# Patient Record
Sex: Female | Born: 1935 | Race: White | Hispanic: No | Smoking: Never smoker
Health system: Southern US, Community
[De-identification: ages and names within clinical notes are randomized; demographics above are authoritative.]

## PROBLEM LIST (undated history)

## (undated) DIAGNOSIS — Z8673 Personal history of transient ischemic attack (TIA), and cerebral infarction without residual deficits: Secondary | ICD-10-CM

## (undated) DIAGNOSIS — N393 Stress incontinence (female) (male): Secondary | ICD-10-CM

## (undated) DIAGNOSIS — S2341XA Sprain of ribs, initial encounter: Secondary | ICD-10-CM

## (undated) DIAGNOSIS — I951 Orthostatic hypotension: Secondary | ICD-10-CM

## (undated) DIAGNOSIS — E785 Hyperlipidemia, unspecified: Secondary | ICD-10-CM

## (undated) DIAGNOSIS — F0390 Unspecified dementia without behavioral disturbance: Secondary | ICD-10-CM

## (undated) DIAGNOSIS — F0280 Dementia in other diseases classified elsewhere without behavioral disturbance: Secondary | ICD-10-CM

## (undated) DIAGNOSIS — M25519 Pain in unspecified shoulder: Secondary | ICD-10-CM

## (undated) DIAGNOSIS — G3183 Dementia with Lewy bodies: Secondary | ICD-10-CM

## (undated) DIAGNOSIS — R5381 Other malaise: Secondary | ICD-10-CM

## (undated) DIAGNOSIS — F05 Delirium due to known physiological condition: Secondary | ICD-10-CM

## (undated) DIAGNOSIS — M1990 Unspecified osteoarthritis, unspecified site: Secondary | ICD-10-CM

## (undated) DIAGNOSIS — Z9181 History of falling: Secondary | ICD-10-CM

## (undated) DIAGNOSIS — G309 Alzheimer's disease, unspecified: Secondary | ICD-10-CM

## (undated) DIAGNOSIS — R609 Edema, unspecified: Secondary | ICD-10-CM

## (undated) DIAGNOSIS — E669 Obesity, unspecified: Secondary | ICD-10-CM

## (undated) DIAGNOSIS — R350 Frequency of micturition: Secondary | ICD-10-CM

## (undated) DIAGNOSIS — M810 Age-related osteoporosis without current pathological fracture: Secondary | ICD-10-CM

## (undated) DIAGNOSIS — E7800 Pure hypercholesterolemia, unspecified: Secondary | ICD-10-CM

## (undated) DIAGNOSIS — R269 Unspecified abnormalities of gait and mobility: Secondary | ICD-10-CM

## (undated) DIAGNOSIS — M25559 Pain in unspecified hip: Secondary | ICD-10-CM

## (undated) DIAGNOSIS — I341 Nonrheumatic mitral (valve) prolapse: Secondary | ICD-10-CM

## (undated) DIAGNOSIS — L03039 Cellulitis of unspecified toe: Secondary | ICD-10-CM

## (undated) DIAGNOSIS — M62838 Other muscle spasm: Secondary | ICD-10-CM

## (undated) DIAGNOSIS — R197 Diarrhea, unspecified: Secondary | ICD-10-CM

## (undated) DIAGNOSIS — K5900 Constipation, unspecified: Secondary | ICD-10-CM

## (undated) DIAGNOSIS — I10 Essential (primary) hypertension: Secondary | ICD-10-CM

## (undated) DIAGNOSIS — F329 Major depressive disorder, single episode, unspecified: Secondary | ICD-10-CM

## (undated) DIAGNOSIS — R5383 Other fatigue: Secondary | ICD-10-CM

## (undated) DIAGNOSIS — K449 Diaphragmatic hernia without obstruction or gangrene: Secondary | ICD-10-CM

## (undated) HISTORY — DX: Edema, unspecified: R60.9

## (undated) HISTORY — DX: Major depressive disorder, single episode, unspecified: F32.9

## (undated) HISTORY — DX: Constipation, unspecified: K59.00

## (undated) HISTORY — DX: Personal history of transient ischemic attack (TIA), and cerebral infarction without residual deficits: Z86.73

## (undated) HISTORY — DX: Obesity, unspecified: E66.9

## (undated) HISTORY — DX: Diaphragmatic hernia without obstruction or gangrene: K44.9

## (undated) HISTORY — DX: Age-related osteoporosis without current pathological fracture: M81.0

## (undated) HISTORY — DX: Other fatigue: R53.83

## (undated) HISTORY — DX: Pain in unspecified hip: M25.559

## (undated) HISTORY — PX: TONSILLECTOMY: SUR1361

## (undated) HISTORY — DX: Unspecified osteoarthritis, unspecified site: M19.90

## (undated) HISTORY — DX: History of falling: Z91.81

## (undated) HISTORY — PX: JOINT REPLACEMENT: SHX530

## (undated) HISTORY — DX: Unspecified abnormalities of gait and mobility: R26.9

## (undated) HISTORY — DX: Alzheimer's disease, unspecified: G30.9

## (undated) HISTORY — DX: Cellulitis of unspecified toe: L03.039

## (undated) HISTORY — DX: Dementia in other diseases classified elsewhere, unspecified severity, without behavioral disturbance, psychotic disturbance, mood disturbance, and anxiety: F02.80

## (undated) HISTORY — DX: Hyperlipidemia, unspecified: E78.5

## (undated) HISTORY — DX: Other malaise: R53.81

## (undated) HISTORY — DX: Stress incontinence (female) (male): N39.3

## (undated) HISTORY — DX: Diarrhea, unspecified: R19.7

## (undated) HISTORY — DX: Delirium due to known physiological condition: F05

## (undated) HISTORY — DX: Other muscle spasm: M62.838

## (undated) HISTORY — DX: Pain in unspecified shoulder: M25.519

## (undated) HISTORY — DX: Frequency of micturition: R35.0

## (undated) HISTORY — DX: Orthostatic hypotension: I95.1

## (undated) HISTORY — DX: Sprain of ribs, initial encounter: S23.41XA

---

## 1996-10-06 HISTORY — PX: ANKLE SURGERY: SHX546

## 2000-10-06 HISTORY — PX: TOTAL KNEE ARTHROPLASTY: SHX125

## 2003-10-07 HISTORY — PX: TOTAL KNEE ARTHROPLASTY: SHX125

## 2011-06-12 ENCOUNTER — Other Ambulatory Visit: Payer: Self-pay | Admitting: Internal Medicine

## 2011-06-12 ENCOUNTER — Ambulatory Visit (HOSPITAL_COMMUNITY)
Admission: RE | Admit: 2011-06-12 | Discharge: 2011-06-12 | Disposition: A | Discharge status: Home / Self Care | Payer: Medicare Other | Source: Ambulatory Visit | Attending: Internal Medicine | Admitting: Internal Medicine

## 2011-06-12 DIAGNOSIS — M25519 Pain in unspecified shoulder: Secondary | ICD-10-CM

## 2011-06-12 DIAGNOSIS — M25559 Pain in unspecified hip: Secondary | ICD-10-CM

## 2011-06-16 ENCOUNTER — Emergency Department (HOSPITAL_COMMUNITY)
Admission: EM | Admit: 2011-06-16 | Discharge: 2011-06-17 | Disposition: A | Discharge status: Home / Self Care | Payer: Medicare Other | Attending: Emergency Medicine | Admitting: Emergency Medicine

## 2011-06-16 ENCOUNTER — Emergency Department (HOSPITAL_COMMUNITY): Payer: Medicare Other

## 2011-06-16 DIAGNOSIS — F0390 Unspecified dementia without behavioral disturbance: Secondary | ICD-10-CM | POA: Insufficient documentation

## 2011-06-16 DIAGNOSIS — M79609 Pain in unspecified limb: Secondary | ICD-10-CM | POA: Insufficient documentation

## 2011-06-16 DIAGNOSIS — Z8673 Personal history of transient ischemic attack (TIA), and cerebral infarction without residual deficits: Secondary | ICD-10-CM | POA: Insufficient documentation

## 2011-06-16 DIAGNOSIS — I4949 Other premature depolarization: Secondary | ICD-10-CM | POA: Insufficient documentation

## 2011-06-16 DIAGNOSIS — K219 Gastro-esophageal reflux disease without esophagitis: Secondary | ICD-10-CM | POA: Insufficient documentation

## 2011-06-16 DIAGNOSIS — R209 Unspecified disturbances of skin sensation: Secondary | ICD-10-CM | POA: Insufficient documentation

## 2011-06-16 DIAGNOSIS — E7800 Pure hypercholesterolemia, unspecified: Secondary | ICD-10-CM | POA: Insufficient documentation

## 2011-06-16 DIAGNOSIS — R29898 Other symptoms and signs involving the musculoskeletal system: Secondary | ICD-10-CM | POA: Insufficient documentation

## 2011-06-17 LAB — COMPREHENSIVE METABOLIC PANEL
Albumin: 3.4 g/dL — ABNORMAL LOW (ref 3.5–5.2)
BUN: 15 mg/dL (ref 6–23)
Calcium: 9.5 mg/dL (ref 8.4–10.5)
Chloride: 99 mEq/L (ref 96–112)
Creatinine, Ser: 0.97 mg/dL (ref 0.50–1.10)
GFR calc non Af Amer: 55 mL/min — ABNORMAL LOW (ref >60)
Total Bilirubin: 0.4 mg/dL (ref 0.3–1.2)

## 2011-06-17 LAB — CBC
MCHC: 34.7 g/dL (ref 30.0–36.0)
RDW: 12 % (ref 11.5–15.5)
WBC: 6.9 10*3/uL (ref 4.0–10.5)

## 2011-06-17 LAB — DIFFERENTIAL
Basophils Absolute: 0 10*3/uL (ref 0.0–0.1)
Basophils Relative: 0 % (ref 0–1)
Eosinophils Relative: 3 % (ref 0–5)
Monocytes Absolute: 0.7 10*3/uL (ref 0.1–1.0)
Neutro Abs: 3.6 10*3/uL (ref 1.7–7.7)

## 2011-06-17 LAB — PROTIME-INR
INR: 0.95 (ref 0.00–1.49)
Prothrombin Time: 12.9 seconds (ref 11.6–15.2)

## 2011-06-17 LAB — APTT: aPTT: 28 seconds (ref 24–37)

## 2011-06-19 ENCOUNTER — Other Ambulatory Visit: Payer: Self-pay | Admitting: Internal Medicine

## 2011-06-19 DIAGNOSIS — R42 Dizziness and giddiness: Secondary | ICD-10-CM

## 2011-06-19 DIAGNOSIS — R2681 Unsteadiness on feet: Secondary | ICD-10-CM

## 2011-06-20 ENCOUNTER — Ambulatory Visit
Admission: RE | Admit: 2011-06-20 | Discharge: 2011-06-20 | Disposition: A | Discharge status: Home / Self Care | Payer: Medicare Other | Source: Ambulatory Visit | Attending: Internal Medicine | Admitting: Internal Medicine

## 2011-06-20 DIAGNOSIS — R42 Dizziness and giddiness: Secondary | ICD-10-CM

## 2011-06-20 DIAGNOSIS — R2681 Unsteadiness on feet: Secondary | ICD-10-CM

## 2011-08-22 ENCOUNTER — Encounter: Payer: Self-pay | Admitting: Emergency Medicine

## 2011-08-22 ENCOUNTER — Emergency Department (HOSPITAL_COMMUNITY): Payer: Medicare Other

## 2011-08-22 ENCOUNTER — Emergency Department (HOSPITAL_COMMUNITY)
Admission: EM | Admit: 2011-08-22 | Discharge: 2011-08-22 | Disposition: A | Discharge status: Home / Self Care | Payer: Medicare Other | Attending: Emergency Medicine | Admitting: Emergency Medicine

## 2011-08-22 DIAGNOSIS — R0602 Shortness of breath: Secondary | ICD-10-CM | POA: Insufficient documentation

## 2011-08-22 DIAGNOSIS — T1490XA Injury, unspecified, initial encounter: Secondary | ICD-10-CM | POA: Insufficient documentation

## 2011-08-22 DIAGNOSIS — R5381 Other malaise: Secondary | ICD-10-CM | POA: Insufficient documentation

## 2011-08-22 DIAGNOSIS — W19XXXA Unspecified fall, initial encounter: Secondary | ICD-10-CM

## 2011-08-22 DIAGNOSIS — R059 Cough, unspecified: Secondary | ICD-10-CM | POA: Insufficient documentation

## 2011-08-22 DIAGNOSIS — R05 Cough: Secondary | ICD-10-CM | POA: Insufficient documentation

## 2011-08-22 DIAGNOSIS — M7989 Other specified soft tissue disorders: Secondary | ICD-10-CM | POA: Insufficient documentation

## 2011-08-22 DIAGNOSIS — I10 Essential (primary) hypertension: Secondary | ICD-10-CM | POA: Insufficient documentation

## 2011-08-22 DIAGNOSIS — F29 Unspecified psychosis not due to a substance or known physiological condition: Secondary | ICD-10-CM | POA: Insufficient documentation

## 2011-08-22 DIAGNOSIS — M25569 Pain in unspecified knee: Secondary | ICD-10-CM | POA: Insufficient documentation

## 2011-08-22 DIAGNOSIS — M25559 Pain in unspecified hip: Secondary | ICD-10-CM | POA: Insufficient documentation

## 2011-08-22 DIAGNOSIS — M899 Disorder of bone, unspecified: Secondary | ICD-10-CM | POA: Insufficient documentation

## 2011-08-22 DIAGNOSIS — M949 Disorder of cartilage, unspecified: Secondary | ICD-10-CM | POA: Insufficient documentation

## 2011-08-22 DIAGNOSIS — R011 Cardiac murmur, unspecified: Secondary | ICD-10-CM | POA: Insufficient documentation

## 2011-08-22 DIAGNOSIS — E860 Dehydration: Secondary | ICD-10-CM

## 2011-08-22 DIAGNOSIS — F0390 Unspecified dementia without behavioral disturbance: Secondary | ICD-10-CM | POA: Insufficient documentation

## 2011-08-22 DIAGNOSIS — R296 Repeated falls: Secondary | ICD-10-CM | POA: Insufficient documentation

## 2011-08-22 HISTORY — DX: Unspecified dementia, unspecified severity, without behavioral disturbance, psychotic disturbance, mood disturbance, and anxiety: F03.90

## 2011-08-22 HISTORY — DX: Nonrheumatic mitral (valve) prolapse: I34.1

## 2011-08-22 HISTORY — DX: Pure hypercholesterolemia, unspecified: E78.00

## 2011-08-22 HISTORY — DX: Essential (primary) hypertension: I10

## 2011-08-22 LAB — DIFFERENTIAL
Lymphocytes Relative: 16 % (ref 12–46)
Lymphs Abs: 1.1 10*3/uL (ref 0.7–4.0)
Neutro Abs: 5.1 10*3/uL (ref 1.7–7.7)
Neutrophils Relative %: 72 % (ref 43–77)

## 2011-08-22 LAB — COMPREHENSIVE METABOLIC PANEL
ALT: 15 U/L (ref 0–35)
Alkaline Phosphatase: 51 U/L (ref 39–117)
CO2: 25 mEq/L (ref 19–32)
Chloride: 102 mEq/L (ref 96–112)
GFR calc Af Amer: 66 mL/min — ABNORMAL LOW (ref >90)
Glucose, Bld: 85 mg/dL (ref 70–99)
Potassium: 3.9 mEq/L (ref 3.5–5.1)
Sodium: 138 mEq/L (ref 135–145)
Total Protein: 7 g/dL (ref 6.0–8.3)

## 2011-08-22 LAB — CBC
Platelets: 167 10*3/uL (ref 150–400)
RBC: 4.18 MIL/uL (ref 3.87–5.11)
WBC: 7.1 10*3/uL (ref 4.0–10.5)

## 2011-08-22 LAB — URINALYSIS, ROUTINE W REFLEX MICROSCOPIC
Bilirubin Urine: NEGATIVE
Ketones, ur: NEGATIVE mg/dL
Nitrite: NEGATIVE
pH: 7.5 (ref 5.0–8.0)

## 2011-08-22 MED ORDER — SODIUM CHLORIDE 0.9 % IV BOLUS (SEPSIS)
1000.0000 mL | Freq: Once | INTRAVENOUS | Status: AC
  Administered 2011-08-22: 1000 mL via INTRAVENOUS

## 2011-08-22 NOTE — ED Notes (Signed)
Talked with family reguarding home safety, additional tips and things that they can consider doing to increase their ease of caring for her mother

## 2011-08-22 NOTE — ED Notes (Signed)
NUU:[id number]<GU> Expected date:08/22/11<BR> Expected time: 2:55 PM<BR> Means of arrival:Ambulance<BR> Comments:<BR> fall

## 2011-08-22 NOTE — Discharge Planning (Signed)
Also with bcbs coverage

## 2011-08-22 NOTE — Discharge Planning (Signed)
[facility] ED CM contacted by EDP, Plunkett to see daughter who is interested in home services.  Spoke with pt and son in Social worker.  Daughter stepped out of room.  Husband described wife needing respite services in [facility].  CM offered a list of private duty nursing service list. Left for daughter in room.  States has not had home health services previously referred to pcp to assist with home health if needed.  Left list of [place] [facility] agencies. Dr Anitra Lauth updated Pt with medicare coverage No medicaid coverage.

## 2011-08-22 NOTE — ED Notes (Signed)
1st attempt to obtain labs pt unavailable

## 2011-08-22 NOTE — ED Notes (Signed)
ivf fluids placed on pump and rate increased.  Case manager in to see family and give list for agencies that could help

## 2011-08-22 NOTE — Discharge Planning (Signed)
Daughter, [name], confirms pt has someone to stay with her once a week but need an alternative person when this sitter is working.  Referred her to private duty nursing list.  Also provided [facility] health list to have her to discuss with pcp if PT/OT needed. Explained to daughter differences in Home health and private duty especially that home health providers do not stay in home for long periods of time Other questions answered about insurance coverage.  Voiced understanding and appreciation. ED CM provided CM business card for further questions.

## 2011-08-22 NOTE — ED Provider Notes (Signed)
History     CSN: [id number] Arrival date & time: 08/22/2011  3:03 PM   First MD Initiated Contact with Patient 08/22/11 1521      Chief Complaint  Patient presents with  . Fall    fall last night, without injury. 2 additional "almost falls" today    (Consider location/radiation/quality/duration/timing/severity/associated sxs/prior treatment) HPI Comments: Daughter states three falls since 3am and her legs just give out.  Patient is a 77 y.o. female presenting with fall. The history is provided by a relative.  Fall The accident occurred 6 to 12 hours ago. The fall occurred while walking and while standing. She fell from a height of 1 to 2 ft. She landed on carpet. There was no blood loss. The point of impact was the right hip and left knee. The pain is present in the right hip and left knee. The pain is at a severity of 1/10. The pain is mild. She was ambulatory at the scene (but limping). Pertinent negatives include no fever, no numbness, no bowel incontinence, no nausea, no vomiting, no headaches and no loss of consciousness. The symptoms are aggravated by activity. She has tried nothing for the symptoms.    Past Medical History  Diagnosis Date  . Hypertension   . High cholesterol   . Mitral valve prolapse   . Dementia     Past Surgical History  Procedure Date  . Joint replacement   . Tonsillectomy     No family history on file.  History  Substance Use Topics  . Smoking status: Never Smoker   . Smokeless tobacco: Not on file  . Alcohol Use: No    OB History    Grav Para Term Preterm Abortions TAB SAB Ect Mult Living                  Review of Systems  Constitutional: Negative for fever.  Respiratory: Negative for cough and shortness of breath.   Gastrointestinal: Negative for nausea, vomiting and bowel incontinence.  Neurological: Positive for weakness. Negative for dizziness, loss of consciousness, numbness and headaches.  Psychiatric/Behavioral: Positive  for confusion.    Allergies  Sulfa antibiotics  Home Medications  No current outpatient prescriptions on file.  BP 157/77  Pulse 63  Temp(Src) 98.7 F (37.1 C) (Oral)  Resp 18  Ht 5\' 3"  (1.6 m)  Wt 190 lb (86.183 kg)  BMI 33.66 kg/m2  SpO2 96%  LMP 08/22/2011  Physical Exam  Nursing note and vitals reviewed. Constitutional: She is oriented to person, place, and time. She appears well-developed and well-nourished. No distress.  HENT:  Head: Normocephalic and atraumatic.  Eyes: EOM are normal. Pupils are equal, round, and reactive to light.  Cardiovascular: Normal rate, regular rhythm and intact distal pulses.  Exam reveals no friction rub.   Murmur heard.  Crescendo systolic murmur is present with a grade of 3/6  Pulmonary/Chest: Effort normal and breath sounds normal. She has no wheezes. She has no rales.  Abdominal: Soft. Bowel sounds are normal. She exhibits no distension. There is no tenderness. There is no rebound and no guarding.  Musculoskeletal: She exhibits tenderness.       Right hip: She exhibits decreased range of motion and tenderness. She exhibits normal strength.       Left knee: She exhibits swelling and ecchymosis. She exhibits normal range of motion.       No edema  Neurological: She is alert and oriented to person, place, and time. No cranial nerve  deficit.  Skin: Skin is warm and dry. No rash noted.  Psychiatric: She has a normal mood and affect. Her behavior is normal.       Dementia at baseline per daughter    ED Course  Procedures (including critical care time)   Labs Reviewed  CBC  DIFFERENTIAL  COMPREHENSIVE METABOLIC PANEL  URINALYSIS, ROUTINE W REFLEX MICROSCOPIC   No results found.   No diagnosis found.    MDM   Pt with hx of lewy body dementia and recently had 3 falls today where it sounds like she gets weak and her legs give out.  Daughter states thinks she may be dehydrated due to not drinking well the last few days and  possibly a UTI which she states she has been dehydrated in the past with episodes like these.  Deny any new medication and no fever but states maybe slightly more confusion. Will check CBC, CMP, UA, plain films of the hip and knee due to pain after the fall.  8:02 PM All labs wnl.  On re-evaluation pt getting fluids and feeling better.  Family at bedside and are comfortable taking her home and will have case manager speak with her about more help at home.  Pt is awake and alert and in no distress.      Gwyneth Sprout, MD 08/22/11 2224

## 2011-08-23 ENCOUNTER — Emergency Department (HOSPITAL_COMMUNITY)
Admission: EM | Admit: 2011-08-23 | Discharge: 2011-08-23 | Disposition: A | Discharge status: Home / Self Care | Payer: Medicare Other | Attending: Emergency Medicine | Admitting: Emergency Medicine

## 2011-08-23 ENCOUNTER — Encounter (HOSPITAL_COMMUNITY): Payer: Self-pay | Admitting: *Deleted

## 2011-08-23 DIAGNOSIS — R609 Edema, unspecified: Secondary | ICD-10-CM | POA: Insufficient documentation

## 2011-08-23 DIAGNOSIS — IMO0002 Reserved for concepts with insufficient information to code with codable children: Secondary | ICD-10-CM | POA: Insufficient documentation

## 2011-08-23 DIAGNOSIS — Z79899 Other long term (current) drug therapy: Secondary | ICD-10-CM | POA: Insufficient documentation

## 2011-08-23 DIAGNOSIS — E7800 Pure hypercholesterolemia, unspecified: Secondary | ICD-10-CM | POA: Insufficient documentation

## 2011-08-23 DIAGNOSIS — F02818 Dementia in other diseases classified elsewhere, unspecified severity, with other behavioral disturbance: Secondary | ICD-10-CM | POA: Insufficient documentation

## 2011-08-23 DIAGNOSIS — F0280 Dementia in other diseases classified elsewhere without behavioral disturbance: Secondary | ICD-10-CM | POA: Insufficient documentation

## 2011-08-23 DIAGNOSIS — I10 Essential (primary) hypertension: Secondary | ICD-10-CM | POA: Insufficient documentation

## 2011-08-23 DIAGNOSIS — F0281 Dementia in other diseases classified elsewhere with behavioral disturbance: Secondary | ICD-10-CM | POA: Insufficient documentation

## 2011-08-23 DIAGNOSIS — M7989 Other specified soft tissue disorders: Secondary | ICD-10-CM | POA: Insufficient documentation

## 2011-08-23 DIAGNOSIS — W19XXXA Unspecified fall, initial encounter: Secondary | ICD-10-CM | POA: Insufficient documentation

## 2011-08-23 HISTORY — DX: Dementia in other diseases classified elsewhere, unspecified severity, without behavioral disturbance, psychotic disturbance, mood disturbance, and anxiety: G31.83

## 2011-08-23 NOTE — ED Notes (Signed)
Pt returns today due to redness on left arm from IV/Turniquet site, received fluids yesterday, requesting check due to redness

## 2011-08-24 NOTE — ED Provider Notes (Signed)
History     CSN: [id number] Arrival date & time: 08/23/2011  5:41 PM   First MD Initiated Contact with Patient 08/23/11 1850      Chief Complaint  Patient presents with  . Arm Problem    (Consider location/radiation/quality/duration/timing/severity/associated sxs/prior treatment) Patient is a 77 y.o. female presenting with wound check. The history is provided by a relative. The history is limited by the condition of the patient. No language interpreter was used.  Wound Check  She was treated in the ED yesterday. There has been no drainage from the wound. There is new redness present. There is new swelling present. The pain has no pain. She has no difficulty moving the affected extremity or digit.  Patient was here yesterday and received IV fluids for dehydration after a fall.  Labs were unremarkable.  Here today with concern of the area of her arm that the turniquet had been applied and hand swelling from the IV.   Past Medical History  Diagnosis Date  . Hypertension   . High cholesterol   . Mitral valve prolapse   . Dementia   . Dementia   . Parkinson's disease, Lewy body     Past Surgical History  Procedure Date  . Joint replacement   . Tonsillectomy     No family history on file.  History  Substance Use Topics  . Smoking status: Never Smoker   . Smokeless tobacco: Not on file  . Alcohol Use: No    OB History    Grav Para Term Preterm Abortions TAB SAB Ect Mult Living                  Review of Systems  All other systems reviewed and are negative.    Allergies  Sulfa antibiotics  Home Medications   Current Outpatient Rx  Name Route Sig Dispense Refill  . ACETAMINOPHEN ER 650 MG PO TBCR Oral Take 650 mg by mouth 2 (two) times daily.      Marland Kitchen VITAMIN C-ROSE HIPS 1000 MG PO TABS Oral Take 1,000 mg by mouth daily.      . SUPER B-COMPLEX PO Oral Take 1 tablet by mouth every morning.      Marland Kitchen CITRACAL CALCIUM+D PO Oral Take 1-2 tablets by mouth 2 (two) times  daily. Take 1 tab in the morning and 2 tabs in the evening. (Citracal with 500 IU Vitamin D, 400 mg Calcium, and 5 mg Sodium)     . CITALOPRAM HYDROBROMIDE 20 MG PO TABS Oral Take 20 mg by mouth every evening.      Marland Kitchen CLOPIDOGREL BISULFATE 75 MG PO TABS Oral Take 75 mg by mouth every morning.      Marland Kitchen DARIFENACIN HYDROBROMIDE 7.5 MG PO TB24 Oral Take 7.5 mg by mouth every evening.      Marland Kitchen LISINOPRIL 20 MG PO TABS Oral Take 20 mg by mouth every morning.      Marland Kitchen MEMANTINE HCL 10 MG PO TABS Oral Take 10 mg by mouth 2 (two) times daily.      . CENTRUM SILVER ULTRA WOMENS PO Oral Take 1 tablet by mouth every morning.      Marland Kitchen OMEGA-3-ACID ETHYL ESTERS 1 G PO CAPS Oral Take 2 g by mouth 2 (two) times daily.      Marland Kitchen OMEPRAZOLE 20 MG PO CPDR Oral Take 20 mg by mouth daily.      Marland Kitchen POTASSIUM GLUCONATE 595 MG PO TABS Oral Take 595 mg by mouth 2 (two)  times daily.      Marland Kitchen RIVASTIGMINE 4.6 MG/24HR TD PT24 Transdermal Place 1 patch onto the skin daily.      Marland Kitchen ROSUVASTATIN CALCIUM 10 MG PO TABS Oral Take 10 mg by mouth at bedtime.        BP 140/70  Pulse 61  Temp(Src) 98.6 F (37 C) (Oral)  Resp 18  Wt 190 lb (86.183 kg)  SpO2 97%  LMP 08/22/2011  Physical Exam  Nursing note and vitals reviewed. Constitutional: She appears well-developed and well-nourished.  Eyes: Pupils are equal, round, and reactive to light.  Cardiovascular: Normal rate.   Pulmonary/Chest: Effort normal and breath sounds normal.  Abdominal: Soft.  Musculoskeletal: Normal range of motion. She exhibits edema. She exhibits no tenderness.  Neurological: She is alert.  Skin: Skin is warm and dry. No rash noted. No erythema.  Psychiatric: She has a normal mood and affect.       Pt with dementia daughter at bedside    ED Course  Procedures (including critical care time)  Labs Reviewed - No data to display Dg Chest 2 View  08/22/2011  *RADIOLOGY REPORT*  Clinical Data: Shortness of breath, cough  CHEST - 2 VIEW  Comparison: None.   Findings: Aorta is ectatic and unfolded.  Lung fields are clear. No pleural effusion. Bones are osteopenic, which may mask subtle fracture.  The thoracic spine is not well evaluated in its mid and inferior aspects to the osteopenia and overlying soft tissue.  No visualized compression deformity otherwise.  Left shoulder degenerative change.  Heart size is upper limits of normal.  IMPRESSION: No acute cardiopulmonary process.  Original Report Authenticated By: Harrel Lemon, M.D.   Dg Hip Complete Right  08/22/2011  *RADIOLOGY REPORT*  Clinical Data: Recurrent falls.  Right hip injury and pain.  RIGHT HIP - COMPLETE 2+ VIEW  Comparison: None.  Findings: No evidence of fracture or dislocation. Mild degenerative spurring of the acetabulum noted. Degenerative sclerosis of the pubic symphysis is demonstrated, as well as advanced lower lumbar spine degenerative changes.  IMPRESSION:  1.  No acute findings. 2.  Degenerative changes, as described above.  Original Report Authenticated By: Danae Orleans, M.D.   Dg Knee Complete 4 Views Left  08/22/2011  *RADIOLOGY REPORT*  Clinical Data: Recurrent falls.  Knee injury and pain.  LEFT KNEE - COMPLETE 4+ VIEW  Comparison: None.  Findings: Previous total knee arthroplasty is seen, with all three components in expected position.  No evidence of fracture or dislocation.  No evidence of knee joint effusion.  Generalized osteopenia is noted.  No other bone abnormality identified.  IMPRESSION:  1.  No acute findings. 2.  Osteopenia.  Original Report Authenticated By: Danae Orleans, M.D.     1. Abrasion and/or friction burn   2. Hand swelling       MDM  Here to have her arm and hand checked from where the IV turniquet was applied yesterday in the ER after being treated with IV fluids for dehydration. Patient is demented but appears in no pain. LUE with obvious inflamation from the tourniquet application yesterday and L hand swelling from the IV fluids.  No  apartent pain with a good radial pulse.  Daughter reassured and is comfortable with taking her home.  Will follow up Monday with PCP.        Jethro Bastos, NP 08/24/11 1041

## 2011-08-25 NOTE — ED Provider Notes (Signed)
Medical screening examination/treatment/procedure(s) were conducted as a shared visit with non-physician practitioner(s) and myself.  I personally evaluated the patient during the encounter Elderly F presenting one day after recent ED treatment w concerns of UE pain.  PE notable for edema, no distress, no e/o extremity compromise.  D/C home.  Gerhard Munch, MD 08/25/11 [phone]

## 2011-08-26 ENCOUNTER — Ambulatory Visit
Admission: RE | Admit: 2011-08-26 | Discharge: 2011-08-26 | Disposition: A | Discharge status: Home / Self Care | Payer: Medicare Other | Source: Ambulatory Visit | Attending: Internal Medicine | Admitting: Internal Medicine

## 2011-08-26 ENCOUNTER — Other Ambulatory Visit: Payer: Self-pay | Admitting: Internal Medicine

## 2011-08-26 DIAGNOSIS — R52 Pain, unspecified: Secondary | ICD-10-CM

## 2011-08-26 DIAGNOSIS — W19XXXA Unspecified fall, initial encounter: Secondary | ICD-10-CM

## 2011-08-29 DIAGNOSIS — F0280 Dementia in other diseases classified elsewhere without behavioral disturbance: Secondary | ICD-10-CM | POA: Diagnosis not present

## 2011-08-29 DIAGNOSIS — R262 Difficulty in walking, not elsewhere classified: Secondary | ICD-10-CM | POA: Diagnosis not present

## 2011-08-29 DIAGNOSIS — Z9181 History of falling: Secondary | ICD-10-CM | POA: Diagnosis not present

## 2011-08-29 DIAGNOSIS — I10 Essential (primary) hypertension: Secondary | ICD-10-CM | POA: Diagnosis not present

## 2011-08-29 DIAGNOSIS — M6281 Muscle weakness (generalized): Secondary | ICD-10-CM | POA: Diagnosis not present

## 2011-09-02 DIAGNOSIS — Z9181 History of falling: Secondary | ICD-10-CM | POA: Diagnosis not present

## 2011-09-02 DIAGNOSIS — F0280 Dementia in other diseases classified elsewhere without behavioral disturbance: Secondary | ICD-10-CM | POA: Diagnosis not present

## 2011-09-02 DIAGNOSIS — R262 Difficulty in walking, not elsewhere classified: Secondary | ICD-10-CM | POA: Diagnosis not present

## 2011-09-02 DIAGNOSIS — M6281 Muscle weakness (generalized): Secondary | ICD-10-CM | POA: Diagnosis not present

## 2011-09-02 DIAGNOSIS — I10 Essential (primary) hypertension: Secondary | ICD-10-CM | POA: Diagnosis not present

## 2011-09-03 DIAGNOSIS — M6281 Muscle weakness (generalized): Secondary | ICD-10-CM | POA: Diagnosis not present

## 2011-09-03 DIAGNOSIS — G309 Alzheimer's disease, unspecified: Secondary | ICD-10-CM | POA: Diagnosis not present

## 2011-09-03 DIAGNOSIS — R262 Difficulty in walking, not elsewhere classified: Secondary | ICD-10-CM | POA: Diagnosis not present

## 2011-09-03 DIAGNOSIS — Z9181 History of falling: Secondary | ICD-10-CM | POA: Diagnosis not present

## 2011-09-03 DIAGNOSIS — I10 Essential (primary) hypertension: Secondary | ICD-10-CM | POA: Diagnosis not present

## 2011-09-04 ENCOUNTER — Ambulatory Visit
Admission: RE | Admit: 2011-09-04 | Discharge: 2011-09-04 | Disposition: A | Discharge status: Home / Self Care | Payer: Medicare Other | Source: Ambulatory Visit | Attending: Internal Medicine | Admitting: Internal Medicine

## 2011-09-04 ENCOUNTER — Other Ambulatory Visit: Payer: Self-pay | Admitting: Internal Medicine

## 2011-09-04 DIAGNOSIS — R52 Pain, unspecified: Secondary | ICD-10-CM

## 2011-09-05 DIAGNOSIS — I10 Essential (primary) hypertension: Secondary | ICD-10-CM | POA: Diagnosis not present

## 2011-09-05 DIAGNOSIS — M6281 Muscle weakness (generalized): Secondary | ICD-10-CM | POA: Diagnosis not present

## 2011-09-05 DIAGNOSIS — G309 Alzheimer's disease, unspecified: Secondary | ICD-10-CM | POA: Diagnosis not present

## 2011-09-05 DIAGNOSIS — R262 Difficulty in walking, not elsewhere classified: Secondary | ICD-10-CM | POA: Diagnosis not present

## 2011-09-05 DIAGNOSIS — Z9181 History of falling: Secondary | ICD-10-CM | POA: Diagnosis not present

## 2011-09-09 DIAGNOSIS — M6281 Muscle weakness (generalized): Secondary | ICD-10-CM | POA: Diagnosis not present

## 2011-09-09 DIAGNOSIS — R262 Difficulty in walking, not elsewhere classified: Secondary | ICD-10-CM | POA: Diagnosis not present

## 2011-09-09 DIAGNOSIS — G309 Alzheimer's disease, unspecified: Secondary | ICD-10-CM | POA: Diagnosis not present

## 2011-09-09 DIAGNOSIS — I10 Essential (primary) hypertension: Secondary | ICD-10-CM | POA: Diagnosis not present

## 2011-09-09 DIAGNOSIS — Z9181 History of falling: Secondary | ICD-10-CM | POA: Diagnosis not present

## 2011-09-11 DIAGNOSIS — M6281 Muscle weakness (generalized): Secondary | ICD-10-CM | POA: Diagnosis not present

## 2011-09-11 DIAGNOSIS — Z9181 History of falling: Secondary | ICD-10-CM | POA: Diagnosis not present

## 2011-09-11 DIAGNOSIS — I10 Essential (primary) hypertension: Secondary | ICD-10-CM | POA: Diagnosis not present

## 2011-09-11 DIAGNOSIS — R262 Difficulty in walking, not elsewhere classified: Secondary | ICD-10-CM | POA: Diagnosis not present

## 2011-09-11 DIAGNOSIS — G309 Alzheimer's disease, unspecified: Secondary | ICD-10-CM | POA: Diagnosis not present

## 2011-09-13 DIAGNOSIS — F0280 Dementia in other diseases classified elsewhere without behavioral disturbance: Secondary | ICD-10-CM | POA: Diagnosis not present

## 2011-09-13 DIAGNOSIS — R262 Difficulty in walking, not elsewhere classified: Secondary | ICD-10-CM | POA: Diagnosis not present

## 2011-09-13 DIAGNOSIS — Z9181 History of falling: Secondary | ICD-10-CM | POA: Diagnosis not present

## 2011-09-13 DIAGNOSIS — M6281 Muscle weakness (generalized): Secondary | ICD-10-CM | POA: Diagnosis not present

## 2011-09-13 DIAGNOSIS — I10 Essential (primary) hypertension: Secondary | ICD-10-CM | POA: Diagnosis not present

## 2011-09-15 DIAGNOSIS — I10 Essential (primary) hypertension: Secondary | ICD-10-CM | POA: Diagnosis not present

## 2011-09-15 DIAGNOSIS — Z9181 History of falling: Secondary | ICD-10-CM | POA: Diagnosis not present

## 2011-09-15 DIAGNOSIS — M6281 Muscle weakness (generalized): Secondary | ICD-10-CM | POA: Diagnosis not present

## 2011-09-15 DIAGNOSIS — G309 Alzheimer's disease, unspecified: Secondary | ICD-10-CM | POA: Diagnosis not present

## 2011-09-15 DIAGNOSIS — R262 Difficulty in walking, not elsewhere classified: Secondary | ICD-10-CM | POA: Diagnosis not present

## 2011-09-16 DIAGNOSIS — M6281 Muscle weakness (generalized): Secondary | ICD-10-CM | POA: Diagnosis not present

## 2011-09-16 DIAGNOSIS — R262 Difficulty in walking, not elsewhere classified: Secondary | ICD-10-CM | POA: Diagnosis not present

## 2011-09-16 DIAGNOSIS — G309 Alzheimer's disease, unspecified: Secondary | ICD-10-CM | POA: Diagnosis not present

## 2011-09-16 DIAGNOSIS — Z9181 History of falling: Secondary | ICD-10-CM | POA: Diagnosis not present

## 2011-09-16 DIAGNOSIS — I10 Essential (primary) hypertension: Secondary | ICD-10-CM | POA: Diagnosis not present

## 2011-09-17 DIAGNOSIS — R262 Difficulty in walking, not elsewhere classified: Secondary | ICD-10-CM | POA: Diagnosis not present

## 2011-09-17 DIAGNOSIS — Z9181 History of falling: Secondary | ICD-10-CM | POA: Diagnosis not present

## 2011-09-17 DIAGNOSIS — I10 Essential (primary) hypertension: Secondary | ICD-10-CM | POA: Diagnosis not present

## 2011-09-17 DIAGNOSIS — F0280 Dementia in other diseases classified elsewhere without behavioral disturbance: Secondary | ICD-10-CM | POA: Diagnosis not present

## 2011-09-17 DIAGNOSIS — M6281 Muscle weakness (generalized): Secondary | ICD-10-CM | POA: Diagnosis not present

## 2011-09-18 DIAGNOSIS — M6281 Muscle weakness (generalized): Secondary | ICD-10-CM | POA: Diagnosis not present

## 2011-09-18 DIAGNOSIS — F0280 Dementia in other diseases classified elsewhere without behavioral disturbance: Secondary | ICD-10-CM | POA: Diagnosis not present

## 2011-09-18 DIAGNOSIS — I10 Essential (primary) hypertension: Secondary | ICD-10-CM | POA: Diagnosis not present

## 2011-09-18 DIAGNOSIS — Z9181 History of falling: Secondary | ICD-10-CM | POA: Diagnosis not present

## 2011-09-18 DIAGNOSIS — R262 Difficulty in walking, not elsewhere classified: Secondary | ICD-10-CM | POA: Diagnosis not present

## 2011-09-22 DIAGNOSIS — I10 Essential (primary) hypertension: Secondary | ICD-10-CM | POA: Diagnosis not present

## 2011-09-22 DIAGNOSIS — M6281 Muscle weakness (generalized): Secondary | ICD-10-CM | POA: Diagnosis not present

## 2011-09-22 DIAGNOSIS — Z9181 History of falling: Secondary | ICD-10-CM | POA: Diagnosis not present

## 2011-09-22 DIAGNOSIS — R262 Difficulty in walking, not elsewhere classified: Secondary | ICD-10-CM | POA: Diagnosis not present

## 2011-09-22 DIAGNOSIS — F0280 Dementia in other diseases classified elsewhere without behavioral disturbance: Secondary | ICD-10-CM | POA: Diagnosis not present

## 2011-09-25 DIAGNOSIS — Z9181 History of falling: Secondary | ICD-10-CM | POA: Diagnosis not present

## 2011-09-25 DIAGNOSIS — G309 Alzheimer's disease, unspecified: Secondary | ICD-10-CM | POA: Diagnosis not present

## 2011-09-25 DIAGNOSIS — I10 Essential (primary) hypertension: Secondary | ICD-10-CM | POA: Diagnosis not present

## 2011-09-25 DIAGNOSIS — R262 Difficulty in walking, not elsewhere classified: Secondary | ICD-10-CM | POA: Diagnosis not present

## 2011-09-25 DIAGNOSIS — M6281 Muscle weakness (generalized): Secondary | ICD-10-CM | POA: Diagnosis not present

## 2011-09-26 DIAGNOSIS — R262 Difficulty in walking, not elsewhere classified: Secondary | ICD-10-CM | POA: Diagnosis not present

## 2011-09-26 DIAGNOSIS — I10 Essential (primary) hypertension: Secondary | ICD-10-CM | POA: Diagnosis not present

## 2011-09-26 DIAGNOSIS — F0280 Dementia in other diseases classified elsewhere without behavioral disturbance: Secondary | ICD-10-CM | POA: Diagnosis not present

## 2011-09-26 DIAGNOSIS — Z9181 History of falling: Secondary | ICD-10-CM | POA: Diagnosis not present

## 2011-09-26 DIAGNOSIS — M6281 Muscle weakness (generalized): Secondary | ICD-10-CM | POA: Diagnosis not present

## 2011-10-01 DIAGNOSIS — I10 Essential (primary) hypertension: Secondary | ICD-10-CM | POA: Diagnosis not present

## 2011-10-01 DIAGNOSIS — R262 Difficulty in walking, not elsewhere classified: Secondary | ICD-10-CM | POA: Diagnosis not present

## 2011-10-01 DIAGNOSIS — F0280 Dementia in other diseases classified elsewhere without behavioral disturbance: Secondary | ICD-10-CM | POA: Diagnosis not present

## 2011-10-01 DIAGNOSIS — Z9181 History of falling: Secondary | ICD-10-CM | POA: Diagnosis not present

## 2011-10-01 DIAGNOSIS — M6281 Muscle weakness (generalized): Secondary | ICD-10-CM | POA: Diagnosis not present

## 2011-10-03 DIAGNOSIS — F0280 Dementia in other diseases classified elsewhere without behavioral disturbance: Secondary | ICD-10-CM | POA: Diagnosis not present

## 2011-10-03 DIAGNOSIS — M6281 Muscle weakness (generalized): Secondary | ICD-10-CM | POA: Diagnosis not present

## 2011-10-03 DIAGNOSIS — I10 Essential (primary) hypertension: Secondary | ICD-10-CM | POA: Diagnosis not present

## 2011-10-03 DIAGNOSIS — Z9181 History of falling: Secondary | ICD-10-CM | POA: Diagnosis not present

## 2011-10-03 DIAGNOSIS — R262 Difficulty in walking, not elsewhere classified: Secondary | ICD-10-CM | POA: Diagnosis not present

## 2011-10-04 DIAGNOSIS — M6281 Muscle weakness (generalized): Secondary | ICD-10-CM | POA: Diagnosis not present

## 2011-10-04 DIAGNOSIS — R262 Difficulty in walking, not elsewhere classified: Secondary | ICD-10-CM | POA: Diagnosis not present

## 2011-10-04 DIAGNOSIS — Z9181 History of falling: Secondary | ICD-10-CM | POA: Diagnosis not present

## 2011-10-04 DIAGNOSIS — F0280 Dementia in other diseases classified elsewhere without behavioral disturbance: Secondary | ICD-10-CM | POA: Diagnosis not present

## 2011-10-04 DIAGNOSIS — I10 Essential (primary) hypertension: Secondary | ICD-10-CM | POA: Diagnosis not present

## 2011-10-08 DIAGNOSIS — I10 Essential (primary) hypertension: Secondary | ICD-10-CM | POA: Diagnosis not present

## 2011-10-08 DIAGNOSIS — F0280 Dementia in other diseases classified elsewhere without behavioral disturbance: Secondary | ICD-10-CM | POA: Diagnosis not present

## 2011-10-08 DIAGNOSIS — R262 Difficulty in walking, not elsewhere classified: Secondary | ICD-10-CM | POA: Diagnosis not present

## 2011-10-08 DIAGNOSIS — Z9181 History of falling: Secondary | ICD-10-CM | POA: Diagnosis not present

## 2011-10-08 DIAGNOSIS — M6281 Muscle weakness (generalized): Secondary | ICD-10-CM | POA: Diagnosis not present

## 2011-10-11 DIAGNOSIS — Z9181 History of falling: Secondary | ICD-10-CM | POA: Diagnosis not present

## 2011-10-11 DIAGNOSIS — G309 Alzheimer's disease, unspecified: Secondary | ICD-10-CM | POA: Diagnosis not present

## 2011-10-11 DIAGNOSIS — R262 Difficulty in walking, not elsewhere classified: Secondary | ICD-10-CM | POA: Diagnosis not present

## 2011-10-11 DIAGNOSIS — M6281 Muscle weakness (generalized): Secondary | ICD-10-CM | POA: Diagnosis not present

## 2011-10-11 DIAGNOSIS — I10 Essential (primary) hypertension: Secondary | ICD-10-CM | POA: Diagnosis not present

## 2011-10-14 DIAGNOSIS — F4489 Other dissociative and conversion disorders: Secondary | ICD-10-CM | POA: Diagnosis not present

## 2011-10-14 DIAGNOSIS — R6889 Other general symptoms and signs: Secondary | ICD-10-CM | POA: Diagnosis not present

## 2011-10-15 DIAGNOSIS — R262 Difficulty in walking, not elsewhere classified: Secondary | ICD-10-CM | POA: Diagnosis not present

## 2011-10-15 DIAGNOSIS — F0280 Dementia in other diseases classified elsewhere without behavioral disturbance: Secondary | ICD-10-CM | POA: Diagnosis not present

## 2011-10-15 DIAGNOSIS — I10 Essential (primary) hypertension: Secondary | ICD-10-CM | POA: Diagnosis not present

## 2011-10-15 DIAGNOSIS — M6281 Muscle weakness (generalized): Secondary | ICD-10-CM | POA: Diagnosis not present

## 2011-10-15 DIAGNOSIS — Z9181 History of falling: Secondary | ICD-10-CM | POA: Diagnosis not present

## 2011-10-15 DIAGNOSIS — G309 Alzheimer's disease, unspecified: Secondary | ICD-10-CM | POA: Diagnosis not present

## 2011-10-18 ENCOUNTER — Emergency Department (HOSPITAL_COMMUNITY): Payer: Medicare Other

## 2011-10-18 ENCOUNTER — Encounter (HOSPITAL_COMMUNITY): Payer: Self-pay | Admitting: *Deleted

## 2011-10-18 ENCOUNTER — Emergency Department (HOSPITAL_COMMUNITY)
Admission: EM | Admit: 2011-10-18 | Discharge: 2011-10-18 | Disposition: A | Discharge status: Home / Self Care | Payer: Medicare Other | Attending: Emergency Medicine | Admitting: Emergency Medicine

## 2011-10-18 DIAGNOSIS — R5381 Other malaise: Secondary | ICD-10-CM | POA: Diagnosis not present

## 2011-10-18 DIAGNOSIS — F0390 Unspecified dementia without behavioral disturbance: Secondary | ICD-10-CM | POA: Diagnosis not present

## 2011-10-18 DIAGNOSIS — F0280 Dementia in other diseases classified elsewhere without behavioral disturbance: Secondary | ICD-10-CM | POA: Insufficient documentation

## 2011-10-18 DIAGNOSIS — I10 Essential (primary) hypertension: Secondary | ICD-10-CM | POA: Diagnosis not present

## 2011-10-18 DIAGNOSIS — I69998 Other sequelae following unspecified cerebrovascular disease: Secondary | ICD-10-CM | POA: Diagnosis not present

## 2011-10-18 DIAGNOSIS — Z8673 Personal history of transient ischemic attack (TIA), and cerebral infarction without residual deficits: Secondary | ICD-10-CM | POA: Diagnosis not present

## 2011-10-18 DIAGNOSIS — G20 Parkinson's disease: Secondary | ICD-10-CM | POA: Insufficient documentation

## 2011-10-18 DIAGNOSIS — E7800 Pure hypercholesterolemia, unspecified: Secondary | ICD-10-CM | POA: Diagnosis not present

## 2011-10-18 DIAGNOSIS — R4182 Altered mental status, unspecified: Secondary | ICD-10-CM | POA: Diagnosis not present

## 2011-10-18 DIAGNOSIS — F29 Unspecified psychosis not due to a substance or known physiological condition: Secondary | ICD-10-CM | POA: Insufficient documentation

## 2011-10-18 DIAGNOSIS — N390 Urinary tract infection, site not specified: Secondary | ICD-10-CM | POA: Insufficient documentation

## 2011-10-18 DIAGNOSIS — Z9660 Presence of unspecified orthopedic joint implant: Secondary | ICD-10-CM | POA: Diagnosis not present

## 2011-10-18 DIAGNOSIS — K449 Diaphragmatic hernia without obstruction or gangrene: Secondary | ICD-10-CM | POA: Diagnosis not present

## 2011-10-18 DIAGNOSIS — I517 Cardiomegaly: Secondary | ICD-10-CM | POA: Diagnosis not present

## 2011-10-18 DIAGNOSIS — I059 Rheumatic mitral valve disease, unspecified: Secondary | ICD-10-CM | POA: Diagnosis not present

## 2011-10-18 DIAGNOSIS — I6789 Other cerebrovascular disease: Secondary | ICD-10-CM | POA: Diagnosis not present

## 2011-10-18 DIAGNOSIS — G459 Transient cerebral ischemic attack, unspecified: Secondary | ICD-10-CM | POA: Diagnosis not present

## 2011-10-18 DIAGNOSIS — G20A1 Parkinson's disease without dyskinesia, without mention of fluctuations: Secondary | ICD-10-CM | POA: Insufficient documentation

## 2011-10-18 LAB — URINE CULTURE
Colony Count: 2000
Culture  Setup Time: 201301132118

## 2011-10-18 LAB — DIFFERENTIAL
Basophils Relative: 0 % (ref 0–1)
Lymphs Abs: 1.1 10*3/uL (ref 0.7–4.0)
Monocytes Absolute: 0.7 10*3/uL (ref 0.1–1.0)
Monocytes Relative: 9 % (ref 3–12)
Neutro Abs: 5.9 10*3/uL (ref 1.7–7.7)

## 2011-10-18 LAB — URINALYSIS, ROUTINE W REFLEX MICROSCOPIC
Bilirubin Urine: NEGATIVE
Glucose, UA: NEGATIVE mg/dL
Hgb urine dipstick: NEGATIVE
Ketones, ur: NEGATIVE mg/dL
Nitrite: NEGATIVE
Specific Gravity, Urine: <1.005 — ABNORMAL LOW (ref 1.005–1.030)
pH: 6 (ref 5.0–8.0)

## 2011-10-18 LAB — BASIC METABOLIC PANEL
BUN: 13 mg/dL (ref 6–23)
Chloride: 100 mEq/L (ref 96–112)
Creatinine, Ser: 0.95 mg/dL (ref 0.50–1.10)
GFR calc Af Amer: 63 mL/min — ABNORMAL LOW (ref >90)
Glucose, Bld: 97 mg/dL (ref 70–99)

## 2011-10-18 LAB — CBC
HCT: 39.5 % (ref 36.0–46.0)
Hemoglobin: 13.1 g/dL (ref 12.0–15.0)
MCH: 32.3 pg (ref 26.0–34.0)
MCHC: 33.2 g/dL (ref 30.0–36.0)
RBC: 4.05 MIL/uL (ref 3.87–5.11)

## 2011-10-18 LAB — URINE MICROSCOPIC-ADD ON

## 2011-10-18 MED ORDER — SODIUM CHLORIDE 0.9 % IV SOLN
INTRAVENOUS | Status: DC
  Administered 2011-10-18: 19:00:00 via INTRAVENOUS

## 2011-10-18 MED ORDER — CEPHALEXIN 500 MG PO CAPS
500.0000 mg | ORAL_CAPSULE | Freq: Once | ORAL | Status: AC
  Administered 2011-10-18: 500 mg via ORAL
  Filled 2011-10-18: qty 1

## 2011-10-18 MED ORDER — CEPHALEXIN 250 MG PO CAPS: 250.0000 mg | ORAL_CAPSULE | Freq: Four times a day (QID) | ORAL | Status: AC

## 2011-10-18 NOTE — ED Notes (Signed)
Pt arrived from home today via ems called d/t hypotension.

## 2011-10-18 NOTE — ED Provider Notes (Signed)
History    Scribed for Flint Melter, MD, the patient was seen in room APA02/APA02. This chart was scribed by Katha Cabal.   CSN: [id number]  Arrival date & time 10/18/11  1647   First MD Initiated Contact with Patient 10/18/11 1749      Chief Complaint  Patient presents with  . Hypotension    (Consider location/radiation/quality/duration/timing/severity/associated sxs/prior treatment) HPI Level 5 caveat applies for Dementia.  Holly Grimes is a 78 y.o. female brought in by ambulance, who presents to the Emergency Department complaining of intermittent moderate hypotension.  Patient accompanied to ED by daughter.  Patient's blood pressure log with  BP of 59/46 at 3:22 PM and at 3:41 PM  BP was 71/54 in the left arm taken after patient was sitting for a shower. Daughter reports patient got lethargic.  There was no fall today.  Patient has had 4 falls since the 10/10/2011.  Patient with left side bruising to left flank and left arm due to fall on the 10/12/11. Family reports patient with detailed complex delusions.  Patient is followed by Dr. Bufford Spikes at [facility].  Patient stopped Enablex on 10/02/11 and stopped linsopril on 09/30/11.  Patient eats after encouragement.  Patient is suspected to  Lewy Body.    Past Medical History  Diagnosis Date  . Hypertension   . High cholesterol   . Mitral valve prolapse   . Dementia   . Dementia   . Parkinson's disease, Lewy body     Past Surgical History  Procedure Date  . Joint replacement   . Tonsillectomy     History reviewed. No pertinent family history.  History  Substance Use Topics  . Smoking status: Never Smoker   . Smokeless tobacco: Not on file  . Alcohol Use: No    OB History    Grav Para Term Preterm Abortions TAB SAB Ect Mult Living                  Review of Systems  Unable to perform ROS: Dementia    Allergies  Sulfa antibiotics  Home Medications   Current Outpatient Rx  Name Route Sig Dispense  Refill  . ACETAMINOPHEN ER 650 MG PO TBCR Oral Take 650 mg by mouth 2 (two) times daily.      Marland Kitchen VITAMIN C-ROSE HIPS 1000 MG PO TABS Oral Take 1,000 mg by mouth daily.      . SUPER B-COMPLEX PO Oral Take 1 tablet by mouth every morning.      Marland Kitchen CITRACAL CALCIUM+D PO Oral Take 1-2 tablets by mouth 2 (two) times daily. Take 1 tab in the morning and 2 tabs in the evening. (Citracal with 500 IU Vitamin D, 400 mg Calcium, and 5 mg Sodium)     . CITALOPRAM HYDROBROMIDE 20 MG PO TABS Oral Take 20 mg by mouth at bedtime.     . CLOPIDOGREL BISULFATE 75 MG PO TABS Oral Take 75 mg by mouth every morning.      Marland Kitchen DOCUSATE SODIUM 100 MG PO CAPS Oral Take 200 mg by mouth daily.    Marland Kitchen FLUTICASONE PROPIONATE 0.05 % EX CREA Topical Apply 1 application topically 2 (two) times daily. To left ear    . MEMANTINE HCL 10 MG PO TABS Oral Take 10 mg by mouth 2 (two) times daily.      . CENTRUM SILVER ULTRA WOMENS PO Oral Take 1 tablet by mouth every morning.      Marland Kitchen OMEGA-3-ACID ETHYL  ESTERS 1 G PO CAPS Oral Take 2 g by mouth 2 (two) times daily.      Marland Kitchen OMEPRAZOLE 20 MG PO CPDR Oral Take 20 mg by mouth daily.      Marland Kitchen POTASSIUM GLUCONATE 595 MG PO TABS Oral Take 595 mg by mouth 2 (two) times daily.      Marland Kitchen RIVASTIGMINE 4.6 MG/24HR TD PT24 Transdermal Place 1 patch onto the skin daily.      Marland Kitchen ROSUVASTATIN CALCIUM 10 MG PO TABS Oral Take 10 mg by mouth at bedtime.      . CEPHALEXIN 250 MG PO CAPS Oral Take 1 capsule (250 mg total) by mouth 4 (four) times daily. 28 capsule 0    BP 172/88  Pulse 69  Temp(Src) 98.5 F (36.9 C) (Oral)  Resp 18  SpO2 99%  LMP 08/22/2011  Physical Exam  Nursing note and vitals reviewed. Constitutional: She appears well-developed and well-nourished.  HENT:  Head: Normocephalic and atraumatic.  Eyes: Conjunctivae and EOM are normal. Pupils are equal, round, and reactive to light.  Neck: Normal range of motion and phonation normal. Neck supple.  Cardiovascular: Normal rate, regular rhythm,  normal heart sounds and intact distal pulses.   Pulmonary/Chest: Effort normal and breath sounds normal. No respiratory distress. She exhibits no tenderness.  Abdominal: Soft. She exhibits no distension and no mass. There is no hepatosplenomegaly. There is no tenderness. There is no guarding.  Musculoskeletal: Normal range of motion.       See skin exam  Neurological: She is alert. She has normal strength and normal reflexes. She exhibits normal muscle tone.       No pronator drift   Skin: Skin is warm and dry.       bruising to left flank and left elbow   Psychiatric: Her behavior is normal.    ED Course  Procedures (including critical care time)   DIAGNOSTIC STUDIES: Oxygen Saturation is 95% on nasal cannula, adequate by my interpretation.     COORDINATION OF CARE: 6:05 PM  Physical exam complete.  Will review patient medications.   8:44 PM  Reviewed vital signs.   8:50 PM  Recheck.   Patient ready to go home.  Plan to discharge patient. Will give one dose of Keflex.  Discussed ED blood pressures with family.  Patient is not hypotensive.  Plan to discharge patient.  Patient and family agree with plan.     LABS / RADIOLOGY:   Labs Reviewed  BASIC METABOLIC PANEL - Abnormal; Notable for the following:    GFR calc non Af Amer 54 (*)    GFR calc Af Amer 63 (*)    All other components within normal limits  URINALYSIS, ROUTINE W REFLEX MICROSCOPIC - Abnormal; Notable for the following:    Specific Gravity, Urine <1.005 (*)    Leukocytes, UA SMALL (*)    All other components within normal limits  URINE MICROSCOPIC-ADD ON - Abnormal; Notable for the following:    Squamous Epithelial / LPF FEW (*)    Bacteria, UA FEW (*)    All other components within normal limits  CBC  DIFFERENTIAL  URINE CULTURE   Results for orders placed during the hospital encounter of 10/18/11  CBC      Component Value Range   WBC 7.8  4.0 - 10.5 (K/uL)   RBC 4.05  3.87 - 5.11 (MIL/uL)   Hemoglobin  13.1  12.0 - 15.0 (g/dL)   HCT 16.1  09.6 - 04.5 (%)  MCV 97.5  78.0 - 100.0 (fL)   MCH 32.3  26.0 - 34.0 (pg)   MCHC 33.2  30.0 - 36.0 (g/dL)   RDW 16.1  09.6 - 04.5 (%)   Platelets 205  150 - 400 (K/uL)  DIFFERENTIAL      Component Value Range   Neutrophils Relative 75  43 - 77 (%)   Neutro Abs 5.9  1.7 - 7.7 (K/uL)   Lymphocytes Relative 15  12 - 46 (%)   Lymphs Abs 1.1  0.7 - 4.0 (K/uL)   Monocytes Relative 9  3 - 12 (%)   Monocytes Absolute 0.7  0.1 - 1.0 (K/uL)   Eosinophils Relative 1  0 - 5 (%)   Eosinophils Absolute 0.1  0.0 - 0.7 (K/uL)   Basophils Relative 0  0 - 1 (%)   Basophils Absolute 0.0  0.0 - 0.1 (K/uL)  BASIC METABOLIC PANEL      Component Value Range   Sodium 135  135 - 145 (mEq/L)   Potassium 3.9  3.5 - 5.1 (mEq/L)   Chloride 100  96 - 112 (mEq/L)   CO2 28  19 - 32 (mEq/L)   Glucose, Bld 97  70 - 99 (mg/dL)   BUN 13  6 - 23 (mg/dL)   Creatinine, Ser 4.09  0.50 - 1.10 (mg/dL)   Calcium 81.1  8.4 - 10.5 (mg/dL)   GFR calc non Af Amer 54 (*) >90 (mL/min)   GFR calc Af Amer 63 (*) >90 (mL/min)  URINALYSIS, ROUTINE W REFLEX MICROSCOPIC      Component Value Range   Color, Urine YELLOW  YELLOW    APPearance CLEAR  CLEAR    Specific Gravity, Urine <1.005 (*) 1.005 - 1.030    pH 6.0  5.0 - 8.0    Glucose, UA NEGATIVE  NEGATIVE (mg/dL)   Hgb urine dipstick NEGATIVE  NEGATIVE    Bilirubin Urine NEGATIVE  NEGATIVE    Ketones, ur NEGATIVE  NEGATIVE (mg/dL)   Protein, ur NEGATIVE  NEGATIVE (mg/dL)   Urobilinogen, UA 0.2  0.0 - 1.0 (mg/dL)   Nitrite NEGATIVE  NEGATIVE    Leukocytes, UA SMALL (*) NEGATIVE   URINE MICROSCOPIC-ADD ON      Component Value Range   Squamous Epithelial / LPF FEW (*) RARE    WBC, UA 7-10  <3 (WBC/hpf)   RBC / HPF 0-2  <3 (RBC/hpf)   Bacteria, UA FEW (*) RARE     Dg Chest 2 View  10/18/2011  *RADIOLOGY REPORT*  Clinical Data: Confusion.  Hypertension.  CHEST - 2 VIEW  Comparison: 09/04/2011  Findings: Midline trachea.  Mild  cardiomegaly. No pleural effusion or pneumothorax.  Small hiatal hernia. No congestive failure.  Clear lungs.  IMPRESSION: Mild cardiomegaly. No acute findings.  Original Report Authenticated By: Consuello Bossier, M.D.   Ct Head Wo Contrast  10/18/2011  *RADIOLOGY REPORT*  Clinical Data: 78 year old female with confusion.  History of TIA.  CT HEAD WITHOUT CONTRAST  Technique:  Contiguous axial images were obtained from the base of the skull through the vertex without contrast.  Comparison: 06/17/2011.  Findings: Visualized paranasal sinuses and mastoids are clear.  No acute scalp or orbit soft tissue findings. No acute osseous abnormality identified.  Calcified atherosclerosis at the skull base.  Stable encephalomalacia adjacent to the right frontal horn and affecting the anterior insula and external capsule.  Patchy confluent cerebral white matter hypodensity elsewhere is not significantly changed.  No ventriculomegaly. No midline  shift, mass effect, or evidence of mass lesion.  Chronic small thalamic and lentiform nuclei infarcts.  Small chronic lacunar infarct in the left cerebellar hemisphere. No acute intracranial hemorrhage identified. No evidence of cortically based acute infarction identified.  No suspicious intracranial vascular hyperdensity.  IMPRESSION: Stable chronic ischemic changes. No acute intracranial abnormality.  Original Report Authenticated By: Harley Hallmark, M.D.         MDM  No recurrence of hypotension emergency department. Incidental urinary tract infection discovered. Doubt systemic infection, metabolic instability or sepsis     MEDICATIONS GIVEN IN THE E.D. Scheduled Meds:    . cephALEXin  500 mg Oral Once   Continuous Infusions:    . sodium chloride 125 mL/hr at 10/18/11 1917       IMPRESSION: 1. UTI (lower urinary tract infection)        I personally performed the services described in this documentation, which was scribed in my presence. The  recorded information has been reviewed and considered.  Scribe           Flint Melter, MD 10/19/11 [phone]

## 2011-10-21 DIAGNOSIS — R197 Diarrhea, unspecified: Secondary | ICD-10-CM | POA: Diagnosis not present

## 2011-10-21 DIAGNOSIS — Z9181 History of falling: Secondary | ICD-10-CM | POA: Diagnosis not present

## 2011-10-21 DIAGNOSIS — G319 Degenerative disease of nervous system, unspecified: Secondary | ICD-10-CM | POA: Diagnosis not present

## 2011-10-23 DIAGNOSIS — Z9181 History of falling: Secondary | ICD-10-CM | POA: Diagnosis not present

## 2011-10-23 DIAGNOSIS — I10 Essential (primary) hypertension: Secondary | ICD-10-CM | POA: Diagnosis not present

## 2011-10-23 DIAGNOSIS — G309 Alzheimer's disease, unspecified: Secondary | ICD-10-CM | POA: Diagnosis not present

## 2011-10-23 DIAGNOSIS — R262 Difficulty in walking, not elsewhere classified: Secondary | ICD-10-CM | POA: Diagnosis not present

## 2011-10-23 DIAGNOSIS — M6281 Muscle weakness (generalized): Secondary | ICD-10-CM | POA: Diagnosis not present

## 2011-10-27 DIAGNOSIS — I10 Essential (primary) hypertension: Secondary | ICD-10-CM | POA: Diagnosis not present

## 2011-10-27 DIAGNOSIS — G309 Alzheimer's disease, unspecified: Secondary | ICD-10-CM | POA: Diagnosis not present

## 2011-10-27 DIAGNOSIS — R262 Difficulty in walking, not elsewhere classified: Secondary | ICD-10-CM | POA: Diagnosis not present

## 2011-10-27 DIAGNOSIS — Z9181 History of falling: Secondary | ICD-10-CM | POA: Diagnosis not present

## 2011-10-27 DIAGNOSIS — M6281 Muscle weakness (generalized): Secondary | ICD-10-CM | POA: Diagnosis not present

## 2011-10-28 DIAGNOSIS — R5381 Other malaise: Secondary | ICD-10-CM | POA: Diagnosis not present

## 2011-10-28 DIAGNOSIS — R262 Difficulty in walking, not elsewhere classified: Secondary | ICD-10-CM | POA: Diagnosis not present

## 2011-10-28 DIAGNOSIS — G319 Degenerative disease of nervous system, unspecified: Secondary | ICD-10-CM | POA: Diagnosis not present

## 2011-10-28 DIAGNOSIS — M6281 Muscle weakness (generalized): Secondary | ICD-10-CM | POA: Diagnosis not present

## 2011-10-28 DIAGNOSIS — R21 Rash and other nonspecific skin eruption: Secondary | ICD-10-CM | POA: Diagnosis not present

## 2011-10-28 DIAGNOSIS — R413 Other amnesia: Secondary | ICD-10-CM | POA: Diagnosis not present

## 2011-10-28 DIAGNOSIS — Z23 Encounter for immunization: Secondary | ICD-10-CM | POA: Diagnosis not present

## 2011-10-28 DIAGNOSIS — R269 Unspecified abnormalities of gait and mobility: Secondary | ICD-10-CM | POA: Diagnosis not present

## 2011-10-28 DIAGNOSIS — I10 Essential (primary) hypertension: Secondary | ICD-10-CM | POA: Diagnosis not present

## 2011-10-28 DIAGNOSIS — Z9181 History of falling: Secondary | ICD-10-CM | POA: Diagnosis not present

## 2011-10-28 DIAGNOSIS — G309 Alzheimer's disease, unspecified: Secondary | ICD-10-CM | POA: Diagnosis not present

## 2011-11-04 DIAGNOSIS — R413 Other amnesia: Secondary | ICD-10-CM | POA: Diagnosis not present

## 2011-11-04 DIAGNOSIS — F0280 Dementia in other diseases classified elsewhere without behavioral disturbance: Secondary | ICD-10-CM | POA: Diagnosis not present

## 2011-11-04 DIAGNOSIS — M6281 Muscle weakness (generalized): Secondary | ICD-10-CM | POA: Diagnosis not present

## 2011-11-04 DIAGNOSIS — R262 Difficulty in walking, not elsewhere classified: Secondary | ICD-10-CM | POA: Diagnosis not present

## 2011-11-04 DIAGNOSIS — I10 Essential (primary) hypertension: Secondary | ICD-10-CM | POA: Diagnosis not present

## 2011-11-04 DIAGNOSIS — Z9181 History of falling: Secondary | ICD-10-CM | POA: Diagnosis not present

## 2011-11-08 DIAGNOSIS — R6889 Other general symptoms and signs: Secondary | ICD-10-CM | POA: Diagnosis not present

## 2011-11-10 DIAGNOSIS — R269 Unspecified abnormalities of gait and mobility: Secondary | ICD-10-CM | POA: Diagnosis not present

## 2011-11-10 DIAGNOSIS — G319 Degenerative disease of nervous system, unspecified: Secondary | ICD-10-CM | POA: Diagnosis not present

## 2011-11-10 DIAGNOSIS — N393 Stress incontinence (female) (male): Secondary | ICD-10-CM | POA: Diagnosis not present

## 2011-11-10 DIAGNOSIS — I951 Orthostatic hypotension: Secondary | ICD-10-CM | POA: Diagnosis not present

## 2011-11-12 DIAGNOSIS — G309 Alzheimer's disease, unspecified: Secondary | ICD-10-CM | POA: Diagnosis not present

## 2011-11-12 DIAGNOSIS — Z9181 History of falling: Secondary | ICD-10-CM | POA: Diagnosis not present

## 2011-11-12 DIAGNOSIS — R262 Difficulty in walking, not elsewhere classified: Secondary | ICD-10-CM | POA: Diagnosis not present

## 2011-11-12 DIAGNOSIS — R413 Other amnesia: Secondary | ICD-10-CM | POA: Diagnosis not present

## 2011-11-12 DIAGNOSIS — I10 Essential (primary) hypertension: Secondary | ICD-10-CM | POA: Diagnosis not present

## 2011-11-12 DIAGNOSIS — M6281 Muscle weakness (generalized): Secondary | ICD-10-CM | POA: Diagnosis not present

## 2011-11-19 DIAGNOSIS — I10 Essential (primary) hypertension: Secondary | ICD-10-CM | POA: Diagnosis not present

## 2011-11-19 DIAGNOSIS — M6281 Muscle weakness (generalized): Secondary | ICD-10-CM | POA: Diagnosis not present

## 2011-11-19 DIAGNOSIS — R262 Difficulty in walking, not elsewhere classified: Secondary | ICD-10-CM | POA: Diagnosis not present

## 2011-11-19 DIAGNOSIS — F0280 Dementia in other diseases classified elsewhere without behavioral disturbance: Secondary | ICD-10-CM | POA: Diagnosis not present

## 2011-11-19 DIAGNOSIS — Z9181 History of falling: Secondary | ICD-10-CM | POA: Diagnosis not present

## 2011-11-19 DIAGNOSIS — R413 Other amnesia: Secondary | ICD-10-CM | POA: Diagnosis not present

## 2011-11-27 DIAGNOSIS — F0280 Dementia in other diseases classified elsewhere without behavioral disturbance: Secondary | ICD-10-CM | POA: Diagnosis not present

## 2011-11-27 DIAGNOSIS — R413 Other amnesia: Secondary | ICD-10-CM | POA: Diagnosis not present

## 2011-11-27 DIAGNOSIS — Z9181 History of falling: Secondary | ICD-10-CM | POA: Diagnosis not present

## 2011-11-27 DIAGNOSIS — M6281 Muscle weakness (generalized): Secondary | ICD-10-CM | POA: Diagnosis not present

## 2011-11-27 DIAGNOSIS — R262 Difficulty in walking, not elsewhere classified: Secondary | ICD-10-CM | POA: Diagnosis not present

## 2011-11-27 DIAGNOSIS — I10 Essential (primary) hypertension: Secondary | ICD-10-CM | POA: Diagnosis not present

## 2011-12-04 DIAGNOSIS — M6281 Muscle weakness (generalized): Secondary | ICD-10-CM | POA: Diagnosis not present

## 2011-12-04 DIAGNOSIS — Z9181 History of falling: Secondary | ICD-10-CM | POA: Diagnosis not present

## 2011-12-04 DIAGNOSIS — F0280 Dementia in other diseases classified elsewhere without behavioral disturbance: Secondary | ICD-10-CM | POA: Diagnosis not present

## 2011-12-04 DIAGNOSIS — I10 Essential (primary) hypertension: Secondary | ICD-10-CM | POA: Diagnosis not present

## 2011-12-04 DIAGNOSIS — R262 Difficulty in walking, not elsewhere classified: Secondary | ICD-10-CM | POA: Diagnosis not present

## 2011-12-04 DIAGNOSIS — R413 Other amnesia: Secondary | ICD-10-CM | POA: Diagnosis not present

## 2011-12-11 DIAGNOSIS — R262 Difficulty in walking, not elsewhere classified: Secondary | ICD-10-CM | POA: Diagnosis not present

## 2011-12-11 DIAGNOSIS — R413 Other amnesia: Secondary | ICD-10-CM | POA: Diagnosis not present

## 2011-12-11 DIAGNOSIS — M6281 Muscle weakness (generalized): Secondary | ICD-10-CM | POA: Diagnosis not present

## 2011-12-11 DIAGNOSIS — I10 Essential (primary) hypertension: Secondary | ICD-10-CM | POA: Diagnosis not present

## 2011-12-11 DIAGNOSIS — G309 Alzheimer's disease, unspecified: Secondary | ICD-10-CM | POA: Diagnosis not present

## 2011-12-11 DIAGNOSIS — Z9181 History of falling: Secondary | ICD-10-CM | POA: Diagnosis not present

## 2011-12-17 DIAGNOSIS — R413 Other amnesia: Secondary | ICD-10-CM | POA: Diagnosis not present

## 2011-12-17 DIAGNOSIS — M6281 Muscle weakness (generalized): Secondary | ICD-10-CM | POA: Diagnosis not present

## 2011-12-17 DIAGNOSIS — I10 Essential (primary) hypertension: Secondary | ICD-10-CM | POA: Diagnosis not present

## 2011-12-17 DIAGNOSIS — Z9181 History of falling: Secondary | ICD-10-CM | POA: Diagnosis not present

## 2011-12-17 DIAGNOSIS — R262 Difficulty in walking, not elsewhere classified: Secondary | ICD-10-CM | POA: Diagnosis not present

## 2011-12-17 DIAGNOSIS — F0280 Dementia in other diseases classified elsewhere without behavioral disturbance: Secondary | ICD-10-CM | POA: Diagnosis not present

## 2011-12-24 DIAGNOSIS — I10 Essential (primary) hypertension: Secondary | ICD-10-CM | POA: Diagnosis not present

## 2011-12-24 DIAGNOSIS — Z9181 History of falling: Secondary | ICD-10-CM | POA: Diagnosis not present

## 2011-12-24 DIAGNOSIS — M6281 Muscle weakness (generalized): Secondary | ICD-10-CM | POA: Diagnosis not present

## 2011-12-24 DIAGNOSIS — R262 Difficulty in walking, not elsewhere classified: Secondary | ICD-10-CM | POA: Diagnosis not present

## 2011-12-24 DIAGNOSIS — F0280 Dementia in other diseases classified elsewhere without behavioral disturbance: Secondary | ICD-10-CM | POA: Diagnosis not present

## 2011-12-24 DIAGNOSIS — R413 Other amnesia: Secondary | ICD-10-CM | POA: Diagnosis not present

## 2011-12-27 DIAGNOSIS — R413 Other amnesia: Secondary | ICD-10-CM | POA: Diagnosis not present

## 2011-12-27 DIAGNOSIS — G309 Alzheimer's disease, unspecified: Secondary | ICD-10-CM | POA: Diagnosis not present

## 2011-12-27 DIAGNOSIS — I10 Essential (primary) hypertension: Secondary | ICD-10-CM | POA: Diagnosis not present

## 2011-12-27 DIAGNOSIS — R262 Difficulty in walking, not elsewhere classified: Secondary | ICD-10-CM | POA: Diagnosis not present

## 2011-12-27 DIAGNOSIS — Z9181 History of falling: Secondary | ICD-10-CM | POA: Diagnosis not present

## 2011-12-27 DIAGNOSIS — M6281 Muscle weakness (generalized): Secondary | ICD-10-CM | POA: Diagnosis not present

## 2011-12-27 DIAGNOSIS — G319 Degenerative disease of nervous system, unspecified: Secondary | ICD-10-CM | POA: Diagnosis not present

## 2012-01-01 DIAGNOSIS — M6281 Muscle weakness (generalized): Secondary | ICD-10-CM | POA: Diagnosis not present

## 2012-01-01 DIAGNOSIS — F0280 Dementia in other diseases classified elsewhere without behavioral disturbance: Secondary | ICD-10-CM | POA: Diagnosis not present

## 2012-01-01 DIAGNOSIS — R413 Other amnesia: Secondary | ICD-10-CM | POA: Diagnosis not present

## 2012-01-01 DIAGNOSIS — R262 Difficulty in walking, not elsewhere classified: Secondary | ICD-10-CM | POA: Diagnosis not present

## 2012-01-01 DIAGNOSIS — Z9181 History of falling: Secondary | ICD-10-CM | POA: Diagnosis not present

## 2012-01-01 DIAGNOSIS — I10 Essential (primary) hypertension: Secondary | ICD-10-CM | POA: Diagnosis not present

## 2012-01-01 DIAGNOSIS — G309 Alzheimer's disease, unspecified: Secondary | ICD-10-CM | POA: Diagnosis not present

## 2012-01-08 DIAGNOSIS — I10 Essential (primary) hypertension: Secondary | ICD-10-CM | POA: Diagnosis not present

## 2012-01-08 DIAGNOSIS — R262 Difficulty in walking, not elsewhere classified: Secondary | ICD-10-CM | POA: Diagnosis not present

## 2012-01-08 DIAGNOSIS — F0280 Dementia in other diseases classified elsewhere without behavioral disturbance: Secondary | ICD-10-CM | POA: Diagnosis not present

## 2012-01-08 DIAGNOSIS — Z9181 History of falling: Secondary | ICD-10-CM | POA: Diagnosis not present

## 2012-01-08 DIAGNOSIS — F068 Other specified mental disorders due to known physiological condition: Secondary | ICD-10-CM | POA: Diagnosis not present

## 2012-01-08 DIAGNOSIS — R413 Other amnesia: Secondary | ICD-10-CM | POA: Diagnosis not present

## 2012-01-08 DIAGNOSIS — M6281 Muscle weakness (generalized): Secondary | ICD-10-CM | POA: Diagnosis not present

## 2012-01-15 DIAGNOSIS — R413 Other amnesia: Secondary | ICD-10-CM | POA: Diagnosis not present

## 2012-01-15 DIAGNOSIS — Z9181 History of falling: Secondary | ICD-10-CM | POA: Diagnosis not present

## 2012-01-15 DIAGNOSIS — G309 Alzheimer's disease, unspecified: Secondary | ICD-10-CM | POA: Diagnosis not present

## 2012-01-15 DIAGNOSIS — I10 Essential (primary) hypertension: Secondary | ICD-10-CM | POA: Diagnosis not present

## 2012-01-15 DIAGNOSIS — R262 Difficulty in walking, not elsewhere classified: Secondary | ICD-10-CM | POA: Diagnosis not present

## 2012-01-15 DIAGNOSIS — M6281 Muscle weakness (generalized): Secondary | ICD-10-CM | POA: Diagnosis not present

## 2012-01-22 DIAGNOSIS — G309 Alzheimer's disease, unspecified: Secondary | ICD-10-CM | POA: Diagnosis not present

## 2012-01-22 DIAGNOSIS — Z9181 History of falling: Secondary | ICD-10-CM | POA: Diagnosis not present

## 2012-01-22 DIAGNOSIS — R262 Difficulty in walking, not elsewhere classified: Secondary | ICD-10-CM | POA: Diagnosis not present

## 2012-01-22 DIAGNOSIS — M6281 Muscle weakness (generalized): Secondary | ICD-10-CM | POA: Diagnosis not present

## 2012-01-22 DIAGNOSIS — R413 Other amnesia: Secondary | ICD-10-CM | POA: Diagnosis not present

## 2012-01-22 DIAGNOSIS — I10 Essential (primary) hypertension: Secondary | ICD-10-CM | POA: Diagnosis not present

## 2012-01-29 DIAGNOSIS — R262 Difficulty in walking, not elsewhere classified: Secondary | ICD-10-CM | POA: Diagnosis not present

## 2012-01-29 DIAGNOSIS — R413 Other amnesia: Secondary | ICD-10-CM | POA: Diagnosis not present

## 2012-01-29 DIAGNOSIS — Z9181 History of falling: Secondary | ICD-10-CM | POA: Diagnosis not present

## 2012-01-29 DIAGNOSIS — I10 Essential (primary) hypertension: Secondary | ICD-10-CM | POA: Diagnosis not present

## 2012-01-29 DIAGNOSIS — F0280 Dementia in other diseases classified elsewhere without behavioral disturbance: Secondary | ICD-10-CM | POA: Diagnosis not present

## 2012-01-29 DIAGNOSIS — M6281 Muscle weakness (generalized): Secondary | ICD-10-CM | POA: Diagnosis not present

## 2012-02-05 DIAGNOSIS — R413 Other amnesia: Secondary | ICD-10-CM | POA: Diagnosis not present

## 2012-02-05 DIAGNOSIS — Z9181 History of falling: Secondary | ICD-10-CM | POA: Diagnosis not present

## 2012-02-05 DIAGNOSIS — I10 Essential (primary) hypertension: Secondary | ICD-10-CM | POA: Diagnosis not present

## 2012-02-05 DIAGNOSIS — M6281 Muscle weakness (generalized): Secondary | ICD-10-CM | POA: Diagnosis not present

## 2012-02-05 DIAGNOSIS — R262 Difficulty in walking, not elsewhere classified: Secondary | ICD-10-CM | POA: Diagnosis not present

## 2012-02-05 DIAGNOSIS — F0280 Dementia in other diseases classified elsewhere without behavioral disturbance: Secondary | ICD-10-CM | POA: Diagnosis not present

## 2012-02-12 DIAGNOSIS — I10 Essential (primary) hypertension: Secondary | ICD-10-CM | POA: Diagnosis not present

## 2012-02-12 DIAGNOSIS — R413 Other amnesia: Secondary | ICD-10-CM | POA: Diagnosis not present

## 2012-02-12 DIAGNOSIS — F0280 Dementia in other diseases classified elsewhere without behavioral disturbance: Secondary | ICD-10-CM | POA: Diagnosis not present

## 2012-02-12 DIAGNOSIS — M6281 Muscle weakness (generalized): Secondary | ICD-10-CM | POA: Diagnosis not present

## 2012-02-12 DIAGNOSIS — R262 Difficulty in walking, not elsewhere classified: Secondary | ICD-10-CM | POA: Diagnosis not present

## 2012-02-12 DIAGNOSIS — Z9181 History of falling: Secondary | ICD-10-CM | POA: Diagnosis not present

## 2012-02-19 DIAGNOSIS — Z9181 History of falling: Secondary | ICD-10-CM | POA: Diagnosis not present

## 2012-02-19 DIAGNOSIS — R262 Difficulty in walking, not elsewhere classified: Secondary | ICD-10-CM | POA: Diagnosis not present

## 2012-02-19 DIAGNOSIS — F0280 Dementia in other diseases classified elsewhere without behavioral disturbance: Secondary | ICD-10-CM | POA: Diagnosis not present

## 2012-02-19 DIAGNOSIS — R413 Other amnesia: Secondary | ICD-10-CM | POA: Diagnosis not present

## 2012-02-19 DIAGNOSIS — M6281 Muscle weakness (generalized): Secondary | ICD-10-CM | POA: Diagnosis not present

## 2012-02-19 DIAGNOSIS — I10 Essential (primary) hypertension: Secondary | ICD-10-CM | POA: Diagnosis not present

## 2012-02-23 DIAGNOSIS — F0280 Dementia in other diseases classified elsewhere without behavioral disturbance: Secondary | ICD-10-CM | POA: Diagnosis not present

## 2012-02-23 DIAGNOSIS — I10 Essential (primary) hypertension: Secondary | ICD-10-CM | POA: Diagnosis not present

## 2012-02-23 DIAGNOSIS — Z9181 History of falling: Secondary | ICD-10-CM | POA: Diagnosis not present

## 2012-02-23 DIAGNOSIS — R413 Other amnesia: Secondary | ICD-10-CM | POA: Diagnosis not present

## 2012-02-23 DIAGNOSIS — M6281 Muscle weakness (generalized): Secondary | ICD-10-CM | POA: Diagnosis not present

## 2012-02-23 DIAGNOSIS — R262 Difficulty in walking, not elsewhere classified: Secondary | ICD-10-CM | POA: Diagnosis not present

## 2012-02-25 DIAGNOSIS — I10 Essential (primary) hypertension: Secondary | ICD-10-CM | POA: Diagnosis not present

## 2012-02-25 DIAGNOSIS — G319 Degenerative disease of nervous system, unspecified: Secondary | ICD-10-CM | POA: Diagnosis not present

## 2012-02-25 DIAGNOSIS — G309 Alzheimer's disease, unspecified: Secondary | ICD-10-CM | POA: Diagnosis not present

## 2012-02-25 DIAGNOSIS — F0280 Dementia in other diseases classified elsewhere without behavioral disturbance: Secondary | ICD-10-CM | POA: Diagnosis not present

## 2012-02-25 DIAGNOSIS — Z9181 History of falling: Secondary | ICD-10-CM | POA: Diagnosis not present

## 2012-02-25 DIAGNOSIS — R262 Difficulty in walking, not elsewhere classified: Secondary | ICD-10-CM | POA: Diagnosis not present

## 2012-02-25 DIAGNOSIS — M6281 Muscle weakness (generalized): Secondary | ICD-10-CM | POA: Diagnosis not present

## 2012-02-26 DIAGNOSIS — F0280 Dementia in other diseases classified elsewhere without behavioral disturbance: Secondary | ICD-10-CM | POA: Diagnosis not present

## 2012-02-26 DIAGNOSIS — M6281 Muscle weakness (generalized): Secondary | ICD-10-CM | POA: Diagnosis not present

## 2012-02-26 DIAGNOSIS — R262 Difficulty in walking, not elsewhere classified: Secondary | ICD-10-CM | POA: Diagnosis not present

## 2012-02-26 DIAGNOSIS — Z9181 History of falling: Secondary | ICD-10-CM | POA: Diagnosis not present

## 2012-02-26 DIAGNOSIS — I10 Essential (primary) hypertension: Secondary | ICD-10-CM | POA: Diagnosis not present

## 2012-03-02 DIAGNOSIS — L608 Other nail disorders: Secondary | ICD-10-CM | POA: Diagnosis not present

## 2012-03-02 DIAGNOSIS — M2040 Other hammer toe(s) (acquired), unspecified foot: Secondary | ICD-10-CM | POA: Diagnosis not present

## 2012-03-02 DIAGNOSIS — L03039 Cellulitis of unspecified toe: Secondary | ICD-10-CM | POA: Diagnosis not present

## 2012-03-11 DIAGNOSIS — I10 Essential (primary) hypertension: Secondary | ICD-10-CM | POA: Diagnosis not present

## 2012-03-11 DIAGNOSIS — R262 Difficulty in walking, not elsewhere classified: Secondary | ICD-10-CM | POA: Diagnosis not present

## 2012-03-11 DIAGNOSIS — F0280 Dementia in other diseases classified elsewhere without behavioral disturbance: Secondary | ICD-10-CM | POA: Diagnosis not present

## 2012-03-11 DIAGNOSIS — Z9181 History of falling: Secondary | ICD-10-CM | POA: Diagnosis not present

## 2012-03-11 DIAGNOSIS — G309 Alzheimer's disease, unspecified: Secondary | ICD-10-CM | POA: Diagnosis not present

## 2012-03-11 DIAGNOSIS — M6281 Muscle weakness (generalized): Secondary | ICD-10-CM | POA: Diagnosis not present

## 2012-03-24 DIAGNOSIS — F0280 Dementia in other diseases classified elsewhere without behavioral disturbance: Secondary | ICD-10-CM | POA: Diagnosis not present

## 2012-03-24 DIAGNOSIS — R262 Difficulty in walking, not elsewhere classified: Secondary | ICD-10-CM | POA: Diagnosis not present

## 2012-03-24 DIAGNOSIS — M6281 Muscle weakness (generalized): Secondary | ICD-10-CM | POA: Diagnosis not present

## 2012-03-24 DIAGNOSIS — Z9181 History of falling: Secondary | ICD-10-CM | POA: Diagnosis not present

## 2012-03-24 DIAGNOSIS — I10 Essential (primary) hypertension: Secondary | ICD-10-CM | POA: Diagnosis not present

## 2012-03-24 DIAGNOSIS — G309 Alzheimer's disease, unspecified: Secondary | ICD-10-CM | POA: Diagnosis not present

## 2012-04-23 ENCOUNTER — Other Ambulatory Visit: Payer: Self-pay | Admitting: Nurse Practitioner

## 2012-04-23 ENCOUNTER — Ambulatory Visit
Admission: RE | Admit: 2012-04-23 | Discharge: 2012-04-23 | Disposition: A | Discharge status: Home / Self Care | Payer: Medicare Other | Source: Ambulatory Visit | Attending: Nurse Practitioner | Admitting: Nurse Practitioner

## 2012-04-23 DIAGNOSIS — F05 Delirium due to known physiological condition: Secondary | ICD-10-CM | POA: Diagnosis not present

## 2012-04-23 DIAGNOSIS — R0602 Shortness of breath: Secondary | ICD-10-CM

## 2012-04-23 DIAGNOSIS — R4182 Altered mental status, unspecified: Secondary | ICD-10-CM | POA: Diagnosis not present

## 2012-04-23 DIAGNOSIS — I10 Essential (primary) hypertension: Secondary | ICD-10-CM | POA: Diagnosis not present

## 2012-04-23 DIAGNOSIS — R05 Cough: Secondary | ICD-10-CM

## 2012-04-23 DIAGNOSIS — E785 Hyperlipidemia, unspecified: Secondary | ICD-10-CM | POA: Diagnosis not present

## 2012-04-23 DIAGNOSIS — J8410 Pulmonary fibrosis, unspecified: Secondary | ICD-10-CM | POA: Diagnosis not present

## 2012-04-23 DIAGNOSIS — R5381 Other malaise: Secondary | ICD-10-CM | POA: Diagnosis not present

## 2012-04-23 DIAGNOSIS — N390 Urinary tract infection, site not specified: Secondary | ICD-10-CM | POA: Diagnosis not present

## 2012-05-28 DIAGNOSIS — L03039 Cellulitis of unspecified toe: Secondary | ICD-10-CM | POA: Diagnosis not present

## 2012-05-28 DIAGNOSIS — R269 Unspecified abnormalities of gait and mobility: Secondary | ICD-10-CM | POA: Diagnosis not present

## 2012-05-28 DIAGNOSIS — S2341XA Sprain of ribs, initial encounter: Secondary | ICD-10-CM | POA: Diagnosis not present

## 2012-05-28 DIAGNOSIS — I951 Orthostatic hypotension: Secondary | ICD-10-CM | POA: Diagnosis not present

## 2012-05-28 DIAGNOSIS — G319 Degenerative disease of nervous system, unspecified: Secondary | ICD-10-CM | POA: Diagnosis not present

## 2012-08-26 DIAGNOSIS — E785 Hyperlipidemia, unspecified: Secondary | ICD-10-CM | POA: Diagnosis not present

## 2012-08-26 DIAGNOSIS — I10 Essential (primary) hypertension: Secondary | ICD-10-CM | POA: Diagnosis not present

## 2012-08-26 DIAGNOSIS — F05 Delirium due to known physiological condition: Secondary | ICD-10-CM | POA: Diagnosis not present

## 2012-08-26 DIAGNOSIS — Z23 Encounter for immunization: Secondary | ICD-10-CM | POA: Diagnosis not present

## 2012-08-26 DIAGNOSIS — G319 Degenerative disease of nervous system, unspecified: Secondary | ICD-10-CM | POA: Diagnosis not present

## 2012-08-26 DIAGNOSIS — K5900 Constipation, unspecified: Secondary | ICD-10-CM | POA: Diagnosis not present

## 2012-11-25 DIAGNOSIS — I10 Essential (primary) hypertension: Secondary | ICD-10-CM | POA: Diagnosis not present

## 2012-11-25 DIAGNOSIS — G319 Degenerative disease of nervous system, unspecified: Secondary | ICD-10-CM | POA: Diagnosis not present

## 2012-11-25 DIAGNOSIS — Z23 Encounter for immunization: Secondary | ICD-10-CM | POA: Diagnosis not present

## 2012-11-25 DIAGNOSIS — R269 Unspecified abnormalities of gait and mobility: Secondary | ICD-10-CM | POA: Diagnosis not present

## 2013-02-22 ENCOUNTER — Encounter: Payer: Self-pay | Admitting: *Deleted

## 2013-02-24 ENCOUNTER — Ambulatory Visit: Payer: Self-pay | Admitting: Internal Medicine

## 2013-03-10 ENCOUNTER — Ambulatory Visit: Payer: Self-pay | Admitting: Internal Medicine

## 2013-03-11 ENCOUNTER — Encounter: Payer: Self-pay | Admitting: *Deleted

## 2013-03-14 ENCOUNTER — Ambulatory Visit (INDEPENDENT_AMBULATORY_CARE_PROVIDER_SITE_OTHER): Payer: Medicare Other | Admitting: Internal Medicine

## 2013-03-14 ENCOUNTER — Encounter: Payer: Self-pay | Admitting: Internal Medicine

## 2013-03-14 VITALS — BP 160/92 | HR 60 | Temp 98.6°F | Resp 18 | Ht 63.0 in | Wt 178.0 lb

## 2013-03-14 DIAGNOSIS — G20A1 Parkinson's disease without dyskinesia, without mention of fluctuations: Secondary | ICD-10-CM

## 2013-03-14 DIAGNOSIS — G20 Parkinson's disease: Secondary | ICD-10-CM | POA: Diagnosis not present

## 2013-03-14 DIAGNOSIS — I951 Orthostatic hypotension: Secondary | ICD-10-CM

## 2013-03-14 DIAGNOSIS — R350 Frequency of micturition: Secondary | ICD-10-CM | POA: Diagnosis not present

## 2013-03-14 DIAGNOSIS — Z9181 History of falling: Secondary | ICD-10-CM | POA: Insufficient documentation

## 2013-03-14 DIAGNOSIS — F0280 Dementia in other diseases classified elsewhere without behavioral disturbance: Secondary | ICD-10-CM

## 2013-03-14 DIAGNOSIS — I10 Essential (primary) hypertension: Secondary | ICD-10-CM

## 2013-03-14 DIAGNOSIS — G3183 Dementia with Lewy bodies: Secondary | ICD-10-CM | POA: Insufficient documentation

## 2013-03-14 DIAGNOSIS — M1990 Unspecified osteoarthritis, unspecified site: Secondary | ICD-10-CM | POA: Insufficient documentation

## 2013-03-14 NOTE — Progress Notes (Signed)
Patient ID: Holly Grimes, female   DOB: 08/20/1927, 79 y.o.   MRN: [id number] Code Status: DNR, living will and hcpoa  Allergies  Allergen Reactions  . Cephalexin   . Sulfa Antibiotics Other (See Comments)    Daughter unsure of reaction, but believes it is severe  . Sulfites     Chief Complaint  Patient presents with  . Medical Managment of Chronic Issues    confusion, urinary problems, and sleeping a lot    HPI: Patient is a 79 y.o. white female seen in the office today for mgt of her chronic conditions including vascular dementia, falls, urinary incontinence and daytime sleepiness.  Her psychiatrist added haldol prn.  Had to be used a few times for psychosis.   Daughter notes gait worse and tremor unchanged.  Is more confused at times.  Pt says she is trying not to worry.  No falls lately.  Sleeps in days and at night, but has occasional trouble initiating sleep.  Thinks it is morning when she gets up to urinate.  Has days and nights reversed.  Trying to get up at 10am daily.  Up to drink 3 glasses of water, then naps.  Also has periods where can stay up for 2 days around the clock.  Try to adjust lighting appropriately.  She says she has no idea all of this is happening.  Denies pain except rarely if in bed too much hip hurts.  Review of Systems:  Review of Systems  Constitutional: Positive for malaise/fatigue.  HENT: Positive for hearing loss. Negative for congestion.   Eyes: Negative for blurred vision.  Respiratory: Negative for shortness of breath.   Cardiovascular: Positive for leg swelling. Negative for chest pain.  Gastrointestinal: Negative for constipation, blood in stool and melena.  Genitourinary: Positive for urgency. Negative for dysuria, frequency and hematuria.  Musculoskeletal: Positive for falls, joint pain and myalgias.  Skin: Negative for rash.  Neurological: Positive for weakness. Negative for loss of consciousness.  Endo/Heme/Allergies: Bruises/bleeds easily.   Psychiatric/Behavioral: Positive for depression and memory loss.     Past Medical History  Diagnosis Date  . Hypertension   . High cholesterol   . Mitral valve prolapse   . Dementia   . Parkinson's disease, Lewy body   . Unspecified constipation   . Onychia and paronychia of toe   . Spasm of muscle   . Abnormality of gait   . Orthostatic hypotension   . Major depressive disorder, single episode, unspecified   . Diaphragmatic hernia without mention of obstruction or gangrene   . Alzheimer's disease   . Osteoarthrosis, unspecified whether generalized or localized, unspecified site   . Osteoporosis, unspecified   . Unspecified constipation   . Sprain of ribs   . Delirium due to conditions classified elsewhere   . Diarrhea   . Edema   . Pain in joint, pelvic region and thigh   . Other malaise and fatigue   . Urinary frequency [id number]  . Personal history of fall   . Female stress incontinence   . Other and unspecified hyperlipidemia   . Obesity, unspecified   . Osteoarthrosis, unspecified whether generalized or localized, unspecified site   . Pain in joint, shoulder region   . Transient ischemic attack (TIA), and cerebral infarction without residual deficits(V12.54)    Past Surgical History  Procedure Laterality Date  . Joint replacement    . Tonsillectomy    . Ankle surgery Left 1998  . Total knee arthroplasty Right 2002  .  Total knee arthroplasty Left 2005   Social History:   reports that she has never smoked. She does not have any smokeless tobacco history on file. She reports that she does not drink alcohol or use illicit drugs.  Family History  Problem Relation Age of Onset  . Adopted: Yes    Medications: Patient's Medications  New Prescriptions   No medications on file  Previous Medications   ACETAMINOPHEN (TYLENOL) 650 MG CR TABLET    Take 650 mg by mouth 2 (two) times daily.     ASCORBIC ACID (VITAMIN C WITH ROSE HIPS) 1000 MG TABLET    Take 1,000 mg  by mouth daily.     CHOLECALCIFEROL (VITAMIN D3) 2000 UNITS TABS    Take by mouth daily.   CITALOPRAM (CELEXA) 20 MG TABLET    Take 20 mg by mouth at bedtime.    CLOPIDOGREL (PLAVIX) 75 MG TABLET    Take 75 mg by mouth every morning.     DOCUSATE SODIUM (COLACE) 100 MG CAPSULE    Take 200 mg by mouth daily.   FLUTICASONE (CUTIVATE) 0.05 % CREAM    Apply 1 application topically 2 (two) times daily. To left ear   HALOPERIDOL (HALDOL) 0.5 MG TABLET       MEMANTINE HCL ER (NAMENDA XR) 28 MG CP24    Take by mouth. Take one tablet once a day to preserve memory   MULTIPLE VITAMINS-MINERALS (CENTRUM SILVER ULTRA WOMENS PO)    Take 1 tablet by mouth every morning.     OMEGA-3 ACID ETHYL ESTERS (LOVAZA) 1 G CAPSULE    Take 2 g by mouth 2 (two) times daily.     OMEPRAZOLE (PRILOSEC) 20 MG CAPSULE    Take 20 mg by mouth daily.     RIVASTIGMINE (EXELON) 4.6 MG/24HR    Place 1 patch onto the skin daily.     ROSUVASTATIN (CRESTOR) 10 MG TABLET    Take 10 mg by mouth at bedtime.    Modified Medications   No medications on file  Discontinued Medications   B COMPLEX-BIOTIN-FA (SUPER B-COMPLEX PO)    Take 1 tablet by mouth every morning.     CALCIUM-MAGNESIUM-VITAMIN D (CITRACAL CALCIUM+D PO)    Take 1-2 tablets by mouth 2 (two) times daily. Take 1 tab in the morning and 2 tabs in the evening. (Citracal with 500 IU Vitamin D, 400 mg Calcium, and 5 mg Sodium)    POTASSIUM GLUCONATE 595 MG TABS    Take 595 mg by mouth 2 (two) times daily.       Physical Exam: Filed Vitals:   03/14/13 1545  BP: 160/92  Pulse: 60  Temp: 98.6 F (37 C)  TempSrc: Oral  Resp: 18  Height: 5\' 3"  (1.6 m)  Weight: 178 lb (80.74 kg)  SpO2: 99%  Physical Exam  Constitutional: She appears well-developed and well-nourished. No distress.  HENT:  Head: Normocephalic and atraumatic.  Cardiovascular: Normal rate, regular rhythm, normal heart sounds and intact distal pulses.   Pulmonary/Chest: Effort normal and breath sounds normal.  No respiratory distress.  Abdominal: Soft. Bowel sounds are normal. She exhibits no distension. There is no tenderness.  Musculoskeletal: Normal range of motion. She exhibits edema. She exhibits no tenderness.  Unchanged, ambulates with rollator walker  Neurological: She is alert. She has normal reflexes.  Oriented to person only  Skin: Skin is warm and dry.   Assessment/Plan 1. Urinary frequency -worsened lately and more sleep cycle difficulties suggesting an overlapping  delirium on dementia - Urine culture - Basic metabolic panel - Urinalysis with Reflex Microscopic  2. Parkinson's disease, Lewy body -unchanged, has periods of psychosis needing haldol  3. Hypertension -at goal with current therapy -has difficulty with bp dropping on standing and difficulty remaining hydrated so bp goal <150/90  4. Orthostatic hypotension -causes falls, has difficulty remembering to get up slowly--fortunately, her falls have primarily been sitting down abruptly at this point, does use rollator, but sometimes forgets how to use it properly due to her dementia  5. Personal history of fall -multifactorial, her daughter works very hard with her to prevent this and they have had a home safety assessment in the past -encouraged proper rollator use, standing slowly, adequate hydration  6. Osteoarthrosis, unspecified whether generalized or localized, unspecified site -worst in her hip if she stays in bed too long--activity encouraged especially more walking with her daughter for exercise  Labs/tests ordered:  Bmp, ua c+s

## 2013-03-15 LAB — URINALYSIS, ROUTINE W REFLEX MICROSCOPIC
Bilirubin, UA: NEGATIVE
Glucose, UA: NEGATIVE
Ketones, UA: NEGATIVE
Nitrite, UA: NEGATIVE
Protein, UA: NEGATIVE
RBC, UA: NEGATIVE
Specific Gravity, UA: 1.016 (ref 1.005–1.030)
Urobilinogen, Ur: 0.2 mg/dL (ref 0.0–1.9)
pH, UA: 7 (ref 5.0–7.5)

## 2013-03-15 LAB — MICROSCOPIC EXAMINATION: Bacteria, UA: NONE SEEN

## 2013-03-15 LAB — BASIC METABOLIC PANEL
BUN/Creatinine Ratio: 17 (ref 11–26)
BUN: 14 mg/dL (ref 8–27)
CO2: 27 mmol/L (ref 19–28)
Calcium: 9.7 mg/dL (ref 8.6–10.2)
Chloride: 100 mmol/L (ref 97–108)
Creatinine, Ser: 0.81 mg/dL (ref 0.57–1.00)
GFR calc Af Amer: 78 mL/min/{1.73_m2} (ref >59)
GFR calc non Af Amer: 67 mL/min/{1.73_m2} (ref >59)
Glucose: 89 mg/dL (ref 65–99)
Potassium: 4.2 mmol/L (ref 3.5–5.2)
Sodium: 139 mmol/L (ref 134–144)

## 2013-03-16 ENCOUNTER — Telehealth: Payer: Self-pay | Admitting: *Deleted

## 2013-03-16 LAB — URINE CULTURE

## 2013-03-16 NOTE — Telephone Encounter (Signed)
 Batten daughter would like to know if her mother should try a probiotic or drink some wine occasionally

## 2013-03-18 NOTE — Telephone Encounter (Signed)
A probiotic is fine.  Would not recommend the wine considering her balance and fall history.

## 2013-03-20 DIAGNOSIS — M25549 Pain in joints of unspecified hand: Secondary | ICD-10-CM | POA: Diagnosis not present

## 2013-03-20 DIAGNOSIS — M79609 Pain in unspecified limb: Secondary | ICD-10-CM | POA: Diagnosis not present

## 2013-03-23 NOTE — Telephone Encounter (Signed)
Correction Per Aryani Daffern Batten she was not asking for wine, she was asking for [employer]. I Phil Dopp) mis understood what she was asking for Align not wine

## 2013-07-21 ENCOUNTER — Ambulatory Visit: Payer: Self-pay | Admitting: Internal Medicine

## 2013-08-01 ENCOUNTER — Ambulatory Visit: Payer: Self-pay | Admitting: Internal Medicine

## 2013-09-15 ENCOUNTER — Encounter: Payer: Self-pay | Admitting: Internal Medicine

## 2013-09-15 ENCOUNTER — Ambulatory Visit (INDEPENDENT_AMBULATORY_CARE_PROVIDER_SITE_OTHER): Payer: Medicare Other | Admitting: Internal Medicine

## 2013-09-15 VITALS — BP 120/82 | HR 72 | Temp 97.0°F | Wt 178.0 lb

## 2013-09-15 DIAGNOSIS — N3941 Urge incontinence: Secondary | ICD-10-CM | POA: Diagnosis not present

## 2013-09-15 DIAGNOSIS — I951 Orthostatic hypotension: Secondary | ICD-10-CM | POA: Diagnosis not present

## 2013-09-15 DIAGNOSIS — G20 Parkinson's disease: Secondary | ICD-10-CM

## 2013-09-15 DIAGNOSIS — G20A1 Parkinson's disease without dyskinesia, without mention of fluctuations: Secondary | ICD-10-CM | POA: Diagnosis not present

## 2013-09-15 DIAGNOSIS — I10 Essential (primary) hypertension: Secondary | ICD-10-CM

## 2013-09-15 DIAGNOSIS — F0280 Dementia in other diseases classified elsewhere without behavioral disturbance: Secondary | ICD-10-CM

## 2013-09-15 DIAGNOSIS — Z23 Encounter for immunization: Secondary | ICD-10-CM | POA: Diagnosis not present

## 2013-09-15 DIAGNOSIS — Z9181 History of falling: Secondary | ICD-10-CM

## 2013-09-15 DIAGNOSIS — M1990 Unspecified osteoarthritis, unspecified site: Secondary | ICD-10-CM

## 2013-09-15 DIAGNOSIS — G3183 Dementia with Lewy bodies: Secondary | ICD-10-CM

## 2013-09-15 MED ORDER — MIRABEGRON ER 50 MG PO TB24: 50.0000 mg | ORAL_TABLET | Freq: Every day | ORAL | Status: DC

## 2013-09-15 NOTE — Progress Notes (Signed)
Patient ID: Holly Grimes, female   DOB: 10/29/1926, 79 y.o.   MRN: [id number]   Location:  [facility]  Code Status: DNR   Allergies  Allergen Reactions  . Cephalexin   . Sulfa Antibiotics Other (See Comments)    Daughter unsure of reaction, but believes it is severe  . Sulfites     Chief Complaint  Patient presents with  . Medical Managment of Chronic Issues    4 month f/u  . other    having some episodes of Phychosis & Mania, Constipation, Incontiinence    HPI: Patient is a 79 y.o. white female seen in the office today for medical management of chronic diseases.   Pt says she is fine if she could use her walker, but she does not use it.    Has some difficulty moving her bowels--hard stools.  After manic episode last time, had huge bm.  Diet hasn't been as good lately so her daughter notes they need some more fiber.  Discussed need to drink enough fluids with the fiber supplement if used.  Does not move enough.  Pt is always tired.  Play balloon volleyball and moving legs.  Pt says she is too lazy.    Dizziness on standing has not been happening much.  Has had 2 falls out of bed.  Had goose egg on head after hit head on nightstand.    Generally not sleeping well.  Try very hard to stick to schedule.  Takes nap in afternoon, but seems tired enough for that.  Still up going to the bathroom at night.  Drink only earlier in the day, but urinating what seems like more than she takes in.  Up q 5 mins to 3-4 per night.    Review of Systems:  Review of Systems  Constitutional: Positive for malaise/fatigue.  HENT: Negative for congestion.   Eyes: Negative for blurred vision.  Respiratory: Negative for cough.   Cardiovascular: Negative for chest pain and leg swelling.  Gastrointestinal: Positive for constipation. Negative for abdominal pain, blood in stool and melena.  Genitourinary: Positive for urgency and frequency. Negative for dysuria,  hematuria and flank pain.       Nocturia  Musculoskeletal: Positive for falls. Negative for myalgias.  Skin: Negative for rash.  Neurological: Positive for weakness. Negative for dizziness and loss of consciousness.  Endo/Heme/Allergies: Bruises/bleeds easily.  Psychiatric/Behavioral: Positive for depression and memory loss.     Past Medical History  Diagnosis Date  . Hypertension   . High cholesterol   . Mitral valve prolapse   . Dementia   . Parkinson's disease, Lewy body   . Unspecified constipation   . Onychia and paronychia of toe   . Spasm of muscle   . Abnormality of gait   . Orthostatic hypotension   . Major depressive disorder, single episode, unspecified   . Diaphragmatic hernia without mention of obstruction or gangrene   . Alzheimer's disease   . Osteoarthrosis, unspecified whether generalized or localized, unspecified site   . Osteoporosis, unspecified   . Unspecified constipation   . Sprain of ribs   . Delirium due to conditions classified elsewhere   . Diarrhea   . Edema   . Pain in joint, pelvic region and thigh   . Other malaise and fatigue   . Urinary frequency [id number]  . Personal history of fall   . Female stress incontinence   . Other and unspecified hyperlipidemia   .  Obesity, unspecified   . Osteoarthrosis, unspecified whether generalized or localized, unspecified site   . Pain in joint, shoulder region   . Transient ischemic attack (TIA), and cerebral infarction without residual deficits(V12.54)     Past Surgical History  Procedure Laterality Date  . Joint replacement    . Tonsillectomy    . Ankle surgery Left 1998  . Total knee arthroplasty Right 2002  . Total knee arthroplasty Left 2005    Social History:   reports that she has never smoked. She does not have any smokeless tobacco history on file. She reports that she does not drink alcohol or use illicit drugs.  Family History  Problem Relation Age of Onset  . Adopted: Yes     Medications: Patient's Medications  New Prescriptions   No medications on file  Previous Medications   ACETAMINOPHEN (TYLENOL) 650 MG CR TABLET    Take 650 mg by mouth 2 (two) times daily.     ASCORBIC ACID (VITAMIN C WITH ROSE HIPS) 1000 MG TABLET    Take 1,000 mg by mouth daily.     CHOLECALCIFEROL (VITAMIN D3) 2000 UNITS TABS    Take by mouth daily.   CITALOPRAM (CELEXA) 20 MG TABLET    Take 20 mg by mouth at bedtime.    CLOPIDOGREL (PLAVIX) 75 MG TABLET    Take 75 mg by mouth every morning.     DOCUSATE SODIUM (COLACE) 100 MG CAPSULE    Take 100 mg by mouth daily.    FLUTICASONE (CUTIVATE) 0.05 % CREAM    Apply 1 application topically 2 (two) times daily. To left ear   HALOPERIDOL (HALDOL) 0.5 MG TABLET       MEMANTINE HCL ER (NAMENDA XR) 28 MG CP24    Take by mouth. Take one tablet once a day to preserve memory   MULTIPLE VITAMINS-MINERALS (CENTRUM SILVER ULTRA WOMENS PO)    Take 1 tablet by mouth every morning.     OMEGA-3 ACID ETHYL ESTERS (LOVAZA) 1 G CAPSULE    Take 2 g by mouth 2 (two) times daily.     OMEPRAZOLE (PRILOSEC) 20 MG CAPSULE    Take 20 mg by mouth daily.     RIVASTIGMINE (EXELON) 4.6 MG/24HR    Place 1 patch onto the skin daily.     ROSUVASTATIN (CRESTOR) 10 MG TABLET    Take 10 mg by mouth at bedtime.    Modified Medications   No medications on file  Discontinued Medications   No medications on file     Physical Exam: Filed Vitals:   09/15/13 1559  BP: 120/82  Pulse: 72  Temp: 97 F (36.1 C)  TempSrc: Oral  Weight: 178 lb (80.74 kg)  SpO2: 97%  Physical Exam  Constitutional: She appears well-developed and well-nourished. No distress.  Cardiovascular: Normal rate, regular rhythm, normal heart sounds and intact distal pulses.   Pulmonary/Chest: Effort normal and breath sounds normal. No respiratory distress.  Abdominal: Soft. Bowel sounds are normal. She exhibits no distension and no mass. There is no tenderness.  Musculoskeletal: Normal range of  motion.  Ambulates with rollator walker when she remembers to use it  Neurological: She is alert. No cranial nerve deficit.  Oriented to person and knows she is at Dr's office, but doesn't remember my name  Skin: Skin is warm and dry.  Psychiatric: She has a normal mood and affect.    Labs reviewed: Basic Metabolic Panel:  Recent Labs  14/78/29 1656  NA 139  K 4.2  CL 100  CO2 27  GLUCOSE 89  BUN 14  CREATININE 0.81  CALCIUM 9.7   Assessment/Plan 1. Urge incontinence -big problem for today--will try adding myrbetriq to see if this will decrease her urinary frequency  2. Parkinson's disease, Lewy body -gradually progressing, cont exelon, namenda  3. Orthostatic hypotension -has had in past, avoid getting up too quickly, use walker  4. Hypertension -well controlled without meds  5. Osteoarthrosis, unspecified whether generalized or localized, unspecified site -of right hip and bl knees--cont tylenol use  6. Personal history of fall -her daughter does well encouraging her to use her walker; remains great fall risk  7. Need for prophylactic vaccination and inoculation against influenza -given flu shot  Next appt:  3 mos

## 2013-09-23 ENCOUNTER — Emergency Department (HOSPITAL_COMMUNITY): Payer: Medicare Other

## 2013-09-23 ENCOUNTER — Encounter (HOSPITAL_COMMUNITY): Payer: Self-pay | Admitting: Emergency Medicine

## 2013-09-23 ENCOUNTER — Inpatient Hospital Stay (HOSPITAL_COMMUNITY)
Admission: EM | Admit: 2013-09-23 | Discharge: 2013-09-27 | DRG: 470 | Disposition: A | Discharge status: Skilled Nursing Facility | Payer: Medicare Other | Attending: Internal Medicine | Admitting: Internal Medicine

## 2013-09-23 DIAGNOSIS — E669 Obesity, unspecified: Secondary | ICD-10-CM | POA: Diagnosis not present

## 2013-09-23 DIAGNOSIS — Z8673 Personal history of transient ischemic attack (TIA), and cerebral infarction without residual deficits: Secondary | ICD-10-CM | POA: Diagnosis not present

## 2013-09-23 DIAGNOSIS — G20A1 Parkinson's disease without dyskinesia, without mention of fluctuations: Secondary | ICD-10-CM | POA: Diagnosis present

## 2013-09-23 DIAGNOSIS — M1990 Unspecified osteoarthritis, unspecified site: Secondary | ICD-10-CM | POA: Diagnosis not present

## 2013-09-23 DIAGNOSIS — I059 Rheumatic mitral valve disease, unspecified: Secondary | ICD-10-CM | POA: Diagnosis present

## 2013-09-23 DIAGNOSIS — Z9181 History of falling: Secondary | ICD-10-CM

## 2013-09-23 DIAGNOSIS — Z4889 Encounter for other specified surgical aftercare: Secondary | ICD-10-CM | POA: Diagnosis not present

## 2013-09-23 DIAGNOSIS — S72001S Fracture of unspecified part of neck of right femur, sequela: Secondary | ICD-10-CM

## 2013-09-23 DIAGNOSIS — M25559 Pain in unspecified hip: Secondary | ICD-10-CM | POA: Diagnosis not present

## 2013-09-23 DIAGNOSIS — R269 Unspecified abnormalities of gait and mobility: Secondary | ICD-10-CM | POA: Diagnosis not present

## 2013-09-23 DIAGNOSIS — E785 Hyperlipidemia, unspecified: Secondary | ICD-10-CM | POA: Diagnosis present

## 2013-09-23 DIAGNOSIS — R6889 Other general symptoms and signs: Secondary | ICD-10-CM | POA: Diagnosis not present

## 2013-09-23 DIAGNOSIS — I10 Essential (primary) hypertension: Secondary | ICD-10-CM | POA: Diagnosis present

## 2013-09-23 DIAGNOSIS — G20 Parkinson's disease: Secondary | ICD-10-CM | POA: Diagnosis not present

## 2013-09-23 DIAGNOSIS — M6281 Muscle weakness (generalized): Secondary | ICD-10-CM | POA: Diagnosis not present

## 2013-09-23 DIAGNOSIS — S72001A Fracture of unspecified part of neck of right femur, initial encounter for closed fracture: Secondary | ICD-10-CM

## 2013-09-23 DIAGNOSIS — G309 Alzheimer's disease, unspecified: Secondary | ICD-10-CM | POA: Diagnosis present

## 2013-09-23 DIAGNOSIS — W19XXXA Unspecified fall, initial encounter: Secondary | ICD-10-CM | POA: Diagnosis not present

## 2013-09-23 DIAGNOSIS — M810 Age-related osteoporosis without current pathological fracture: Secondary | ICD-10-CM | POA: Diagnosis present

## 2013-09-23 DIAGNOSIS — S72033A Displaced midcervical fracture of unspecified femur, initial encounter for closed fracture: Secondary | ICD-10-CM | POA: Diagnosis not present

## 2013-09-23 DIAGNOSIS — F0390 Unspecified dementia without behavioral disturbance: Secondary | ICD-10-CM

## 2013-09-23 DIAGNOSIS — Z471 Aftercare following joint replacement surgery: Secondary | ICD-10-CM | POA: Diagnosis not present

## 2013-09-23 DIAGNOSIS — R32 Unspecified urinary incontinence: Secondary | ICD-10-CM | POA: Diagnosis not present

## 2013-09-23 DIAGNOSIS — R350 Frequency of micturition: Secondary | ICD-10-CM | POA: Diagnosis not present

## 2013-09-23 DIAGNOSIS — Z96659 Presence of unspecified artificial knee joint: Secondary | ICD-10-CM

## 2013-09-23 DIAGNOSIS — I951 Orthostatic hypotension: Secondary | ICD-10-CM | POA: Diagnosis not present

## 2013-09-23 DIAGNOSIS — F329 Major depressive disorder, single episode, unspecified: Secondary | ICD-10-CM | POA: Diagnosis not present

## 2013-09-23 DIAGNOSIS — F0280 Dementia in other diseases classified elsewhere without behavioral disturbance: Secondary | ICD-10-CM | POA: Diagnosis present

## 2013-09-23 DIAGNOSIS — Z01818 Encounter for other preprocedural examination: Secondary | ICD-10-CM | POA: Diagnosis not present

## 2013-09-23 DIAGNOSIS — S72009D Fracture of unspecified part of neck of unspecified femur, subsequent encounter for closed fracture with routine healing: Secondary | ICD-10-CM | POA: Diagnosis not present

## 2013-09-23 DIAGNOSIS — S72023B Displaced fracture of epiphysis (separation) (upper) of unspecified femur, initial encounter for open fracture type I or II: Secondary | ICD-10-CM | POA: Diagnosis not present

## 2013-09-23 DIAGNOSIS — S72009A Fracture of unspecified part of neck of unspecified femur, initial encounter for closed fracture: Secondary | ICD-10-CM | POA: Diagnosis not present

## 2013-09-23 DIAGNOSIS — K219 Gastro-esophageal reflux disease without esophagitis: Secondary | ICD-10-CM | POA: Diagnosis not present

## 2013-09-23 DIAGNOSIS — M259 Joint disorder, unspecified: Secondary | ICD-10-CM | POA: Diagnosis not present

## 2013-09-23 DIAGNOSIS — G3183 Dementia with Lewy bodies: Secondary | ICD-10-CM

## 2013-09-23 LAB — CBC WITH DIFFERENTIAL/PLATELET
Eosinophils Relative: 1 % (ref 0–5)
HCT: 40.3 % (ref 36.0–46.0)
Hemoglobin: 13.8 g/dL (ref 12.0–15.0)
Lymphocytes Relative: 18 % (ref 12–46)
MCV: 97.6 fL (ref 78.0–100.0)
Monocytes Absolute: 0.5 10*3/uL (ref 0.1–1.0)
Monocytes Relative: 7 % (ref 3–12)
Neutro Abs: 5.3 10*3/uL (ref 1.7–7.7)
RDW: 12 % (ref 11.5–15.5)
WBC: 7.1 10*3/uL (ref 4.0–10.5)

## 2013-09-23 LAB — URINALYSIS, ROUTINE W REFLEX MICROSCOPIC
Bilirubin Urine: NEGATIVE
Glucose, UA: NEGATIVE mg/dL
Hgb urine dipstick: NEGATIVE
Ketones, ur: NEGATIVE mg/dL
Leukocytes, UA: NEGATIVE
Protein, ur: NEGATIVE mg/dL
Urobilinogen, UA: 0.2 mg/dL (ref 0.0–1.0)

## 2013-09-23 LAB — CREATININE, SERUM
Creatinine, Ser: 0.77 mg/dL (ref 0.50–1.10)
GFR calc Af Amer: 87 mL/min — ABNORMAL LOW (ref >90)
GFR calc non Af Amer: 75 mL/min — ABNORMAL LOW (ref >90)

## 2013-09-23 LAB — POCT I-STAT, CHEM 8
HCT: 43 % (ref 36.0–46.0)
Hemoglobin: 14.6 g/dL (ref 12.0–15.0)
Potassium: 3.7 mEq/L (ref 3.5–5.1)
Sodium: 144 mEq/L (ref 135–145)
TCO2: 28 mmol/L (ref 0–100)

## 2013-09-23 LAB — PROTIME-INR
INR: 1.01 (ref 0.00–1.49)
Prothrombin Time: 13.1 seconds (ref 11.6–15.2)

## 2013-09-23 LAB — ABO/RH: ABO/RH(D): O POS

## 2013-09-23 LAB — APTT: aPTT: 28 seconds (ref 24–37)

## 2013-09-23 MED ORDER — MORPHINE SULFATE 2 MG/ML IJ SOLN: 1.0000 mg | INTRAMUSCULAR | Status: DC | PRN

## 2013-09-23 MED ORDER — HYDROCODONE-ACETAMINOPHEN 5-325 MG PO TABS: 1.0000 | ORAL_TABLET | ORAL | Status: DC | PRN

## 2013-09-23 MED ORDER — HEPARIN SODIUM (PORCINE) 5000 UNIT/ML IJ SOLN
5000.0000 [IU] | Freq: Three times a day (TID) | INTRAMUSCULAR | Status: DC
  Filled 2013-09-23 (×4): qty 1

## 2013-09-23 MED ORDER — MIRABEGRON ER 25 MG PO TB24
25.0000 mg | ORAL_TABLET | Freq: Every day | ORAL | Status: DC
  Administered 2013-09-23 – 2013-09-27 (×4): 25 mg via ORAL
  Filled 2013-09-23 (×5): qty 1

## 2013-09-23 MED ORDER — MORPHINE SULFATE 2 MG/ML IJ SOLN: 2.0000 mg | INTRAMUSCULAR | Status: DC | PRN

## 2013-09-23 MED ORDER — ONDANSETRON HCL 4 MG/2ML IJ SOLN: 4.0000 mg | Freq: Three times a day (TID) | INTRAMUSCULAR | Status: DC | PRN

## 2013-09-23 MED ORDER — ATORVASTATIN CALCIUM 10 MG PO TABS
10.0000 mg | ORAL_TABLET | Freq: Every day | ORAL | Status: DC
  Administered 2013-09-23 – 2013-09-26 (×4): 10 mg via ORAL
  Filled 2013-09-23 (×5): qty 1

## 2013-09-23 MED ORDER — RIVASTIGMINE 4.6 MG/24HR TD PT24
4.6000 mg | MEDICATED_PATCH | Freq: Every day | TRANSDERMAL | Status: DC
  Administered 2013-09-23 – 2013-09-27 (×3): 4.6 mg via TRANSDERMAL
  Filled 2013-09-23 (×5): qty 1

## 2013-09-23 MED ORDER — ACETAMINOPHEN 325 MG PO TABS
650.0000 mg | ORAL_TABLET | Freq: Four times a day (QID) | ORAL | Status: DC | PRN
  Administered 2013-09-23 (×3): 650 mg via ORAL
  Filled 2013-09-23 (×9): qty 2

## 2013-09-23 MED ORDER — ACETAMINOPHEN 325 MG PO TABS
650.0000 mg | ORAL_TABLET | Freq: Four times a day (QID) | ORAL | Status: DC | PRN
  Filled 2013-09-23: qty 2

## 2013-09-23 MED ORDER — DOCUSATE SODIUM 100 MG PO CAPS: 100.0000 mg | ORAL_CAPSULE | Freq: Every day | ORAL | Status: DC | PRN

## 2013-09-23 MED ORDER — OMEGA-3-ACID ETHYL ESTERS 1 G PO CAPS
2.0000 g | ORAL_CAPSULE | Freq: Two times a day (BID) | ORAL | Status: DC
  Administered 2013-09-23 – 2013-09-27 (×8): 2 g via ORAL
  Filled 2013-09-23 (×10): qty 2

## 2013-09-23 MED ORDER — ENSURE COMPLETE PO LIQD
237.0000 mL | ORAL | Status: DC
  Administered 2013-09-23 – 2013-09-26 (×3): 237 mL via ORAL

## 2013-09-23 MED ORDER — ADULT MULTIVITAMIN W/MINERALS CH
1.0000 | ORAL_TABLET | Freq: Every day | ORAL | Status: DC
  Administered 2013-09-23 – 2013-09-27 (×4): 1 via ORAL
  Filled 2013-09-23 (×5): qty 1

## 2013-09-23 MED ORDER — VANCOMYCIN HCL IN DEXTROSE 1-5 GM/200ML-% IV SOLN: 1000.0000 mg | INTRAVENOUS | Status: DC

## 2013-09-23 MED ORDER — PANTOPRAZOLE SODIUM 40 MG PO TBEC
40.0000 mg | DELAYED_RELEASE_TABLET | Freq: Every day | ORAL | Status: DC
  Administered 2013-09-23 – 2013-09-27 (×4): 40 mg via ORAL
  Filled 2013-09-23 (×5): qty 1

## 2013-09-23 MED ORDER — HEPARIN SODIUM (PORCINE) 5000 UNIT/ML IJ SOLN
5000.0000 [IU] | Freq: Three times a day (TID) | INTRAMUSCULAR | Status: DC
  Administered 2013-09-23: 13:00:00 5000 [IU] via SUBCUTANEOUS
  Filled 2013-09-23 (×2): qty 1

## 2013-09-23 MED ORDER — CENTRUM SILVER ULTRA WOMENS PO TABS: 1.0000 | ORAL_TABLET | Freq: Every day | ORAL | Status: DC

## 2013-09-23 MED ORDER — MEMANTINE HCL ER 28 MG PO CP24
28.0000 mg | ORAL_CAPSULE | Freq: Every day | ORAL | Status: DC
  Administered 2013-09-23 – 2013-09-26 (×4): 28 mg via ORAL
  Filled 2013-09-23 (×5): qty 28

## 2013-09-23 MED ORDER — ENSURE PUDDING PO PUDG
1.0000 | ORAL | Status: DC
  Administered 2013-09-24 – 2013-09-27 (×3): 1 via ORAL
  Filled 2013-09-23 (×4): qty 1

## 2013-09-23 MED ORDER — CITALOPRAM HYDROBROMIDE 20 MG PO TABS
20.0000 mg | ORAL_TABLET | Freq: Every day | ORAL | Status: DC
  Administered 2013-09-23 – 2013-09-26 (×4): 20 mg via ORAL
  Filled 2013-09-23 (×5): qty 1

## 2013-09-23 NOTE — Progress Notes (Signed)
PT Cancellation Note  Patient Details Name: Holly Grimes MRN: [id number] DOB: Jan 04, 1927   Cancelled Treatment:     Pt admitted s/p hip fx.  PT deferred pending surgery by Dr Despina Hick.  Please reorder following surgery as appropriate.     [name] 09/23/2013, 12:13 PM

## 2013-09-23 NOTE — ED Provider Notes (Signed)
Medical screening examination/treatment/procedure(s) were conducted as a shared visit with non-physician practitioner(s) and myself.  I personally evaluated the patient during the encounter  Please see my separate respective documentation pertaining to this patient encounter   Vida Roller, MD 09/23/13 [phone]

## 2013-09-23 NOTE — Consult Note (Signed)
Reason for Consult: Right femoral neck fracture Referring Physician: [facility]  Holly Grimes is an 79 y.o. female.  HPI: Holly Grimes is an 79 yo female with Alzheimers dementia who had a fall at home at approximately 330 AM today. She lives with her daughter and son-in -law and got out of bed and was wandering the house when she fell and landed on her right side. Taken to ED with complaint of right hip pain. No other pain complaints. Unable to ambulate after the fall. Was ambulatory with walker prior to the fall. Does not complain of any other systemic symptoms which may have led to the fall. Evaluation in ED shows femoral neck fracture.  Past Medical History  Diagnosis Date  . Hypertension   . High cholesterol   . Mitral valve prolapse   . Dementia   . Parkinson's disease, Lewy body   . Unspecified constipation   . Onychia and paronychia of toe   . Spasm of muscle   . Abnormality of gait   . Orthostatic hypotension   . Major depressive disorder, single episode, unspecified   . Diaphragmatic hernia without mention of obstruction or gangrene   . Alzheimer's disease   . Osteoarthrosis, unspecified whether generalized or localized, unspecified site   . Osteoporosis, unspecified   . Unspecified constipation   . Sprain of ribs   . Delirium due to conditions classified elsewhere   . Diarrhea   . Edema   . Pain in joint, pelvic region and thigh   . Other malaise and fatigue   . Urinary frequency [id number]  . Personal history of fall   . Female stress incontinence   . Other and unspecified hyperlipidemia   . Obesity, unspecified   . Osteoarthrosis, unspecified whether generalized or localized, unspecified site   . Pain in joint, shoulder region   . Transient ischemic attack (TIA), and cerebral infarction without residual deficits(V12.54)     Past Surgical History  Procedure Laterality Date  . Joint replacement    . Tonsillectomy    . Ankle surgery Left 1998  . Total knee  arthroplasty Right 2002  . Total knee arthroplasty Left 2005    Family History  Problem Relation Age of Onset  . Adopted: Yes    Social History:  reports that she has never smoked. She does not have any smokeless tobacco history on file. She reports that she does not drink alcohol or use illicit drugs.  Allergies:  Allergies  Allergen Reactions  . Cephalexin Nausea And Vomiting  . Sulfa Antibiotics Other (See Comments)    Daughter unsure of reaction, but believes it is severe  . Sulfites     Medications: I have reviewed the patient's current medications.  Results for orders placed during the hospital encounter of 09/23/13 (from the past 48 hour(s))  CBC WITH DIFFERENTIAL     Status: None   Collection Time    09/23/13  6:05 AM      Result Value Range   WBC 7.1  4.0 - 10.5 K/uL   RBC 4.13  3.87 - 5.11 MIL/uL   Hemoglobin 13.8  12.0 - 15.0 g/dL   HCT 54.0  98.1 - 19.1 %   MCV 97.6  78.0 - 100.0 fL   MCH 33.4  26.0 - 34.0 pg   MCHC 34.2  30.0 - 36.0 g/dL   RDW 47.8  29.5 - 62.1 %   Platelets 179  150 - 400 K/uL   Neutrophils Relative % 74  43 - 77 %   Neutro Abs 5.3  1.7 - 7.7 K/uL   Lymphocytes Relative 18  12 - 46 %   Lymphs Abs 1.3  0.7 - 4.0 K/uL   Monocytes Relative 7  3 - 12 %   Monocytes Absolute 0.5  0.1 - 1.0 K/uL   Eosinophils Relative 1  0 - 5 %   Eosinophils Absolute 0.1  0.0 - 0.7 K/uL   Basophils Relative 0  0 - 1 %   Basophils Absolute 0.0  0.0 - 0.1 K/uL  URINALYSIS, ROUTINE W REFLEX MICROSCOPIC     Status: None   Collection Time    09/23/13  6:11 AM      Result Value Range   Color, Urine YELLOW  YELLOW   APPearance CLEAR  CLEAR   Specific Gravity, Urine 1.012  1.005 - 1.030   pH 7.0  5.0 - 8.0   Glucose, UA NEGATIVE  NEGATIVE mg/dL   Hgb urine dipstick NEGATIVE  NEGATIVE   Bilirubin Urine NEGATIVE  NEGATIVE   Ketones, ur NEGATIVE  NEGATIVE mg/dL   Protein, ur NEGATIVE  NEGATIVE mg/dL   Urobilinogen, UA 0.2  0.0 - 1.0 mg/dL   Nitrite NEGATIVE   NEGATIVE   Leukocytes, UA NEGATIVE  NEGATIVE   Comment: MICROSCOPIC NOT DONE ON URINES WITH NEGATIVE PROTEIN, BLOOD, LEUKOCYTES, NITRITE, OR GLUCOSE <1000 mg/dL.  POCT I-STAT, CHEM 8     Status: None   Collection Time    09/23/13  6:19 AM      Result Value Range   Sodium 144  135 - 145 mEq/L   Potassium 3.7  3.5 - 5.1 mEq/L   Chloride 104  96 - 112 mEq/L   BUN 16  6 - 23 mg/dL   Creatinine, Ser 1.61  0.50 - 1.10 mg/dL   Glucose, Bld 97  70 - 99 mg/dL   Calcium, Ion 0.96  0.45 - 1.30 mmol/L   TCO2 28  0 - 100 mmol/L   Hemoglobin 14.6  12.0 - 15.0 g/dL   HCT 40.9  81.1 - 91.4 %  TYPE AND SCREEN     Status: None   Collection Time    09/23/13  6:30 AM      Result Value Range   ABO/RH(D) O POS     Antibody Screen NEG     Sample Expiration 09/26/2013    APTT     Status: None   Collection Time    09/23/13  6:30 AM      Result Value Range   aPTT 28  24 - 37 seconds  PROTIME-INR     Status: None   Collection Time    09/23/13  6:30 AM      Result Value Range   Prothrombin Time 13.1  11.6 - 15.2 seconds   INR 1.01  0.00 - 1.49    Dg Chest 1 View  09/23/2013   CLINICAL DATA:  Preop for hip fracture  EXAM: CHEST - 1 VIEW  COMPARISON:  04/23/2012  FINDINGS: Mild cardiomegaly. Previously seen hiatal hernia not seen currently. No acute infiltrate or edema. No effusion or pneumothorax. There are granulomatous changes to the lower lungs. Glenohumeral osteoarthritis.  IMPRESSION: No evidence of acute cardiopulmonary disease.   Electronically Signed   By: Tiburcio Pea M.D.   On: 09/23/2013 06:03   Dg Hip Complete Right  09/23/2013   CLINICAL DATA:  Fall with right hip pain  EXAM: RIGHT HIP - COMPLETE 2+ VIEW  COMPARISON:  08/22/2011  FINDINGS: Subcapital right femoral neck fracture with impaction of the upper margin. The femoral head remains located. No evidence of pelvic ring fracture or diastasis. No significant hip joint narrowing. Lower lumbar degenerative disc and facet disease.   IMPRESSION: Acute, subcapital right femoral neck fracture.   Electronically Signed   By: Tiburcio Pea M.D.   On: 09/23/2013 06:01    ROS Blood pressure 153/80, pulse 79, temperature 98.6 F (37 C), temperature source Oral, resp. rate 16, last menstrual period 08/22/2011, SpO2 96.00%. Physical Exam Physical Examination: General appearance - alert, well appearing, and in no distress Mental status - alert, cooperative, oriented to person Chest - clear to auscultation, no wheezes, rales or rhonchi, symmetric air entry Heart - normal rate, regular rhythm, normal S1, S2, no murmurs, rubs, clicks or gallops Abdomen - soft, nontender, nondistended, no masses or organomegaly Right hip tender to palpation; pain on any attempted range of motion to hip; non-tender at knee; motor intact distally, toes warm and pink with trace palpable pulses  Radiograph- angulated impacted femoral neck fracture with shortening   Assessment/Plan: Right femoral neck fracture- Patient with significant osteopenia and dementia. Discussed treatment options with family and we discussed pinning vs. Hemiarthroplasty. I am concerned that pinning will fail due to angulation of fracture and poor bone quality. Discussed procedure, risks, potential complications and rehab course associated with hemiarthroplasty and family elects for her to proceed. Plan on surgery tomorrow morning.   Loanne Drilling 09/23/2013, 7:48 AM

## 2013-09-23 NOTE — ED Notes (Signed)
Bed: WA23 Expected date:  Expected time:  Means of arrival:  Comments: EMS-fall 

## 2013-09-23 NOTE — ED Provider Notes (Signed)
Pt with fall and R hip pain - on exam has pain with rotation of the R hip - normal pulses and normal sensation and motor to the RLE.  No other injuries.  VS WNL at this time.  Imaging shows sub capital hip fracxture - d/w Dr. Rennis Chris of Orthopedics as well as Dr. Toniann Fail who will admit - temp orders written.  Medical screening examination/treatment/procedure(s) were conducted as a shared visit with non-physician practitioner(s) and myself.  I personally evaluated the patient during the encounter.   Vida Roller, MD 09/23/13 [phone]

## 2013-09-23 NOTE — ED Notes (Signed)
Pt arrived with a complaint of a fall with right hip pain.  Pt has a hx of dementia and history was given by EMS.  EMS states family is in route.  Per EMS Pt had a witnessed fall from a standing position.  Pt is is said to have stated that her right hip hurt.  Pt has a hx of bilateral knee replacements.  Pt is unable to give history or verbalize pain.

## 2013-09-23 NOTE — Progress Notes (Signed)
Clinical Social Work Department CLINICAL SOCIAL WORK PLACEMENT NOTE 09/23/2013  Patient:  ANALISSA, BAYLESS  Account Number:  [id number] Admit date:  09/23/2013  Clinical Social Worker:  Cori Razor, LCSW  Date/time:  09/23/2013 12:47 PM  Clinical Social Work is seeking post-discharge placement for this patient at the following level of care:   SKILLED NURSING   (*CSW will update this form in Epic as items are completed)     Patient/family provided with [facility] Department of Clinical Social Work's list of facilities offering this level of care within the geographic area requested by the patient (or if unable, by the patient's family).  09/23/2013  Patient/family informed of their freedom to choose among providers that offer the needed level of care, that participate in Medicare, Medicaid or managed care program needed by the patient, have an available bed and are willing to accept the patient.    Patient/family informed of [facility]' ownership interest in [facility], as well as of the fact that they are under no obligation to receive care at this facility.  PASARR submitted to EDS on 09/23/2013 PASARR number received from EDS on 09/23/2013  FL2 transmitted to all facilities in geographic area requested by pt/family on  09/23/2013 FL2 transmitted to all facilities within larger geographic area on   Patient informed that his/her managed care company has contracts with or will negotiate with  certain facilities, including the following:     Patient/family informed of bed offers received:   Patient chooses bed at  Physician recommends and patient chooses bed at    Patient to be transferred to  on   Patient to be transferred to facility by   The following physician request were entered in Epic:   Additional Comments:  Cori Razor LCSW [phone]

## 2013-09-23 NOTE — H&P (Signed)
[facility]          History and Physical    PCP:   Bufford Spikes, DO   Chief Complaint:  Fall with right hip pain  HPI: 79 y/o woman with h/o dementia. Lives at home with daughter [name]. Daughter states she woke up at 3:30 am to her mother asking for help. She had gotten out of bed and managed to get to the front door where she fell. No LOC reported. She was unable to stand and daughter called EMS. In the ED, Xrays confirm a right femoral neck fracture. We have been asked to admit her for further evaluation and management. Has already been seen by ortho, with plans for surgery in the am.  Allergies:   Allergies  Allergen Reactions  . Cephalexin Nausea And Vomiting  . Sulfa Antibiotics Other (See Comments)    Daughter unsure of reaction, but believes it is severe  . Sulfites       Past Medical History  Diagnosis Date  . Hypertension   . High cholesterol   . Mitral valve prolapse   . Dementia   . Parkinson's disease, Lewy body   . Unspecified constipation   . Onychia and paronychia of toe   . Spasm of muscle   . Abnormality of gait   . Orthostatic hypotension   . Major depressive disorder, single episode, unspecified   . Diaphragmatic hernia without mention of obstruction or gangrene   . Alzheimer's disease   . Osteoarthrosis, unspecified whether generalized or localized, unspecified site   . Osteoporosis, unspecified   . Unspecified constipation   . Sprain of ribs   . Delirium due to conditions classified elsewhere   . Diarrhea   . Edema   . Pain in joint, pelvic region and thigh   . Other malaise and fatigue   . Urinary frequency [id number]  . Personal history of fall   . Female stress incontinence   . Other and unspecified hyperlipidemia   . Obesity, unspecified   . Osteoarthrosis, unspecified whether generalized or localized, unspecified site   . Pain in joint, shoulder region   . Transient ischemic attack (TIA), and cerebral infarction without  residual deficits(V12.54)     Past Surgical History  Procedure Laterality Date  . Joint replacement    . Tonsillectomy    . Ankle surgery Left 1998  . Total knee arthroplasty Right 2002  . Total knee arthroplasty Left 2005    Prior to Admission medications   Medication Sig Start Date End Date Taking? Authorizing Provider  acetaminophen (TYLENOL) 650 MG CR tablet Take 650 mg by mouth 2 (two) times daily.     Yes Historical Provider, MD  Ascorbic Acid (VITAMIN C WITH ROSE HIPS) 1000 MG tablet Take 1,000 mg by mouth daily.     Yes Historical Provider, MD  Cholecalciferol (VITAMIN D3) 2000 UNITS TABS Take 2,000 Units by mouth daily.    Yes Historical Provider, MD  citalopram (CELEXA) 20 MG tablet Take 20 mg by mouth at bedtime.    Yes Historical Provider, MD  clopidogrel (PLAVIX) 75 MG tablet Take 75 mg by mouth every morning.     Yes Historical Provider, MD  docusate sodium (COLACE) 100 MG capsule Take 100 mg by mouth daily as needed.    Yes Historical Provider, MD  fluticasone (CUTIVATE) 0.05 % cream Apply 1 application topically 2 (two) times daily. To affected areas   Yes Historical Provider, MD  haloperidol (HALDOL) 0.5 MG tablet Take 0.5  mg by mouth every 6 (six) hours as needed for agitation.    Yes Historical Provider, MD  Memantine HCl ER (NAMENDA XR) 28 MG CP24 Take 28 mg by mouth at bedtime. Take one tablet once a day to preserve memory   Yes Historical Provider, MD  mirabegron ER (MYRBETRIQ) 25 MG TB24 tablet Take 25 mg by mouth daily. 09/16/13  Yes Historical Provider, MD  Multiple Vitamins-Minerals (CENTRUM SILVER ULTRA WOMENS PO) Take 1 tablet by mouth every morning.     Yes Historical Provider, MD  omega-3 acid ethyl esters (LOVAZA) 1 G capsule Take 2 g by mouth 2 (two) times daily.     Yes Historical Provider, MD  omeprazole (PRILOSEC) 20 MG capsule Take 20 mg by mouth daily.     Yes Historical Provider, MD  rivastigmine (EXELON) 4.6 mg/24hr Place 1 patch onto the skin daily.     Yes Historical Provider, MD  rosuvastatin (CRESTOR) 10 MG tablet Take 10 mg by mouth at bedtime.     Yes Historical Provider, MD    Social History:  reports that she has never smoked. She does not have any smokeless tobacco history on file. She reports that she does not drink alcohol or use illicit drugs.  Family History  Problem Relation Age of Onset  . Adopted: Yes    Review of Systems:  Unable to obtain given dementia.  Physical Exam: Blood pressure 153/80, pulse 79, temperature 98.6 F (37 C), temperature source Oral, resp. rate 16, last menstrual period 08/22/2011, SpO2 96.00%. Gen: awake, alert, oriented to person only. HEENT: /AT/PERRL/EOMI Neck: supple, no JVD, no LAD, no bruits, no goiter. CV: RRR, +SEM Lungs: CTA B Abd: S/NT/ND/+BS Ext: no C/C/E, pain to palpation of right hip.  Labs on Admission:  Results for orders placed during the hospital encounter of 09/23/13 (from the past 48 hour(s))  CBC WITH DIFFERENTIAL     Status: None   Collection Time    09/23/13  6:05 AM      Result Value Range   WBC 7.1  4.0 - 10.5 K/uL   RBC 4.13  3.87 - 5.11 MIL/uL   Hemoglobin 13.8  12.0 - 15.0 g/dL   HCT 16.1  09.6 - 04.5 %   MCV 97.6  78.0 - 100.0 fL   MCH 33.4  26.0 - 34.0 pg   MCHC 34.2  30.0 - 36.0 g/dL   RDW 40.9  81.1 - 91.4 %   Platelets 179  150 - 400 K/uL   Neutrophils Relative % 74  43 - 77 %   Neutro Abs 5.3  1.7 - 7.7 K/uL   Lymphocytes Relative 18  12 - 46 %   Lymphs Abs 1.3  0.7 - 4.0 K/uL   Monocytes Relative 7  3 - 12 %   Monocytes Absolute 0.5  0.1 - 1.0 K/uL   Eosinophils Relative 1  0 - 5 %   Eosinophils Absolute 0.1  0.0 - 0.7 K/uL   Basophils Relative 0  0 - 1 %   Basophils Absolute 0.0  0.0 - 0.1 K/uL  ABO/RH     Status: None   Collection Time    09/23/13  6:07 AM      Result Value Range   ABO/RH(D) O POS    URINALYSIS, ROUTINE W REFLEX MICROSCOPIC     Status: None   Collection Time    09/23/13  6:11 AM      Result Value Range   Color,  Urine  YELLOW  YELLOW   APPearance CLEAR  CLEAR   Specific Gravity, Urine 1.012  1.005 - 1.030   pH 7.0  5.0 - 8.0   Glucose, UA NEGATIVE  NEGATIVE mg/dL   Hgb urine dipstick NEGATIVE  NEGATIVE   Bilirubin Urine NEGATIVE  NEGATIVE   Ketones, ur NEGATIVE  NEGATIVE mg/dL   Protein, ur NEGATIVE  NEGATIVE mg/dL   Urobilinogen, UA 0.2  0.0 - 1.0 mg/dL   Nitrite NEGATIVE  NEGATIVE   Leukocytes, UA NEGATIVE  NEGATIVE   Comment: MICROSCOPIC NOT DONE ON URINES WITH NEGATIVE PROTEIN, BLOOD, LEUKOCYTES, NITRITE, OR GLUCOSE <1000 mg/dL.  POCT I-STAT, CHEM 8     Status: None   Collection Time    09/23/13  6:19 AM      Result Value Range   Sodium 144  135 - 145 mEq/L   Potassium 3.7  3.5 - 5.1 mEq/L   Chloride 104  96 - 112 mEq/L   BUN 16  6 - 23 mg/dL   Creatinine, Ser 4.09  0.50 - 1.10 mg/dL   Glucose, Bld 97  70 - 99 mg/dL   Calcium, Ion 8.11  9.14 - 1.30 mmol/L   TCO2 28  0 - 100 mmol/L   Hemoglobin 14.6  12.0 - 15.0 g/dL   HCT 78.2  95.6 - 21.3 %  TYPE AND SCREEN     Status: None   Collection Time    09/23/13  6:30 AM      Result Value Range   ABO/RH(D) O POS     Antibody Screen NEG     Sample Expiration 09/26/2013    APTT     Status: None   Collection Time    09/23/13  6:30 AM      Result Value Range   aPTT 28  24 - 37 seconds  PROTIME-INR     Status: None   Collection Time    09/23/13  6:30 AM      Result Value Range   Prothrombin Time 13.1  11.6 - 15.2 seconds   INR 1.01  0.00 - 1.49    Radiological Exams on Admission: Dg Chest 1 View  09/23/2013   CLINICAL DATA:  Preop for hip fracture  EXAM: CHEST - 1 VIEW  COMPARISON:  04/23/2012  FINDINGS: Mild cardiomegaly. Previously seen hiatal hernia not seen currently. No acute infiltrate or edema. No effusion or pneumothorax. There are granulomatous changes to the lower lungs. Glenohumeral osteoarthritis.  IMPRESSION: No evidence of acute cardiopulmonary disease.   Electronically Signed   By: Tiburcio Pea M.D.   On: 09/23/2013  06:03   Dg Hip Complete Right  09/23/2013   CLINICAL DATA:  Fall with right hip pain  EXAM: RIGHT HIP - COMPLETE 2+ VIEW  COMPARISON:  08/22/2011  FINDINGS: Subcapital right femoral neck fracture with impaction of the upper margin. The femoral head remains located. No evidence of pelvic ring fracture or diastasis. No significant hip joint narrowing. Lower lumbar degenerative disc and facet disease.  IMPRESSION: Acute, subcapital right femoral neck fracture.   Electronically Signed   By: Tiburcio Pea M.D.   On: 09/23/2013 06:01    Assessment/Plan Principal Problem:   Hip fracture Active Problems:   Orthostatic hypotension   Hip fracture requiring operative repair   Right Femoral Neck Hip Fracture -Per ortho. -Plan for OR in am. -For now on heparin SQ. -Will hold plavix in anticipation of surgery in am. -Ortho to determine adequate post-hip DVT prophylaxis. -PT/OT after  surgery; will likely need SNF and as such will consult SW.  H/o HTN with orthostatic hypotension -Not on any BP meds. -Follow.  Dementia -Continue namenda and exelon patch.  DVT Prophylaxis -SQ heparin for now.  Time Spent on Admission: 70 minutes.  Chaya Jan [facility] Pager: [phone] 09/23/2013, 9:17 AM

## 2013-09-23 NOTE — ED Notes (Signed)
Family at bedside. 

## 2013-09-23 NOTE — ED Provider Notes (Signed)
CSN: [id number]     Arrival date & time 09/23/13  0440 History   First MD Initiated Contact with Patient 09/23/13 [phone]     Chief Complaint  Patient presents with  . Fall   (Consider location/radiation/quality/duration/timing/severity/associated sxs/prior Treatment) HPI Comments: She lives in a nursing care facility.  She was seen to fall from a standing position to the floor.  Per report.  She is complaining of right hip pain.  On examination, patient is unsure why.  She is in the emergency department.  Patient is a 79 y.o. female presenting with fall.  Fall This is a new problem. The current episode started today. Pertinent negatives include no fever or weakness. The symptoms are aggravated by twisting. She has tried nothing for the symptoms. The treatment provided no relief.    Past Medical History  Diagnosis Date  . Hypertension   . High cholesterol   . Mitral valve prolapse   . Dementia   . Parkinson's disease, Lewy body   . Unspecified constipation   . Onychia and paronychia of toe   . Spasm of muscle   . Abnormality of gait   . Orthostatic hypotension   . Major depressive disorder, single episode, unspecified   . Diaphragmatic hernia without mention of obstruction or gangrene   . Alzheimer's disease   . Osteoarthrosis, unspecified whether generalized or localized, unspecified site   . Osteoporosis, unspecified   . Unspecified constipation   . Sprain of ribs   . Delirium due to conditions classified elsewhere   . Diarrhea   . Edema   . Pain in joint, pelvic region and thigh   . Other malaise and fatigue   . Urinary frequency [id number]  . Personal history of fall   . Female stress incontinence   . Other and unspecified hyperlipidemia   . Obesity, unspecified   . Osteoarthrosis, unspecified whether generalized or localized, unspecified site   . Pain in joint, shoulder region   . Transient ischemic attack (TIA), and cerebral infarction without residual deficits(V12.54)     Past Surgical History  Procedure Laterality Date  . Joint replacement    . Tonsillectomy    . Ankle surgery Left 1998  . Total knee arthroplasty Right 2002  . Total knee arthroplasty Left 2005   Family History  Problem Relation Age of Onset  . Adopted: Yes   History  Substance Use Topics  . Smoking status: Never Smoker   . Smokeless tobacco: Not on file  . Alcohol Use: No   OB History   Grav Para Term Preterm Abortions TAB SAB Ect Mult Living                 Review of Systems  Constitutional: Negative for fever.  Musculoskeletal: Positive for gait problem.  Neurological: Negative for weakness.  All other systems reviewed and are negative.    Allergies  Cephalexin; Sulfa antibiotics; and Sulfites  Home Medications   Current Outpatient Rx  Name  Route  Sig  Dispense  Refill  . acetaminophen (TYLENOL) 650 MG CR tablet   Oral   Take 650 mg by mouth 2 (two) times daily.           . Ascorbic Acid (VITAMIN C WITH ROSE HIPS) 1000 MG tablet   Oral   Take 1,000 mg by mouth daily.           . Cholecalciferol (VITAMIN D3) 2000 UNITS TABS   Oral   Take 2,000 Units by mouth daily.          Marland Kitchen  citalopram (CELEXA) 20 MG tablet   Oral   Take 20 mg by mouth at bedtime.          . clopidogrel (PLAVIX) 75 MG tablet   Oral   Take 75 mg by mouth every morning.           . docusate sodium (COLACE) 100 MG capsule   Oral   Take 100 mg by mouth daily as needed.          . fluticasone (CUTIVATE) 0.05 % cream   Topical   Apply 1 application topically 2 (two) times daily. To affected areas         . haloperidol (HALDOL) 0.5 MG tablet   Oral   Take 0.5 mg by mouth every 6 (six) hours as needed for agitation.          . Memantine HCl ER (NAMENDA XR) 28 MG CP24   Oral   Take 28 mg by mouth at bedtime. Take one tablet once a day to preserve memory         . mirabegron ER (MYRBETRIQ) 25 MG TB24 tablet   Oral   Take 25 mg by mouth daily.         .  Multiple Vitamins-Minerals (CENTRUM SILVER ULTRA WOMENS PO)   Oral   Take 1 tablet by mouth every morning.           Marland Kitchen omega-3 acid ethyl esters (LOVAZA) 1 G capsule   Oral   Take 2 g by mouth 2 (two) times daily.           Marland Kitchen omeprazole (PRILOSEC) 20 MG capsule   Oral   Take 20 mg by mouth daily.           . rivastigmine (EXELON) 4.6 mg/24hr   Transdermal   Place 1 patch onto the skin daily.          . rosuvastatin (CRESTOR) 10 MG tablet   Oral   Take 10 mg by mouth at bedtime.            BP 153/80  Pulse 79  Temp(Src) 98.6 F (37 C) (Oral)  Resp 16  SpO2 96%  LMP 08/22/2011 Physical Exam  Nursing note and vitals reviewed. Constitutional: She appears well-developed and well-nourished. No distress.  HENT:  Head: Normocephalic and atraumatic.  Mouth/Throat: Oropharynx is clear and moist.  Eyes: Pupils are equal, round, and reactive to light.  Neck: Normal range of motion.  Cardiovascular: Regular rhythm.   Pulmonary/Chest: Effort normal and breath sounds normal.  Abdominal: Soft. Bowel sounds are normal.  Musculoskeletal: She exhibits tenderness. She exhibits no edema.       Right hip: She exhibits normal range of motion, normal strength, no tenderness, no bony tenderness, no swelling, no crepitus, no deformity and no laceration.  Groin pain with external rotation and flexion  Neurological: She is alert.  Oriented to self only  Skin: Skin is warm.    ED Course  Procedures (including critical care time) Labs Review Labs Reviewed  CBC WITH DIFFERENTIAL   Imaging Review No results found.  EKG Interpretation   None       MDM  No diagnosis found.      Arman Filter, NP 09/23/13 0600

## 2013-09-23 NOTE — Progress Notes (Signed)
INITIAL NUTRITION ASSESSMENT  DOCUMENTATION CODES Per approved criteria  -Obesity Unspecified   INTERVENTION: Provide Ensure Complete once daily Provide Ensure Pudding once daily Encourage PO intake  NUTRITION DIAGNOSIS: Predicted suboptimal energy intake related to poor appetite as evidenced by pt's report of poor appetite and poor po intake 1 week PTA.   Goal: Pt to meet >/= 90% of their estimated nutrition needs   Monitor:  PO intake Weight Labs  Reason for Assessment: Malnutrition Screening Tool, score of 4  79 y.o. female  Admitting Dx: Hip fracture  ASSESSMENT:  In [facility] report pt reported losing 36 lbs and then weight stabilizing. Pt reports usual body weight of 180 lbs; weight history shows weight has been stable over the past 6 months. She reports having a poor appetite today and for one week PTA. Per nursing notes, pt ate 95% of breakfast today. Pt reports drinking Ensure and Boost in the past.   Nutrition Focused Physical Exam:  Subcutaneous Fat:  Orbital Region: wnl Upper Arm Region: wnl Thoracic and Lumbar Region: wnl  Muscle:  Temple Region: wnl Clavicle Bone Region: mild wasting Clavicle and Acromion Bone Region: wnl Scapular Bone Region: NA Dorsal Hand: moderate wasting Patellar Region: wnl Anterior Thigh Region: wnl Posterior Calf Region: wnl  Edema: none  Height: Ht Readings from Last 1 Encounters:  03/14/13 5\' 3"  (1.6 m)    Weight: Wt Readings from Last 1 Encounters:  09/15/13 178 lb (80.74 kg)    Ideal Body Weight: 115 lbs  % Ideal Body Weight: 155%  Wt Readings from Last 10 Encounters:  09/15/13 178 lb (80.74 kg)  03/14/13 178 lb (80.74 kg)  08/23/11 190 lb (86.183 kg)  08/22/11 190 lb (86.183 kg)    Usual Body Weight: 178 lbs  % Usual Body Weight: 101%  BMI:  31.5 kg/(m^2) (Obesity)  Estimated Nutritional Needs: Kcal: 1550-1750 Protein: 80-90 grams Fluid: >/=1.8 L/day  Skin: non-pitting RLE and LLE edema  Diet  Order: Cardiac  EDUCATION NEEDS: -No education needs identified at this time   Intake/Output Summary (Last 24 hours) at 09/23/13 1608 Last data filed at 09/23/13 1401  Gross per 24 hour  Intake    120 ml  Output    600 ml  Net   -480 ml    Last BM: 12/18  Labs:   Recent Labs Lab 09/23/13 0605 09/23/13 0619  NA  --  144  K  --  3.7  CL  --  104  BUN  --  16  CREATININE 0.77 0.90  GLUCOSE  --  97    CBG (last 3)  No results found for this basename: GLUCAP,  in the last 72 hours  Scheduled Meds: . atorvastatin  10 mg Oral q1800  . citalopram  20 mg Oral QHS  . heparin  5,000 Units Subcutaneous Q8H  . Memantine HCl ER  28 mg Oral QHS  . mirabegron ER  25 mg Oral Daily  . multivitamin with minerals  1 tablet Oral Daily  . omega-3 acid ethyl esters  2 g Oral BID  . pantoprazole  40 mg Oral Daily  . rivastigmine  4.6 mg Transdermal Daily    Continuous Infusions:   Past Medical History  Diagnosis Date  . Hypertension   . High cholesterol   . Mitral valve prolapse   . Dementia   . Parkinson's disease, Lewy body   . Unspecified constipation   . Onychia and paronychia of toe   . Spasm of muscle   .  Abnormality of gait   . Orthostatic hypotension   . Major depressive disorder, single episode, unspecified   . Diaphragmatic hernia without mention of obstruction or gangrene   . Alzheimer's disease   . Osteoarthrosis, unspecified whether generalized or localized, unspecified site   . Osteoporosis, unspecified   . Unspecified constipation   . Sprain of ribs   . Delirium due to conditions classified elsewhere   . Diarrhea   . Edema   . Pain in joint, pelvic region and thigh   . Other malaise and fatigue   . Urinary frequency [id number]  . Personal history of fall   . Female stress incontinence   . Other and unspecified hyperlipidemia   . Obesity, unspecified   . Osteoarthrosis, unspecified whether generalized or localized, unspecified site   . Pain in joint,  shoulder region   . Transient ischemic attack (TIA), and cerebral infarction without residual deficits(V12.54)     Past Surgical History  Procedure Laterality Date  . Joint replacement    . Tonsillectomy    . Ankle surgery Left 1998  . Total knee arthroplasty Right 2002  . Total knee arthroplasty Left 2005    Ian Malkin RD, LDN Inpatient Clinical Dietitian Pager: [phone] After Hours Pager: [phone]

## 2013-09-23 NOTE — Progress Notes (Signed)
Clinical Social Work Department BRIEF PSYCHOSOCIAL ASSESSMENT 09/23/2013  Patient:  Holly Grimes, Holly Grimes     Account Number:  [id number]     Admit date:  09/23/2013  Clinical Social Worker:  Candie Chroman  Date/Time:  09/23/2013 12:37 PM  Referred by:  Physician  Date Referred:  09/23/2013 Referred for  SNF Placement   Other Referral:   Interview type:  Family Other interview type:    PSYCHOSOCIAL DATA Living Status:  FAMILY Admitted from facility:   Level of care:   Primary support name:  Andree Coss Primary support relationship to patient:  CHILD, ADULT Degree of support available:   supportive    CURRENT CONCERNS Current Concerns  Post-Acute Placement   Other Concerns:    SOCIAL WORK ASSESSMENT / PLAN Pt is an 79 yr old female with dementia living at home prior to hospital admission. Pt has been living with dau / son in law for the past 4 yrs. Pt was ambulating during the early morning and fell. Pt is scheduled for hip surgery SAT. CSW met w ith pt/family to assist with d/c planning. ST rehab will be needed following hospital d/c. CSW has initiated SNF search and will provide bed offers as received.   Assessment/plan status:  Psychosocial Support/Ongoing Assessment of Needs Other assessment/ plan:   Information/referral to community resources:   SNF list with bed offers to be provided.    PATIENT'S/FAMILY'S RESPONSE TO PLAN OF CARE: " I know rehab will be needed. I'm hoping [facility] will an opening for my mother. "    Cori Razor LCSW [phone]

## 2013-09-24 ENCOUNTER — Encounter (HOSPITAL_COMMUNITY): Payer: Medicare Other | Admitting: Certified Registered Nurse Anesthetist

## 2013-09-24 ENCOUNTER — Inpatient Hospital Stay (HOSPITAL_COMMUNITY): Payer: Medicare Other | Admitting: Certified Registered Nurse Anesthetist

## 2013-09-24 ENCOUNTER — Encounter (HOSPITAL_COMMUNITY): Payer: Self-pay | Admitting: Certified Registered Nurse Anesthetist

## 2013-09-24 ENCOUNTER — Inpatient Hospital Stay (HOSPITAL_COMMUNITY): Payer: Medicare Other

## 2013-09-24 ENCOUNTER — Encounter (HOSPITAL_COMMUNITY)
Admission: EM | Disposition: A | Discharge status: Skilled Nursing Facility | Payer: Self-pay | Source: Home / Self Care | Attending: Internal Medicine

## 2013-09-24 DIAGNOSIS — G20 Parkinson's disease: Secondary | ICD-10-CM

## 2013-09-24 DIAGNOSIS — F0280 Dementia in other diseases classified elsewhere without behavioral disturbance: Secondary | ICD-10-CM

## 2013-09-24 HISTORY — PX: HIP ARTHROPLASTY: SHX981

## 2013-09-24 LAB — CBC
MCH: 33.2 pg (ref 26.0–34.0)
MCHC: 33.5 g/dL (ref 30.0–36.0)
MCV: 99 fL (ref 78.0–100.0)
Platelets: 177 10*3/uL (ref 150–400)
RBC: 4.04 MIL/uL (ref 3.87–5.11)
WBC: 11.1 10*3/uL — ABNORMAL HIGH (ref 4.0–10.5)

## 2013-09-24 LAB — BASIC METABOLIC PANEL
CO2: 24 mEq/L (ref 19–32)
Calcium: 9 mg/dL (ref 8.4–10.5)
Chloride: 101 mEq/L (ref 96–112)
Creatinine, Ser: 0.63 mg/dL (ref 0.50–1.10)
GFR calc non Af Amer: 80 mL/min — ABNORMAL LOW (ref >90)
Glucose, Bld: 118 mg/dL — ABNORMAL HIGH (ref 70–99)
Sodium: 137 mEq/L (ref 135–145)

## 2013-09-24 LAB — SURGICAL PCR SCREEN
MRSA, PCR: POSITIVE — AB
Staphylococcus aureus: POSITIVE — AB

## 2013-09-24 SURGERY — HEMIARTHROPLASTY, HIP, DIRECT ANTERIOR APPROACH, FOR FRACTURE
Anesthesia: General | Site: Hip | Laterality: Right

## 2013-09-24 MED ORDER — FENTANYL CITRATE 0.05 MG/ML IJ SOLN: 25.0000 ug | INTRAMUSCULAR | Status: DC | PRN

## 2013-09-24 MED ORDER — LIDOCAINE HCL (CARDIAC) 20 MG/ML IV SOLN
INTRAVENOUS | Status: AC
  Filled 2013-09-24: qty 5

## 2013-09-24 MED ORDER — SODIUM CHLORIDE 0.9 % IV SOLN
INTRAVENOUS | Status: DC
  Administered 2013-09-24: 13:00:00 via INTRAVENOUS
  Administered 2013-09-25: 1000 mL via INTRAVENOUS

## 2013-09-24 MED ORDER — ATROPINE SULFATE 0.4 MG/ML IJ SOLN
INTRAMUSCULAR | Status: AC
  Filled 2013-09-24: qty 1

## 2013-09-24 MED ORDER — FENTANYL CITRATE 0.05 MG/ML IJ SOLN
INTRAMUSCULAR | Status: AC
  Filled 2013-09-24: qty 2

## 2013-09-24 MED ORDER — METHOCARBAMOL 500 MG PO TABS: 500.0000 mg | ORAL_TABLET | Freq: Four times a day (QID) | ORAL | Status: DC | PRN

## 2013-09-24 MED ORDER — PHENOL 1.4 % MT LIQD: 1.0000 | OROMUCOSAL | Status: DC | PRN

## 2013-09-24 MED ORDER — POLYETHYLENE GLYCOL 3350 17 G PO PACK: 17.0000 g | PACK | Freq: Every day | ORAL | Status: DC | PRN

## 2013-09-24 MED ORDER — FENTANYL CITRATE 0.05 MG/ML IJ SOLN
INTRAMUSCULAR | Status: DC | PRN
  Administered 2013-09-24 (×4): 25 ug via INTRAVENOUS

## 2013-09-24 MED ORDER — DEXAMETHASONE SODIUM PHOSPHATE 10 MG/ML IJ SOLN
INTRAMUSCULAR | Status: AC
  Filled 2013-09-24: qty 1

## 2013-09-24 MED ORDER — SODIUM CHLORIDE 0.9 % IR SOLN
Status: DC | PRN
  Administered 2013-09-24: 1000 mL

## 2013-09-24 MED ORDER — MENTHOL 3 MG MT LOZG: 1.0000 | LOZENGE | OROMUCOSAL | Status: DC | PRN

## 2013-09-24 MED ORDER — ONDANSETRON HCL 4 MG/2ML IJ SOLN
INTRAMUSCULAR | Status: DC | PRN
  Administered 2013-09-24: 4 mg via INTRAVENOUS

## 2013-09-24 MED ORDER — SUCCINYLCHOLINE CHLORIDE 20 MG/ML IJ SOLN
INTRAMUSCULAR | Status: DC | PRN
  Administered 2013-09-24: 100 mg via INTRAVENOUS

## 2013-09-24 MED ORDER — MEPERIDINE HCL 50 MG/ML IJ SOLN
6.2500 mg | INTRAMUSCULAR | Status: DC | PRN
Start: 2013-09-24 — End: 2013-09-24

## 2013-09-24 MED ORDER — METOCLOPRAMIDE HCL 5 MG/ML IJ SOLN: 5.0000 mg | Freq: Three times a day (TID) | INTRAMUSCULAR | Status: DC | PRN

## 2013-09-24 MED ORDER — EPHEDRINE SULFATE 50 MG/ML IJ SOLN
INTRAMUSCULAR | Status: AC
  Filled 2013-09-24: qty 1

## 2013-09-24 MED ORDER — ASPIRIN EC 325 MG PO TBEC
325.0000 mg | DELAYED_RELEASE_TABLET | Freq: Every day | ORAL | Status: DC
  Filled 2013-09-24: qty 1

## 2013-09-24 MED ORDER — ONDANSETRON HCL 4 MG/2ML IJ SOLN
4.0000 mg | Freq: Four times a day (QID) | INTRAMUSCULAR | Status: DC | PRN
  Administered 2013-09-25: 4 mg via INTRAVENOUS
  Filled 2013-09-24: qty 2

## 2013-09-24 MED ORDER — HYDROMORPHONE HCL PF 1 MG/ML IJ SOLN: 0.5000 mg | INTRAMUSCULAR | Status: DC | PRN

## 2013-09-24 MED ORDER — EPHEDRINE SULFATE 50 MG/ML IJ SOLN
INTRAMUSCULAR | Status: DC | PRN
  Administered 2013-09-24: 5 mg via INTRAVENOUS
  Administered 2013-09-24: 10 mg via INTRAVENOUS

## 2013-09-24 MED ORDER — SUCCINYLCHOLINE CHLORIDE 20 MG/ML IJ SOLN
INTRAMUSCULAR | Status: AC
  Filled 2013-09-24: qty 1

## 2013-09-24 MED ORDER — METHOCARBAMOL 100 MG/ML IJ SOLN
500.0000 mg | Freq: Four times a day (QID) | INTRAVENOUS | Status: DC | PRN
  Filled 2013-09-24: qty 5

## 2013-09-24 MED ORDER — ACETAMINOPHEN 650 MG RE SUPP: 650.0000 mg | Freq: Four times a day (QID) | RECTAL | Status: DC | PRN

## 2013-09-24 MED ORDER — 0.9 % SODIUM CHLORIDE (POUR BTL) OPTIME
TOPICAL | Status: DC | PRN
  Administered 2013-09-24: 1000 mL

## 2013-09-24 MED ORDER — PROPOFOL 10 MG/ML IV BOLUS
INTRAVENOUS | Status: AC
  Filled 2013-09-24: qty 20

## 2013-09-24 MED ORDER — NEOSTIGMINE METHYLSULFATE 1 MG/ML IJ SOLN
INTRAMUSCULAR | Status: AC
  Filled 2013-09-24: qty 10

## 2013-09-24 MED ORDER — BISACODYL 10 MG RE SUPP: 10.0000 mg | Freq: Every day | RECTAL | Status: DC | PRN

## 2013-09-24 MED ORDER — DOCUSATE SODIUM 100 MG PO CAPS
100.0000 mg | ORAL_CAPSULE | Freq: Two times a day (BID) | ORAL | Status: DC
  Administered 2013-09-24 – 2013-09-27 (×6): 100 mg via ORAL

## 2013-09-24 MED ORDER — DEXAMETHASONE SODIUM PHOSPHATE 10 MG/ML IJ SOLN
INTRAMUSCULAR | Status: DC | PRN
  Administered 2013-09-24: 10 mg via INTRAVENOUS

## 2013-09-24 MED ORDER — LACTATED RINGERS IV SOLN
INTRAVENOUS | Status: DC | PRN
  Administered 2013-09-24: 07:00:00 via INTRAVENOUS

## 2013-09-24 MED ORDER — CEFAZOLIN SODIUM-DEXTROSE 2-3 GM-% IV SOLR
INTRAVENOUS | Status: AC
  Filled 2013-09-24: qty 50

## 2013-09-24 MED ORDER — PROPOFOL 10 MG/ML IV BOLUS
INTRAVENOUS | Status: DC | PRN
  Administered 2013-09-24: 100 mg via INTRAVENOUS

## 2013-09-24 MED ORDER — PHENYLEPHRINE HCL 10 MG/ML IJ SOLN
INTRAMUSCULAR | Status: DC | PRN
  Administered 2013-09-24: 40 ug via INTRAVENOUS
  Administered 2013-09-24: 80 ug via INTRAVENOUS

## 2013-09-24 MED ORDER — HYDROMORPHONE HCL PF 1 MG/ML IJ SOLN: 0.1000 mg | INTRAMUSCULAR | Status: DC | PRN

## 2013-09-24 MED ORDER — CEFAZOLIN SODIUM-DEXTROSE 2-3 GM-% IV SOLR
2.0000 g | Freq: Four times a day (QID) | INTRAVENOUS | Status: AC
  Administered 2013-09-24 (×2): 2 g via INTRAVENOUS
  Filled 2013-09-24 (×2): qty 50

## 2013-09-24 MED ORDER — HYDROCODONE-ACETAMINOPHEN 5-325 MG PO TABS
1.0000 | ORAL_TABLET | Freq: Four times a day (QID) | ORAL | Status: DC | PRN
  Filled 2013-09-24: qty 1

## 2013-09-24 MED ORDER — ROCURONIUM BROMIDE 100 MG/10ML IV SOLN
INTRAVENOUS | Status: AC
  Filled 2013-09-24: qty 1

## 2013-09-24 MED ORDER — LIDOCAINE HCL (CARDIAC) 20 MG/ML IV SOLN
INTRAVENOUS | Status: DC | PRN
  Administered 2013-09-24: 100 mg via INTRAVENOUS

## 2013-09-24 MED ORDER — GLYCOPYRROLATE 0.2 MG/ML IJ SOLN
INTRAMUSCULAR | Status: AC
  Filled 2013-09-24: qty 3

## 2013-09-24 MED ORDER — ROCURONIUM BROMIDE 100 MG/10ML IV SOLN
INTRAVENOUS | Status: DC | PRN
  Administered 2013-09-24: 25 mg via INTRAVENOUS

## 2013-09-24 MED ORDER — METOCLOPRAMIDE HCL 5 MG PO TABS
5.0000 mg | ORAL_TABLET | Freq: Three times a day (TID) | ORAL | Status: DC | PRN
  Filled 2013-09-24: qty 2

## 2013-09-24 MED ORDER — NEOSTIGMINE METHYLSULFATE 1 MG/ML IJ SOLN
INTRAMUSCULAR | Status: DC | PRN
  Administered 2013-09-24: 4 mg via INTRAVENOUS

## 2013-09-24 MED ORDER — ACETAMINOPHEN 325 MG PO TABS
650.0000 mg | ORAL_TABLET | Freq: Four times a day (QID) | ORAL | Status: DC | PRN
  Administered 2013-09-24 – 2013-09-26 (×8): 650 mg via ORAL
  Filled 2013-09-24: qty 2

## 2013-09-24 MED ORDER — CEFAZOLIN SODIUM-DEXTROSE 2-3 GM-% IV SOLR
2.0000 g | INTRAVENOUS | Status: AC
  Administered 2013-09-24: 2 g via INTRAVENOUS

## 2013-09-24 MED ORDER — ONDANSETRON HCL 4 MG PO TABS: 4.0000 mg | ORAL_TABLET | Freq: Four times a day (QID) | ORAL | Status: DC | PRN

## 2013-09-24 MED ORDER — ONDANSETRON HCL 4 MG/2ML IJ SOLN
INTRAMUSCULAR | Status: AC
  Filled 2013-09-24: qty 2

## 2013-09-24 MED ORDER — FLEET ENEMA 7-19 GM/118ML RE ENEM: 1.0000 | ENEMA | Freq: Once | RECTAL | Status: AC | PRN

## 2013-09-24 MED ORDER — SODIUM CHLORIDE 0.9 % IJ SOLN
INTRAMUSCULAR | Status: AC
  Filled 2013-09-24: qty 10

## 2013-09-24 MED ORDER — CLOPIDOGREL BISULFATE 75 MG PO TABS
75.0000 mg | ORAL_TABLET | Freq: Every day | ORAL | Status: DC
  Administered 2013-09-25 – 2013-09-27 (×3): 75 mg via ORAL
  Filled 2013-09-24 (×4): qty 1

## 2013-09-24 MED ORDER — GLYCOPYRROLATE 0.2 MG/ML IJ SOLN
INTRAMUSCULAR | Status: DC | PRN
  Administered 2013-09-24: .5 mg via INTRAVENOUS

## 2013-09-24 SURGICAL SUPPLY — 50 items
BAG ZIPLOCK 12X15 (MISCELLANEOUS) ×2 IMPLANT
BIT DRILL 2.8X128 (BIT) ×2 IMPLANT
BLADE SAW SAG 73X25 THK (BLADE) ×1
BLADE SAW SGTL 73X25 THK (BLADE) ×1 IMPLANT
BRUSH FEMORAL CANAL (MISCELLANEOUS) ×2 IMPLANT
CAPT HIP FX BIPOLAR/UNIPOLAR ×2 IMPLANT
CEMENT BONE DEPUY (Cement) ×4 IMPLANT
CEMENT RESTRICTOR DEPUY SZ 3 (Cement) ×2 IMPLANT
DRAPE INCISE IOBAN 66X45 STRL (DRAPES) ×2 IMPLANT
DRAPE ORTHO SPLIT 77X108 STRL (DRAPES) ×1
DRAPE POUCH INSTRU U-SHP 10X18 (DRAPES) ×2 IMPLANT
DRAPE SURG ORHT 6 SPLT 77X108 (DRAPES) ×1 IMPLANT
DRAPE U-SHAPE 47X51 STRL (DRAPES) ×2 IMPLANT
DRSG ADAPTIC 3X8 NADH LF (GAUZE/BANDAGES/DRESSINGS) ×2 IMPLANT
DRSG EMULSION OIL 3X16 NADH (GAUZE/BANDAGES/DRESSINGS) ×2 IMPLANT
DRSG MEPILEX BORDER 4X4 (GAUZE/BANDAGES/DRESSINGS) ×2 IMPLANT
DRSG MEPILEX BORDER 4X8 (GAUZE/BANDAGES/DRESSINGS) ×2 IMPLANT
DURAPREP 26ML APPLICATOR (WOUND CARE) ×2 IMPLANT
ELECT REM PT RETURN 9FT ADLT (ELECTROSURGICAL) ×2
ELECTRODE REM PT RTRN 9FT ADLT (ELECTROSURGICAL) ×1 IMPLANT
EVACUATOR 1/8 PVC DRAIN (DRAIN) ×2 IMPLANT
FACESHIELD LNG OPTICON STERILE (SAFETY) ×6 IMPLANT
GLOVE BIO SURGEON STRL SZ7.5 (GLOVE) ×2 IMPLANT
GLOVE BIO SURGEON STRL SZ8 (GLOVE) ×4 IMPLANT
GLOVE BIOGEL PI IND STRL 8 (GLOVE) ×1 IMPLANT
GLOVE BIOGEL PI INDICATOR 8 (GLOVE) ×1
GOWN PREVENTION PLUS LG XLONG (DISPOSABLE) ×2 IMPLANT
GOWN STRL REIN XL XLG (GOWN DISPOSABLE) ×2 IMPLANT
HANDPIECE INTERPULSE COAX TIP (DISPOSABLE) ×1
IMMOBILIZER KNEE 20 (SOFTGOODS) ×2
IMMOBILIZER KNEE 20 THIGH 36 (SOFTGOODS) ×1 IMPLANT
KIT BASIN OR (CUSTOM PROCEDURE TRAY) ×2 IMPLANT
MANIFOLD NEPTUNE II (INSTRUMENTS) ×2 IMPLANT
PACK TOTAL JOINT (CUSTOM PROCEDURE TRAY) ×2 IMPLANT
PAD ABD 8X10 STRL (GAUZE/BANDAGES/DRESSINGS) ×2 IMPLANT
PASSER SUT SWANSON 36MM LOOP (INSTRUMENTS) ×2 IMPLANT
POSITIONER SURGICAL ARM (MISCELLANEOUS) ×2 IMPLANT
PRESSURIZER FEMORAL UNIV (MISCELLANEOUS) ×2 IMPLANT
SET HNDPC FAN SPRY TIP SCT (DISPOSABLE) ×1 IMPLANT
SPONGE GAUZE 4X4 12PLY (GAUZE/BANDAGES/DRESSINGS) ×2 IMPLANT
STRIP CLOSURE SKIN 1/2X4 (GAUZE/BANDAGES/DRESSINGS) ×2 IMPLANT
SUT ETHIBOND NAB CT1 #1 30IN (SUTURE) ×4 IMPLANT
SUT MNCRL AB 4-0 PS2 18 (SUTURE) ×2 IMPLANT
SUT VIC AB 1 CT1 27 (SUTURE) ×3
SUT VIC AB 1 CT1 27XBRD ANTBC (SUTURE) ×3 IMPLANT
SUT VIC AB 2-0 CT1 27 (SUTURE) ×3
SUT VIC AB 2-0 CT1 TAPERPNT 27 (SUTURE) ×3 IMPLANT
SUT VLOC 180 0 24IN GS25 (SUTURE) ×4 IMPLANT
TOWEL OR 17X26 10 PK STRL BLUE (TOWEL DISPOSABLE) ×4 IMPLANT
TOWER CARTRIDGE SMART MIX (DISPOSABLE) ×2 IMPLANT

## 2013-09-24 NOTE — Op Note (Signed)
OPERATIVE REPORT   PREOPERATIVE DIAGNOSIS:  Right femoral neck fracture displaced.   POSTOPERATIVE DIAGNOSIS:  Right femoral neck fracture displaced.   PROCEDURE:  Right hip hemiarthroplasty.   SURGEON: Ollen Gross, M.D.   ASSISTANT: Irish Elders, PA-C  ANESTHESIA: General  ESTIMATED BLOOD LOSS:200 ml    DRAINS: Hemovac x1.   COMPLICATIONS: None.   CONDITION: Stable to recovery.   BRIEF CLINICAL NOTE: Holly Grimes is an 79 y.o. female with  significant dementia who had a fall yesterday leading to a displaced   Right femoral neck fracture. She was evaluated in the emergency  department and admitted to the medical service. She was cleared for  surgery and presents for right hip hemiarthroplasty.   PROCEDURE IN DETAIL: After successful administration of general  anesthetic, the patient was placed in the Left lateral decubitus  position with the Right side up and held with the hip positioner.Right lower extremity was isolated from her perineum with plastic drapes and  prepped and draped in the usual sterile fashion. Short posterolateral  incision was made with a 10 blade through the subcutaneous tissue to the  level of fascia lata which was incised in line with the skin incision.  The sciatic nerve was palpated and protected, and short rotators and  capsule were isolated off the femur. There was complete fracture of the  femoral neck. The femoral head was removed. Diameters 45 mm. We then  isolated the femur with retractors and freshened up the femoral neck cut  with an oscillating saw. I used a starter reamer to get into the  femoral canal and then irrigated with saline. A lateralizing reamer was  then placed. We then broached up to a size 4, which had excellent  rotational and torsional stability. Trial neck for the size 4 with a  28 + 1.5 head and 45 bipolar was placed. Hip was reduced with excellent  stability. I placed the right leg on top of the left leg, and lengths   were equal. Hip was dislocated. All trials were removed. We trialed  the cement restrictor, and size 3 was most appropriate. The size 11 mm  cement restrictor was placed at the appropriate depth in the femoral  canal. Canal was then prepared with pulsatile lavage and thoroughly  dried. Cement was mixed and once ready implantation was injected into  the femoral canal and pressurized. The size 4 [place] basic  cemented stem was then cemented into place in about 20 degrees of  anteversion. When the cement was fully hardened, then the permanent 28 + 1.5  head and 45 bipolar components were placed. Hip was reduced with  same stability parameters. Capsule and short rotators reattached to the  femur through drill holes with Ethibond suture. Fascia lata was closed  over Hemovac drain with running #1 V-Loc suture. Subcu closed with 2-0  Vicryl, and subcuticular running 4-0 Monocryl. The drains were hooked  to suction. Incision was cleaned and dried and Steri-Strips and a bulky  sterile dressing applied. She was then placed into a knee immobilizer,  awakened and transported to recovery in stable condition.   Ollen Gross, M.D.

## 2013-09-24 NOTE — Progress Notes (Signed)
X-ray results noted 

## 2013-09-24 NOTE — Progress Notes (Signed)
Pt. Returned from PACU. Responds to stimuli alert. Right hip with foam dressing dry and intact. Hemovac intact to suction. Foley intact, draining amber urine. Family at bedside. Continue to assess and monitor.

## 2013-09-24 NOTE — Anesthesia Postprocedure Evaluation (Signed)
  Anesthesia Post Note  Patient: Holly Grimes  Procedure(s) Performed: Procedure(s) (LRB): ARTHROPLASTY BIPOLAR HIP (Right)  Anesthesia type: GA  Patient location: PACU  Post pain: Pain level controlled  Post assessment: Post-op Vital signs reviewed  Last Vitals:  Filed Vitals:   09/24/13 1005  BP:   Pulse: 81  Temp:   Resp: 17    Post vital signs: Reviewed  Level of consciousness: sedated  Complications: No apparent anesthesia complications

## 2013-09-24 NOTE — Transfer of Care (Signed)
Immediate Anesthesia Transfer of Care Note  Patient: Holly Grimes  Procedure(s) Performed: Procedure(s) (LRB): ARTHROPLASTY BIPOLAR HIP (Right)  Patient Location: PACU  Anesthesia Type: General  Level of Consciousness: sedated, patient cooperative and responds to stimulation  Airway & Oxygen Therapy: Patient Spontanous Breathing and Patient connected to face mask oxgen  Post-op Assessment: Report given to PACU RN and Post -op Vital signs reviewed and stable  Post vital signs: Reviewed and stable  Complications: No apparent anesthesia complications

## 2013-09-24 NOTE — Progress Notes (Signed)
[facility] PROGRESS NOTE  Holly Grimes ZOX:[id number] DOB: 1926-10-09 DOA: 09/23/2013 PCP: Bufford Spikes, DO  Assessment/Plan: Right Femoral Neck Hip Fracture  -s/p R hip hemiarthroplasty -start lovenox in am -continue plavix, hold ASA -Ortho to determine adequate post-hip DVT prophylaxis.  -PT/OT after surgery -CSW consulted -limit narcotics  H/o HTN with orthostatic hypotension  -stable -Follow.   Dementia  -Continue namenda and exelon patch.   DVT Prophylaxis  -SQ heparin for now    Consultants: Dr.Alusio  Procedures: PROCEDURE: Right hip hemiarthroplasty.   HPI/Subjective: Just back from PACU  Objective: Filed Vitals:   09/24/13 1330  BP: 137/83  Pulse: 93  Temp: 97.6 F (36.4 C)  Resp: 18    Intake/Output Summary (Last 24 hours) at 09/24/13 1743 Last data filed at 09/24/13 1500  Gross per 24 hour  Intake   2130 ml  Output   1428 ml  Net    702 ml   There were no vitals filed for this visit.  Exam:   General:  Sleeping, just back from PACU  Cardiovascular: S!S2/RRR  RespiratoryCTAB  Abdomen: soft, NT, BS present  Musculoskeletal:R leg dressed  Data Reviewed: Basic Metabolic Panel:  Recent Labs Lab 09/23/13 0605 09/23/13 0619 09/24/13 0620  NA  --  144 137  K  --  3.7 3.8  CL  --  104 101  CO2  --   --  24  GLUCOSE  --  97 118*  BUN  --  16 27*  CREATININE 0.77 0.90 0.63  CALCIUM  --   --  9.0   Liver Function Tests: No results found for this basename: AST, ALT, ALKPHOS, BILITOT, PROT, ALBUMIN,  in the last 168 hours No results found for this basename: LIPASE, AMYLASE,  in the last 168 hours No results found for this basename: AMMONIA,  in the last 168 hours CBC:  Recent Labs Lab 09/23/13 0605 09/23/13 0619 09/24/13 0620  WBC 7.1  --  11.1*  NEUTROABS 5.3  --   --   HGB 13.8 14.6 13.4  HCT 40.3 43.0 40.0  MCV 97.6  --  99.0  PLT 179  --  177   Cardiac Enzymes: No results found for this basename:  CKTOTAL, CKMB, CKMBINDEX, TROPONINI,  in the last 168 hours BNP (last 3 results) No results found for this basename: PROBNP,  in the last 8760 hours CBG: No results found for this basename: GLUCAP,  in the last 168 hours  Recent Results (from the past 240 hour(s))  SURGICAL PCR SCREEN     Status: Abnormal   Collection Time    09/23/13 10:33 PM      Result Value Range Status   MRSA, PCR POSITIVE (*) NEGATIVE Final   Comment: RESULT CALLED TO, READ BACK BY AND VERIFIED WITH:     JCRUTCHFILED AT 0033 ON [id number] BY DLONG   Staphylococcus aureus POSITIVE (*) NEGATIVE Final   Comment:            The Xpert SA Assay (FDA     approved for NASAL specimens     in patients over 14 years of age),     is one component of     a comprehensive surveillance     program.  Test performance has     been validated by [employer] for patients greater     than or equal to 5 year old.     It is not intended  to diagnose infection nor to     guide or monitor treatment.     Studies: Dg Chest 1 View  09/23/2013   CLINICAL DATA:  Preop for hip fracture  EXAM: CHEST - 1 VIEW  COMPARISON:  04/23/2012  FINDINGS: Mild cardiomegaly. Previously seen hiatal hernia not seen currently. No acute infiltrate or edema. No effusion or pneumothorax. There are granulomatous changes to the lower lungs. Glenohumeral osteoarthritis.  IMPRESSION: No evidence of acute cardiopulmonary disease.   Electronically Signed   By: Tiburcio Pea M.D.   On: 09/23/2013 06:03   Dg Hip Complete Right  09/23/2013   CLINICAL DATA:  Fall with right hip pain  EXAM: RIGHT HIP - COMPLETE 2+ VIEW  COMPARISON:  08/22/2011  FINDINGS: Subcapital right femoral neck fracture with impaction of the upper margin. The femoral head remains located. No evidence of pelvic ring fracture or diastasis. No significant hip joint narrowing. Lower lumbar degenerative disc and facet disease.  IMPRESSION: Acute, subcapital right femoral neck fracture.    Electronically Signed   By: Tiburcio Pea M.D.   On: 09/23/2013 06:01   Dg Pelvis Portable  09/24/2013   CLINICAL DATA:  Status post right bipolar hip prosthesis placement.  EXAM: PORTABLE PELVIS 1-2 VIEWS  COMPARISON:  Right hip series dated December 19th.  2014.  FINDINGS: The patient has undergone right hip joint prosthesis placement. Radiographic positioning of the prosthetic components is good. A surgical drain line is present. The native bone and its interface with the femoral component of the prosthesis appears normal.  IMPRESSION: The patient has undergone right hip joint prosthesis placement. Radiographic positioning appears good.   Electronically Signed   By: David  [place]   On: 09/24/2013 09:47    Scheduled Meds: . Melene Muller ON 09/25/2013] aspirin EC  325 mg Oral Q breakfast  . atorvastatin  10 mg Oral q1800  .  ceFAZolin (ANCEF) IV  2 g Intravenous Q6H  . citalopram  20 mg Oral QHS  . [START ON 09/25/2013] clopidogrel  75 mg Oral Q breakfast  . docusate sodium  100 mg Oral BID  . feeding supplement (ENSURE COMPLETE)  237 mL Oral Q24H  . feeding supplement (ENSURE)  1 Container Oral Q24H  . Memantine HCl ER  28 mg Oral QHS  . mirabegron ER  25 mg Oral Daily  . multivitamin with minerals  1 tablet Oral Daily  . omega-3 acid ethyl esters  2 g Oral BID  . pantoprazole  40 mg Oral Daily  . rivastigmine  4.6 mg Transdermal Daily   Continuous Infusions: . sodium chloride 75 mL/hr at 09/24/13 1248    Principal Problem:   Hip fracture Active Problems:   Orthostatic hypotension   Hip fracture requiring operative repair    Time spent:    [facility]  [facility] Pager [phone]. If 7PM-7AM, please contact night-coverage at [email or web address], password [facility] 09/24/2013, 5:43 PM  LOS: 1 day

## 2013-09-24 NOTE — Preoperative (Signed)
Beta Blockers   Reason not to administer Beta Blockers:Not Applicable 

## 2013-09-24 NOTE — Progress Notes (Signed)
Portable AP Pelvis X-ray done. 

## 2013-09-24 NOTE — Anesthesia Preprocedure Evaluation (Signed)
Anesthesia Evaluation  Patient identified by MRN, date of birth, ID band Patient awake    Reviewed: Allergy & Precautions, H&P , Patient's Chart, lab work & pertinent test results, reviewed documented beta blocker date and time   History of Anesthesia Complications Negative for: history of anesthetic complications  Airway Mallampati: II TM Distance: >3 FB Neck ROM: full    Dental   Pulmonary  breath sounds clear to auscultation        Cardiovascular Exercise Tolerance: Good hypertension, + Valvular Problems/Murmurs MVP Rhythm:regular Rate:Normal     Neuro/Psych PSYCHIATRIC DISORDERS TIA Neuromuscular disease    GI/Hepatic   Endo/Other    Renal/GU      Musculoskeletal   Abdominal   Peds  Hematology   Anesthesia Other Findings   Reproductive/Obstetrics                           Anesthesia Physical Anesthesia Plan  ASA: III  Anesthesia Plan: General ETT   Post-op Pain Management:    Induction:   Airway Management Planned:   Additional Equipment:   Intra-op Plan:   Post-operative Plan:   Informed Consent: I have reviewed the patients History and Physical, chart, labs and discussed the procedure including the risks, benefits and alternatives for the proposed anesthesia with the patient or authorized representative who has indicated his/her understanding and acceptance.   Dental Advisory Given  Plan Discussed with: CRNA and Surgeon  Anesthesia Plan Comments:         Anesthesia Quick Evaluation

## 2013-09-25 LAB — BASIC METABOLIC PANEL
CO2: 26 mEq/L (ref 19–32)
Calcium: 8.6 mg/dL (ref 8.4–10.5)
Chloride: 106 mEq/L (ref 96–112)
GFR calc Af Amer: >90 mL/min (ref >90)
Sodium: 139 mEq/L (ref 135–145)

## 2013-09-25 LAB — CBC
Hemoglobin: 10.1 g/dL — ABNORMAL LOW (ref 12.0–15.0)
MCH: 32.9 pg (ref 26.0–34.0)
Platelets: 142 10*3/uL — ABNORMAL LOW (ref 150–400)
RBC: 3.07 MIL/uL — ABNORMAL LOW (ref 3.87–5.11)
RDW: 12.3 % (ref 11.5–15.5)
WBC: 14.4 10*3/uL — ABNORMAL HIGH (ref 4.0–10.5)

## 2013-09-25 MED ORDER — ENOXAPARIN SODIUM 40 MG/0.4ML ~~LOC~~ SOLN
40.0000 mg | SUBCUTANEOUS | Status: DC
  Administered 2013-09-25 – 2013-09-27 (×3): 40 mg via SUBCUTANEOUS
  Filled 2013-09-25 (×3): qty 0.4

## 2013-09-25 NOTE — Progress Notes (Signed)
Subjective: 1 Day Post-Op Procedure(s) (LRB): ARTHROPLASTY BIPOLAR HIP (Right) Patient reports pain as 3 on 0-10 scale.Hemovac DCd. Patient has Dementia. Case discussed with her Daughter.    Objective: Vital signs in last 24 hours: Temp:  [97.6 F (36.4 C)-99.4 F (37.4 C)] 98.3 F (36.8 C) (12/21 0130) Pulse Rate:  [72-111] 72 (12/21 0130) Resp:  [14-24] 18 (12/21 0130) BP: (115-196)/(70-113) 148/88 mmHg (12/21 0130) SpO2:  [93 %-100 %] 99 % (12/21 0130)  Intake/Output from previous day: 12/20 0701 - 12/21 0700 In: 3090 [P.O.:240; I.V.:2800; IV Piggyback:50] Out: 553 [Urine:350; Drains:3; Blood:200] Intake/Output this shift:     Recent Labs  09/23/13 0605 09/23/13 0619 09/24/13 0620 09/25/13 0516  HGB 13.8 14.6 13.4 10.1*    Recent Labs  09/24/13 0620 09/25/13 0516  WBC 11.1* 14.4*  RBC 4.04 3.07*  HCT 40.0 30.0*  PLT 177 142*    Recent Labs  09/24/13 0620 09/25/13 0516  NA 137 139  K 3.8 3.8  CL 101 106  CO2 24 26  BUN 27* 19  CREATININE 0.63 0.63  GLUCOSE 118* 132*  CALCIUM 9.0 8.6    Recent Labs  09/23/13 0630  INR 1.01    No cellulitis present  Assessment/Plan: 1 Day Post-Op Procedure(s) (LRB): ARTHROPLASTY BIPOLAR HIP (Right) Up with therapy. Plan to DC to SNF  Marley Charlot A 09/25/2013, 8:22 AM

## 2013-09-25 NOTE — Plan of Care (Signed)
Problem: Phase III Progression Outcomes Goal: Other Phase III Outcomes/Goals Outcome: Completed/Met Date Met:  09/25/13 Plan Discharge to Rehab/SNF

## 2013-09-25 NOTE — Evaluation (Signed)
Physical Therapy Evaluation Patient Details Name: Holly Grimes MRN: [id number] DOB: 1927-06-09 Today's Date: 09/25/2013 Time: 9562-1308 PT Time Calculation (min): 43 min  PT Assessment / Plan / Recommendation History of Present Illness  s/p right hip hemiarthroplasty  Clinical Impression  Pt will benefit from PT to address deficits below    PT Assessment  Patient needs continued PT services    Follow Up Recommendations  SNF    Does the patient have the potential to tolerate intense rehabilitation      Barriers to Discharge        Equipment Recommendations  None recommended by PT    Recommendations for Other Services     Frequency Min 3X/week    Precautions / Restrictions Precautions Precautions: Posterior Hip;Fall Restrictions RLE Weight Bearing: Weight bearing as tolerated   Pertinent Vitals/Pain Min c/o pain with mobility      Mobility  Bed Mobility Bed Mobility: Supine to Sit Supine to Sit: 1: +2 Total assist Supine to Sit: Patient Percentage: 10% Details for Bed Mobility Assistance: cues for self assist , technique, precautions; +2 for safety, Ub and LB assist Transfers Transfers: Sit to Stand;Stand to Sit;Stand Pivot Transfers Sit to Stand: 1: +2 Total assist Sit to Stand: Patient Percentage: 10% Stand to Sit: 1: +2 Total assist Stand to Sit: Patient Percentage: 10% Stand Pivot Transfers: 1: +2 Total assist Stand Pivot Transfers: Patient Percentage: 20% Details for Transfer Assistance: +2 for wt shift, balance, precautions; 2 attempts to stand Ambulation/Gait Ambulation/Gait Assistance: Not tested (comment)    Exercises     PT Diagnosis: Difficulty walking;Generalized weakness  PT Problem List: Decreased mobility;Decreased activity tolerance;Decreased balance;Decreased knowledge of precautions;Decreased safety awareness;Decreased strength;Decreased range of motion PT Treatment Interventions: DME instruction;Gait training;Functional mobility  training;Therapeutic activities;Therapeutic exercise;Patient/family education     PT Goals(Current goals can be found in the care plan section) Acute Rehab PT Goals Patient Stated Goal: family goal of SNF at this time PT Goal Formulation: With patient Time For Goal Achievement: 10/02/13 Potential to Achieve Goals: Good  Visit Information  Last PT Received On: 09/25/13 Assistance Needed: +2 History of Present Illness: s/p right hip hemiarthroplasty       Prior Functioning  Home Living Family/patient expects to be discharged to:: Skilled nursing facility Living Arrangements: Children Additional Comments: dtr wants to wait and see how pt does, may  Prior Function Level of Independence: Needs assistance Gait / Transfers Assistance Needed: amb  with 4 wheeled walker and assist  ADL's / Homemaking Assistance Needed: dtr assists to speed up process; she could do with supervison/set-up per dtr; feeds I; incontinent; Communication Communication: No difficulties    Cognition  Cognition Arousal/Alertness:  (but arouses easily) Behavior During Therapy: WFL for tasks assessed/performed Overall Cognitive Status: History of cognitive impairments - at baseline    Extremity/Trunk Assessment Upper Extremity Assessment Upper Extremity Assessment: Overall WFL for tasks assessed Lower Extremity Assessment Lower Extremity Assessment: RLE deficits/detail RLE: Unable to fully assess due to pain   Balance    End of Session PT - End of Session Equipment Utilized During Treatment: Gait belt Patient left: in chair;with call bell/phone within reach;with family/visitor present Nurse Communication: Mobility status  GP     [facility] 09/25/2013, 1:52 PM

## 2013-09-26 ENCOUNTER — Encounter (HOSPITAL_COMMUNITY): Payer: Self-pay | Admitting: Orthopedic Surgery

## 2013-09-26 DIAGNOSIS — F0390 Unspecified dementia without behavioral disturbance: Secondary | ICD-10-CM | POA: Diagnosis present

## 2013-09-26 DIAGNOSIS — R350 Frequency of micturition: Secondary | ICD-10-CM

## 2013-09-26 LAB — BASIC METABOLIC PANEL
BUN: 12 mg/dL (ref 6–23)
CO2: 26 mEq/L (ref 19–32)
Calcium: 8.4 mg/dL (ref 8.4–10.5)
GFR calc Af Amer: 88 mL/min — ABNORMAL LOW (ref >90)
GFR calc non Af Amer: 76 mL/min — ABNORMAL LOW (ref >90)
Glucose, Bld: 91 mg/dL (ref 70–99)
Sodium: 135 mEq/L (ref 135–145)

## 2013-09-26 LAB — CBC
HCT: 27.4 % — ABNORMAL LOW (ref 36.0–46.0)
MCHC: 32.8 g/dL (ref 30.0–36.0)
MCV: 100.4 fL — ABNORMAL HIGH (ref 78.0–100.0)
Platelets: 145 10*3/uL — ABNORMAL LOW (ref 150–400)
RBC: 2.73 MIL/uL — ABNORMAL LOW (ref 3.87–5.11)
RDW: 12.6 % (ref 11.5–15.5)
WBC: 8.2 10*3/uL (ref 4.0–10.5)

## 2013-09-26 MED ORDER — ACETAMINOPHEN 325 MG PO TABS: 650.0000 mg | ORAL_TABLET | Freq: Four times a day (QID) | ORAL | Status: DC | PRN

## 2013-09-26 MED ORDER — ACETAMINOPHEN 325 MG PO TABS: 650.0000 mg | ORAL_TABLET | Freq: Four times a day (QID) | ORAL | Status: DC

## 2013-09-26 MED ORDER — TRAMADOL HCL 50 MG PO TABS: 50.0000 mg | ORAL_TABLET | Freq: Four times a day (QID) | ORAL | Status: DC | PRN

## 2013-09-26 MED ORDER — ASPIRIN EC 325 MG PO TBEC: 325.0000 mg | DELAYED_RELEASE_TABLET | Freq: Every day | ORAL | Status: DC

## 2013-09-26 MED ORDER — ACETAMINOPHEN 325 MG PO TABS
650.0000 mg | ORAL_TABLET | Freq: Four times a day (QID) | ORAL | Status: DC
  Administered 2013-09-26 – 2013-09-27 (×3): 650 mg via ORAL
  Filled 2013-09-26 (×5): qty 2

## 2013-09-26 NOTE — Progress Notes (Addendum)
[facility] PROGRESS NOTE  Holly Grimes ZOX:[id number] DOB: 11/27/1926 DOA: 09/23/2013 PCP: Bufford Spikes, DO  Assessment/Plan: Right Femoral Neck Hip Fracture  -s/p R hip hemiarthroplasty -start lovenox for DVT proph -continue plavix, hold ASA -PT/OT after surgery -CSW consulted -limit narcotics  H/o HTN with orthostatic hypotension  -stable -Follow.   Dementia  -Continue namenda and exelon patch.   DVT Prophylaxis  -SQ heparin for now    Consultants: Dr.Alusio  Procedures: PROCEDURE: Right hip hemiarthroplasty.   HPI/Subjective: Sleepy, wants to be left alone  Objective: Filed Vitals:   09/26/13 0550  BP: 120/74  Pulse: 79  Temp: 98.8 F (37.1 C)  Resp: 20    Intake/Output Summary (Last 24 hours) at 09/26/13 1027 Last data filed at 09/26/13 0916  Gross per 24 hour  Intake    840 ml  Output   1680 ml  Net   -840 ml   Filed Weights   09/25/13 1700  Weight: 80 kg (176 lb 5.9 oz)    Exam:   General:  Sleeping, just back from PACU  Cardiovascular: S!S2/RRR  RespiratoryCTAB  Abdomen: soft, NT, BS present  Musculoskeletal:R leg dressed  Data Reviewed: Basic Metabolic Panel:  Recent Labs Lab 09/23/13 0605 09/23/13 0619 09/24/13 0620 09/25/13 0516 09/26/13 0506  NA  --  144 137 139 135  K  --  3.7 3.8 3.8 3.9  CL  --  104 101 106 101  CO2  --   --  24 26 26   GLUCOSE  --  97 118* 132* 91  BUN  --  16 27* 19 12  CREATININE 0.77 0.90 0.63 0.63 0.73  CALCIUM  --   --  9.0 8.6 8.4   Liver Function Tests: No results found for this basename: AST, ALT, ALKPHOS, BILITOT, PROT, ALBUMIN,  in the last 168 hours No results found for this basename: LIPASE, AMYLASE,  in the last 168 hours No results found for this basename: AMMONIA,  in the last 168 hours CBC:  Recent Labs Lab 09/23/13 0605 09/23/13 0619 09/24/13 0620 09/25/13 0516 09/26/13 0506  WBC 7.1  --  11.1* 14.4* 8.2  NEUTROABS 5.3  --   --   --   --   HGB 13.8 14.6 13.4  10.1* 9.0*  HCT 40.3 43.0 40.0 30.0* 27.4*  MCV 97.6  --  99.0 97.7 100.4*  PLT 179  --  177 142* 145*   Cardiac Enzymes: No results found for this basename: CKTOTAL, CKMB, CKMBINDEX, TROPONINI,  in the last 168 hours BNP (last 3 results) No results found for this basename: PROBNP,  in the last 8760 hours CBG: No results found for this basename: GLUCAP,  in the last 168 hours  Recent Results (from the past 240 hour(s))  SURGICAL PCR SCREEN     Status: Abnormal   Collection Time    09/23/13 10:33 PM      Result Value Range Status   MRSA, PCR POSITIVE (*) NEGATIVE Final   Comment: RESULT CALLED TO, READ BACK BY AND VERIFIED WITH:     JCRUTCHFILED AT 0033 ON [id number] BY DLONG   Staphylococcus aureus POSITIVE (*) NEGATIVE Final   Comment:            The Xpert SA Assay (FDA     approved for NASAL specimens     in patients over 26 years of age),     is one component of     a comprehensive surveillance  program.  Test performance has     been validated by [facility] for patients greater     than or equal to 33 year old.     It is not intended     to diagnose infection nor to     guide or monitor treatment.     Studies: No results found.  Scheduled Meds: . atorvastatin  10 mg Oral q1800  . citalopram  20 mg Oral QHS  . clopidogrel  75 mg Oral Q breakfast  . docusate sodium  100 mg Oral BID  . enoxaparin (LOVENOX) injection  40 mg Subcutaneous Q24H  . feeding supplement (ENSURE COMPLETE)  237 mL Oral Q24H  . feeding supplement (ENSURE)  1 Container Oral Q24H  . Memantine HCl ER  28 mg Oral QHS  . mirabegron ER  25 mg Oral Daily  . multivitamin with minerals  1 tablet Oral Daily  . omega-3 acid ethyl esters  2 g Oral BID  . pantoprazole  40 mg Oral Daily  . rivastigmine  4.6 mg Transdermal Daily   Continuous Infusions: . sodium chloride 20 mL/hr at 09/25/13 1853    Principal Problem:   Hip fracture Active Problems:   Orthostatic hypotension   Hip fracture  requiring operative repair    Time spent:    [facility]  [facility] Pager [phone]. If 7PM-7AM, please contact night-coverage at [email or web address], password [facility] 09/26/2013, 10:27 AM  LOS: 3 days

## 2013-09-26 NOTE — Progress Notes (Signed)
Physical Therapy Treatment Patient Details Name: BECKETT HICKMON MRN: [id number] DOB: 07-02-1927 Today's Date: 09/26/2013 Time: 4098-1191 PT Time Calculation (min): 28 min  PT Assessment / Plan / Recommendation  History of Present Illness     PT Comments   Pt able to ambulate very short distance with RW and +2 assist but continues limited by cognition  Follow Up Recommendations  SNF     Does the patient have the potential to tolerate intense rehabilitation     Barriers to Discharge        Equipment Recommendations  None recommended by PT    Recommendations for Other Services    Frequency Min 3X/week   Progress towards PT Goals Progress towards PT goals: Progressing toward goals  Plan Current plan remains appropriate    Precautions / Restrictions Precautions Precautions: Posterior Hip;Fall Precaution Comments: Pt completely unaware of hemi hip and precautions Restrictions Weight Bearing Restrictions: No RLE Weight Bearing: Weight bearing as tolerated   Pertinent Vitals/Pain Pt c/o R LE discomfort but unable to rate.    Mobility  Bed Mobility Bed Mobility: Sit to Supine Supine to Sit: 1: +2 Total assist Supine to Sit: Patient Percentage: 10% Sit to Supine: 1: +2 Total assist Sit to Supine: Patient Percentage: 0% Details for Bed Mobility Assistance: Pt unable to follow cues to assist with return to bed - "I can't do that" Transfers Transfers: Sit to Stand;Stand to Sit;Stand Pivot Transfers Sit to Stand: 1: +2 Total assist;From chair/3-in-1;With armrests;With upper extremity assist Sit to Stand: Patient Percentage: 30% Stand to Sit: 1: +2 Total assist;To chair/3-in-1;To bed;With upper extremity assist Stand to Sit: Patient Percentage: 20% Stand Pivot Transfers: 1: +2 Total assist Stand Pivot Transfers: Patient Percentage: 40% Details for Transfer Assistance: cues for transition position, use of UEs; physical assist for wt fwd and over LEs and to control descent.  Pt  performed stand/pvt with RW and +2 assist for support/balance and to advance R LE Ambulation/Gait Ambulation/Gait Assistance: 1: +2 Total assist Ambulation/Gait: Patient Percentage: 40% Ambulation Distance (Feet): 3 Feet Assistive device: Rolling walker Ambulation/Gait Assistance Details: cues for posture, position from RW, sequence; physical assist for balance/support and to advance R LE Gait Pattern: Step-to pattern;Decreased step length - right;Decreased step length - left;Shuffle;Trunk flexed    Exercises     PT Diagnosis:    PT Problem List:   PT Treatment Interventions:     PT Goals (current goals can now be found in the care plan section) Acute Rehab PT Goals Patient Stated Goal: family goal of SNF at this time PT Goal Formulation: With patient Time For Goal Achievement: 10/02/13 Potential to Achieve Goals: Good  Visit Information  Last PT Received On: 09/26/13 Assistance Needed: +2    Subjective Data  Subjective: That leg hurts Patient Stated Goal: family goal of SNF at this time   Cognition  Cognition Arousal/Alertness: Awake/alert Behavior During Therapy: WFL for tasks assessed/performed Overall Cognitive Status: History of cognitive impairments - at baseline    Balance     End of Session PT - End of Session Equipment Utilized During Treatment: Gait belt Activity Tolerance: Other (comment) Patient left: in bed;with call bell/phone within reach;with family/visitor present;with nursing/sitter in room Nurse Communication: Mobility status   GP     Keyonia Gluth 09/26/2013, 3:49 PM

## 2013-09-26 NOTE — Progress Notes (Signed)
[facility] PROGRESS NOTE  Holly Grimes ZOX:[id number] DOB: 1927-04-24 DOA: 09/23/2013 PCP: Bufford Spikes, DO  Assessment/Plan: Right Femoral Neck Hip Fracture  -s/p R hip hemiarthroplasty -continue lovenox for DVT proph -continue plavix, hold ASA -PT/OT following -CSW following for rehab -limit narcotics  H/o HTN with orthostatic hypotension  -stable -Follow.   Dementia  -Continue namenda and exelon patch.   DVT Prophylaxis  -SQ lovenox for now  Wean O2 Ambulate Dc foley  COde: Full Code Communication: D/w daughter at bedside Dispo: SNF tomorrow   Consultants: Dr.Alusio  Procedures: PROCEDURE: Right hip hemiarthroplasty.   HPI/Subjective: Smiling, more alert, perky and talkative  Objective: Filed Vitals:   09/26/13 0550  BP: 120/74  Pulse: 79  Temp: 98.8 F (37.1 C)  Resp: 20    Intake/Output Summary (Last 24 hours) at 09/26/13 1105 Last data filed at 09/26/13 0916  Gross per 24 hour  Intake    840 ml  Output   1680 ml  Net   -840 ml   Filed Weights   09/25/13 1700  Weight: 80 kg (176 lb 5.9 oz)    Exam:   General:  Alert, awake, talkative, smiling  Cardiovascular: S!S2/RRR  RespiratoryCTAB  Abdomen: soft, NT, BS present  Musculoskeletal:R leg dressed  Data Reviewed: Basic Metabolic Panel:  Recent Labs Lab 09/23/13 0605 09/23/13 0619 09/24/13 0620 09/25/13 0516 09/26/13 0506  NA  --  144 137 139 135  K  --  3.7 3.8 3.8 3.9  CL  --  104 101 106 101  CO2  --   --  24 26 26   GLUCOSE  --  97 118* 132* 91  BUN  --  16 27* 19 12  CREATININE 0.77 0.90 0.63 0.63 0.73  CALCIUM  --   --  9.0 8.6 8.4   Liver Function Tests: No results found for this basename: AST, ALT, ALKPHOS, BILITOT, PROT, ALBUMIN,  in the last 168 hours No results found for this basename: LIPASE, AMYLASE,  in the last 168 hours No results found for this basename: AMMONIA,  in the last 168 hours CBC:  Recent Labs Lab 09/23/13 0605 09/23/13 0619  09/24/13 0620 09/25/13 0516 09/26/13 0506  WBC 7.1  --  11.1* 14.4* 8.2  NEUTROABS 5.3  --   --   --   --   HGB 13.8 14.6 13.4 10.1* 9.0*  HCT 40.3 43.0 40.0 30.0* 27.4*  MCV 97.6  --  99.0 97.7 100.4*  PLT 179  --  177 142* 145*   Cardiac Enzymes: No results found for this basename: CKTOTAL, CKMB, CKMBINDEX, TROPONINI,  in the last 168 hours BNP (last 3 results) No results found for this basename: PROBNP,  in the last 8760 hours CBG: No results found for this basename: GLUCAP,  in the last 168 hours  Recent Results (from the past 240 hour(s))  SURGICAL PCR SCREEN     Status: Abnormal   Collection Time    09/23/13 10:33 PM      Result Value Range Status   MRSA, PCR POSITIVE (*) NEGATIVE Final   Comment: RESULT CALLED TO, READ BACK BY AND VERIFIED WITH:     JCRUTCHFILED AT 0033 ON [id number] BY DLONG   Staphylococcus aureus POSITIVE (*) NEGATIVE Final   Comment:            The Xpert SA Assay (FDA     approved for NASAL specimens     in patients over 39 years of age),  is one component of     a comprehensive surveillance     program.  Test performance has     been validated by [facility] for patients greater     than or equal to 24 year old.     It is not intended     to diagnose infection nor to     guide or monitor treatment.     Studies: No results found.  Scheduled Meds: . atorvastatin  10 mg Oral q1800  . citalopram  20 mg Oral QHS  . clopidogrel  75 mg Oral Q breakfast  . docusate sodium  100 mg Oral BID  . enoxaparin (LOVENOX) injection  40 mg Subcutaneous Q24H  . feeding supplement (ENSURE COMPLETE)  237 mL Oral Q24H  . feeding supplement (ENSURE)  1 Container Oral Q24H  . Memantine HCl ER  28 mg Oral QHS  . mirabegron ER  25 mg Oral Daily  . multivitamin with minerals  1 tablet Oral Daily  . omega-3 acid ethyl esters  2 g Oral BID  . pantoprazole  40 mg Oral Daily  . rivastigmine  4.6 mg Transdermal Daily   Continuous Infusions: . sodium  chloride 20 mL/hr at 09/25/13 1853    Principal Problem:   Hip fracture Active Problems:   Orthostatic hypotension   Hip fracture requiring operative repair    Time spent:    [facility]  [facility] Pager [phone]. If 7PM-7AM, please contact night-coverage at [email or web address], password [facility] 09/26/2013, 11:05 AM  LOS: 3 days

## 2013-09-26 NOTE — Progress Notes (Signed)
CSW assisting with d/c planning. Pt has a SNF bed at [facility] when stable for D/C. CSW will continue to assist with d/c planning to SNF.  Cori Razor LCSW [phone]

## 2013-09-26 NOTE — Progress Notes (Signed)
Patients daughter expressed concern of pain not well managed with tylenol as ordered PRN.  Daughter states mother takes tylenol at home on regular basis and has good pain control.  Dr Lequita Halt notified and order received updated in [facility].  Daughter notified as well.

## 2013-09-26 NOTE — Progress Notes (Signed)
Subjective: 2 Days Post-Op Procedure(s) (LRB): ARTHROPLASTY BIPOLAR HIP (Right) Patient reports pain as mild.   Patient seen in rounds by Dr. Lequita Halt. Patient has dementia Plan is to go Skilled nursing facility after hospital stay.  Objective: Vital signs in last 24 hours: Temp:  [98.1 F (36.7 C)-98.8 F (37.1 C)] 98.8 F (37.1 C) (12/22 0550) Pulse Rate:  [71-82] 79 (12/22 0550) Resp:  [18-20] 20 (12/22 0550) BP: (119-137)/(74-83) 120/74 mmHg (12/22 0550) SpO2:  [93 %-100 %] 97 % (12/22 0550) Weight:  [80 kg (176 lb 5.9 oz)] 80 kg (176 lb 5.9 oz) (12/21 1700)  Intake/Output from previous day:  Intake/Output Summary (Last 24 hours) at 09/26/13 0725 Last data filed at 09/26/13 1610  Gross per 24 hour  Intake    840 ml  Output   1355 ml  Net   -515 ml    Intake/Output this shift:    Labs:  Recent Labs  09/24/13 0620 09/25/13 0516 09/26/13 0506  HGB 13.4 10.1* 9.0*    Recent Labs  09/25/13 0516 09/26/13 0506  WBC 14.4* 8.2  RBC 3.07* 2.73*  HCT 30.0* 27.4*  PLT 142* 145*    Recent Labs  09/25/13 0516 09/26/13 0506  NA 139 135  K 3.8 3.9  CL 106 101  CO2 26 26  BUN 19 12  CREATININE 0.63 0.73  GLUCOSE 132* 91  CALCIUM 8.6 8.4   No results found for this basename: LABPT, INR,  in the last 72 hours  EXAM General - Patient is Alert and Confused Extremity - Neurovascular intact Sensation intact distally Dressing/Incision - clean, dry Motor Function - intact, moving foot and toes well on exam.   Past Medical History  Diagnosis Date  . Hypertension   . High cholesterol   . Mitral valve prolapse   . Dementia   . Parkinson's disease, Lewy body   . Unspecified constipation   . Onychia and paronychia of toe   . Spasm of muscle   . Abnormality of gait   . Orthostatic hypotension   . Major depressive disorder, single episode, unspecified   . Diaphragmatic hernia without mention of obstruction or gangrene   . Alzheimer's disease   .  Osteoarthrosis, unspecified whether generalized or localized, unspecified site   . Osteoporosis, unspecified   . Unspecified constipation   . Sprain of ribs   . Delirium due to conditions classified elsewhere   . Diarrhea   . Edema   . Pain in joint, pelvic region and thigh   . Other malaise and fatigue   . Urinary frequency [id number]  . Personal history of fall   . Female stress incontinence   . Other and unspecified hyperlipidemia   . Obesity, unspecified   . Osteoarthrosis, unspecified whether generalized or localized, unspecified site   . Pain in joint, shoulder region   . Transient ischemic attack (TIA), and cerebral infarction without residual deficits(V12.54)     Assessment/Plan: 2 Days Post-Op Procedure(s) (LRB): ARTHROPLASTY BIPOLAR HIP (Right) Principal Problem:   Hip fracture Active Problems:   Orthostatic hypotension   Hip fracture requiring operative repair  Estimated body mass index is 29.35 kg/(m^2) as calculated from the following:   Height as of this encounter: 5\' 5"  (1.651 m).   Weight as of this encounter: 80 kg (176 lb 5.9 oz). Up with therapy Discharge to SNF as per Medicine Service  DVT Prophylaxis - Aspirin 325 mg daily, Resume the Plavix Weight Bearing As Tolerated right Leg F/U with  Dr. Lequita Halt in 2 weeks Only using Tylenol for pain control at this time. Will provide Ultram RX if needed.  Cristhian Vanhook 09/26/2013, 7:25 AM

## 2013-09-26 NOTE — Progress Notes (Signed)
Utilization review completed.  

## 2013-09-26 NOTE — Progress Notes (Signed)
OT Cancellation Note  Patient Details Name: Holly Grimes MRN: [id number] DOB: 10/22/1926   Cancelled Treatment:    Noted pt with significant dementia and total A. Will defer OT to SNF  [facility], [place] 09/26/2013, 10:02 AM

## 2013-09-26 NOTE — Progress Notes (Signed)
Physical Therapy Treatment Patient Details Name: Holly Grimes MRN: [id number] DOB: [birth year] Today's Date: 09/26/2013 Time: 1335-1400 PT Time Calculation (min): 25 min  PT Assessment / Plan / Recommendation  History of Present Illness     PT Comments   Pt cooperative and pleasant but ltd by cognition with limited ability to follow through with mobility tasks despite LE discomfort.  Follow Up Recommendations  SNF     Does the patient have the potential to tolerate intense rehabilitation     Barriers to Discharge        Equipment Recommendations  None recommended by PT    Recommendations for Other Services    Frequency Min 3X/week   Progress towards PT Goals Progress towards PT goals: Progressing toward goals  Plan Current plan remains appropriate    Precautions / Restrictions Precautions Precautions: Posterior Hip;Fall Precaution Comments: Pt completely unaware of hemi hip and precautions Restrictions Weight Bearing Restrictions: No RLE Weight Bearing: Weight bearing as tolerated   Pertinent Vitals/Pain Pt reports R LE discomfort but unable rate     Mobility  Bed Mobility Bed Mobility: Supine to Sit Supine to Sit: 1: +2 Total assist Supine to Sit: Patient Percentage: 10% Details for Bed Mobility Assistance: cues for self assist , technique, precautions; +2 for safety, Ub and LB assist Transfers Transfers: Sit to Stand;Stand to Sit;Stand Pivot Transfers Sit to Stand: 1: +2 Total assist Sit to Stand: Patient Percentage: 20% Stand to Sit: 1: +2 Total assist Stand to Sit: Patient Percentage: 20% Stand Pivot Transfers: 1: +2 Total assist Stand Pivot Transfers: Patient Percentage: 40% Details for Transfer Assistance: cues for transition position, use of UEs; physical assist for wt fwd and over LEs and to control descent.  Pt performed stand/pvt with RW and +2 assist for support/balance and to advance R LE Ambulation/Gait Ambulation/Gait Assistance: Not tested  (comment)    Exercises     PT Diagnosis:    PT Problem List:   PT Treatment Interventions:     PT Goals (current goals can now be found in the care plan section) Acute Rehab PT Goals Patient Stated Goal: family goal of SNF at this time PT Goal Formulation: With patient Time For Goal Achievement: 10/02/13 Potential to Achieve Goals: Good  Visit Information  Last PT Received On: 09/26/13 Assistance Needed: +2    Subjective Data  Subjective: That leg hurts Patient Stated Goal: family goal of SNF at this time   Cognition  Cognition Arousal/Alertness: Awake/alert Behavior During Therapy: WFL for tasks assessed/performed Overall Cognitive Status: History of cognitive impairments - at baseline    Balance     End of Session PT - End of Session Equipment Utilized During Treatment: Gait belt Activity Tolerance: Other (comment) (limited motivation to participate) Patient left: in chair;with call bell/phone within reach;with family/visitor present Nurse Communication: Mobility status   GP     Storm Sovine 09/26/2013, 3:42 PM

## 2013-09-27 DIAGNOSIS — F329 Major depressive disorder, single episode, unspecified: Secondary | ICD-10-CM | POA: Diagnosis not present

## 2013-09-27 DIAGNOSIS — E669 Obesity, unspecified: Secondary | ICD-10-CM | POA: Diagnosis not present

## 2013-09-27 DIAGNOSIS — M810 Age-related osteoporosis without current pathological fracture: Secondary | ICD-10-CM | POA: Diagnosis not present

## 2013-09-27 DIAGNOSIS — Z96659 Presence of unspecified artificial knee joint: Secondary | ICD-10-CM | POA: Diagnosis not present

## 2013-09-27 DIAGNOSIS — I951 Orthostatic hypotension: Secondary | ICD-10-CM | POA: Diagnosis not present

## 2013-09-27 DIAGNOSIS — S72009D Fracture of unspecified part of neck of unspecified femur, subsequent encounter for closed fracture with routine healing: Secondary | ICD-10-CM | POA: Diagnosis not present

## 2013-09-27 DIAGNOSIS — J690 Pneumonitis due to inhalation of food and vomit: Secondary | ICD-10-CM | POA: Diagnosis not present

## 2013-09-27 DIAGNOSIS — R059 Cough, unspecified: Secondary | ICD-10-CM | POA: Diagnosis not present

## 2013-09-27 DIAGNOSIS — E785 Hyperlipidemia, unspecified: Secondary | ICD-10-CM | POA: Diagnosis not present

## 2013-09-27 DIAGNOSIS — S72009A Fracture of unspecified part of neck of unspecified femur, initial encounter for closed fracture: Secondary | ICD-10-CM | POA: Diagnosis not present

## 2013-09-27 DIAGNOSIS — F0390 Unspecified dementia without behavioral disturbance: Secondary | ICD-10-CM | POA: Diagnosis not present

## 2013-09-27 DIAGNOSIS — B372 Candidiasis of skin and nail: Secondary | ICD-10-CM | POA: Diagnosis not present

## 2013-09-27 DIAGNOSIS — M1990 Unspecified osteoarthritis, unspecified site: Secondary | ICD-10-CM | POA: Diagnosis not present

## 2013-09-27 DIAGNOSIS — R350 Frequency of micturition: Secondary | ICD-10-CM | POA: Diagnosis not present

## 2013-09-27 DIAGNOSIS — K219 Gastro-esophageal reflux disease without esophagitis: Secondary | ICD-10-CM | POA: Diagnosis not present

## 2013-09-27 DIAGNOSIS — Z4889 Encounter for other specified surgical aftercare: Secondary | ICD-10-CM | POA: Diagnosis not present

## 2013-09-27 DIAGNOSIS — Z9181 History of falling: Secondary | ICD-10-CM | POA: Diagnosis not present

## 2013-09-27 DIAGNOSIS — J40 Bronchitis, not specified as acute or chronic: Secondary | ICD-10-CM | POA: Diagnosis not present

## 2013-09-27 DIAGNOSIS — J42 Unspecified chronic bronchitis: Secondary | ICD-10-CM | POA: Diagnosis not present

## 2013-09-27 DIAGNOSIS — M25559 Pain in unspecified hip: Secondary | ICD-10-CM | POA: Diagnosis not present

## 2013-09-27 DIAGNOSIS — S72023B Displaced fracture of epiphysis (separation) (upper) of unspecified femur, initial encounter for open fracture type I or II: Secondary | ICD-10-CM | POA: Diagnosis not present

## 2013-09-27 DIAGNOSIS — R32 Unspecified urinary incontinence: Secondary | ICD-10-CM | POA: Diagnosis not present

## 2013-09-27 DIAGNOSIS — M6281 Muscle weakness (generalized): Secondary | ICD-10-CM | POA: Diagnosis not present

## 2013-09-27 DIAGNOSIS — Z471 Aftercare following joint replacement surgery: Secondary | ICD-10-CM | POA: Diagnosis not present

## 2013-09-27 DIAGNOSIS — R269 Unspecified abnormalities of gait and mobility: Secondary | ICD-10-CM | POA: Diagnosis not present

## 2013-09-27 DIAGNOSIS — Z8673 Personal history of transient ischemic attack (TIA), and cerebral infarction without residual deficits: Secondary | ICD-10-CM | POA: Diagnosis not present

## 2013-09-27 LAB — CBC
Hemoglobin: 9.3 g/dL — ABNORMAL LOW (ref 12.0–15.0)
MCH: 33.7 pg (ref 26.0–34.0)
MCHC: 33.7 g/dL (ref 30.0–36.0)
MCV: 100 fL (ref 78.0–100.0)
Platelets: 148 10*3/uL — ABNORMAL LOW (ref 150–400)
RBC: 2.76 MIL/uL — ABNORMAL LOW (ref 3.87–5.11)
RDW: 12.6 % (ref 11.5–15.5)
WBC: 6.7 10*3/uL (ref 4.0–10.5)

## 2013-09-27 MED ORDER — HYDROCODONE-ACETAMINOPHEN 5-500 MG PO TABS: 1.0000 | ORAL_TABLET | Freq: Four times a day (QID) | ORAL | Status: DC | PRN

## 2013-09-27 MED ORDER — ENOXAPARIN SODIUM 40 MG/0.4ML ~~LOC~~ SOLN: 40.0000 mg | SUBCUTANEOUS | Status: DC

## 2013-09-27 MED ORDER — ALIGN 4 MG PO CAPS: 1.0000 | ORAL_CAPSULE | Freq: Every day | ORAL | Status: DC

## 2013-09-27 NOTE — Progress Notes (Signed)
Subjective: 3 Days Post-Op Procedure(s) (LRB): ARTHROPLASTY BIPOLAR HIP (Right) Patient reports pain as mild.   Patient seen in rounds with Dr. Lequita Halt.  Not much sleep.  Hip feeling better.  Family in room. Patient is well, but has had some minor complaints of pain in the hip, requiring pain medications Plan is to go Skilled nursing facility after hospital stay once felt stable from Medicine standpoint.  Objective: Vital signs in last 24 hours: Temp:  [99.3 F (37.4 C)-99.9 F (37.7 C)] 99.3 F (37.4 C) (12/23 0700) Pulse Rate:  [84-97] 84 (12/23 0700) Resp:  [18] 18 (12/23 0700) BP: (92-140)/(61-86) 140/86 mmHg (12/23 0700) SpO2:  [94 %-98 %] 98 % (12/23 0700)  Intake/Output from previous day:  Intake/Output Summary (Last 24 hours) at 09/27/13 0741 Last data filed at 09/27/13 0259  Gross per 24 hour  Intake 892.34 ml  Output    625 ml  Net 267.34 ml    Intake/Output this shift:    Labs:  Recent Labs  09/25/13 0516 09/26/13 0506 09/27/13 0506  HGB 10.1* 9.0* 9.3*    Recent Labs  09/26/13 0506 09/27/13 0506  WBC 8.2 6.7  RBC 2.73* 2.76*  HCT 27.4* 27.6*  PLT 145* 148*    Recent Labs  09/25/13 0516 09/26/13 0506  NA 139 135  K 3.8 3.9  CL 106 101  CO2 26 26  BUN 19 12  CREATININE 0.63 0.73  GLUCOSE 132* 91  CALCIUM 8.6 8.4   No results found for this basename: LABPT, INR,  in the last 72 hours  EXAM General - Patient is Alert Extremity - Neurovascular intact Sensation intact distally Dressing/Incision - clean, dry, no drainage Motor Function - intact, moving foot and toes well on exam.   Past Medical History  Diagnosis Date  . Hypertension   . High cholesterol   . Mitral valve prolapse   . Dementia   . Parkinson's disease, Lewy body   . Unspecified constipation   . Onychia and paronychia of toe   . Spasm of muscle   . Abnormality of gait   . Orthostatic hypotension   . Major depressive disorder, single episode, unspecified   .  Diaphragmatic hernia without mention of obstruction or gangrene   . Alzheimer's disease   . Osteoarthrosis, unspecified whether generalized or localized, unspecified site   . Osteoporosis, unspecified   . Unspecified constipation   . Sprain of ribs   . Delirium due to conditions classified elsewhere   . Diarrhea   . Edema   . Pain in joint, pelvic region and thigh   . Other malaise and fatigue   . Urinary frequency [id number]  . Personal history of fall   . Female stress incontinence   . Other and unspecified hyperlipidemia   . Obesity, unspecified   . Osteoarthrosis, unspecified whether generalized or localized, unspecified site   . Pain in joint, shoulder region   . Transient ischemic attack (TIA), and cerebral infarction without residual deficits(V12.54)     Assessment/Plan: 3 Days Post-Op Procedure(s) (LRB): ARTHROPLASTY BIPOLAR HIP (Right) Principal Problem:   Hip fracture Active Problems:   Orthostatic hypotension   Hip fracture requiring operative repair   Dementia  Estimated body mass index is 29.35 kg/(m^2) as calculated from the following:   Height as of this encounter: 5\' 5"  (1.651 m).   Weight as of this encounter: 80 kg (176 lb 5.9 oz). Up with therapy Discharge to SNF as per Medicine  DVT Prophylaxis - Aspirin  325 mg daily, Resume the Plavix Weight Bearing As Tolerated right Leg  F/U with Dr. Lequita Halt in 2 weeks  Only using Tylenol for pain control at this time.  Will provide Ultram RX if needed.  Patrica Duel 09/27/2013, 7:41 AM

## 2013-09-27 NOTE — Discharge Summary (Addendum)
Physician Discharge Summary  Holly Grimes ZOX:[id number] DOB: 31-Jul-1927 DOA: 09/23/2013  PCP: Bufford Spikes, DO  Admit date: 09/23/2013 Discharge date: 09/27/2013  Time spent: 50 minutes  Recommendations for Outpatient Follow-up:  1. Fu with Dr.Alusio in 2 weeks 2. Stop Lovenox in 3-4 weeks 3. CBC weekly to monitor platelet count while on lovenox 4. Restart ASA daily once Lovenox discontinued  5.   Try to avoid using narcotics for pain unless absolutely needed  Weight Bearing As Tolerated Right Leg   Discharge Diagnoses:  Principal Problem:   Hip fracture Active Problems:   Orthostatic hypotension   Hip fracture requiring operative repair   Dementia   Discharge Condition:stable  Diet recommendation: heart healthy  Filed Weights   09/25/13 1700  Weight: 80 kg (176 lb 5.9 oz)    History of present illness:  Chief Complaint:  Fall with right hip pain  HPI:  79 y/o woman with h/o dementia. Lives at home with daughter Holly Batten. Daughter states she woke up at 3:30 am to her mother asking for help. She had gotten out of bed and managed to get to the front door where she fell. No LOC reported. She was unable to stand and daughter called EMS. In the ED, Xrays confirm a right femoral neck fracture. We have been asked to admit her for further evaluation and management. Has already been seen by ortho, with plans for surgery in the am.   Hospital Course:  Right Femoral Neck Hip Fracture  -s/p R hip hemiarthroplasty  -started on  lovenox for DVT proph  -continue plavix, hold ASA while on lovenox -PT/OT following  -CSW following for rehab  -limit narcotics, use tylenol only for pain if possible  H/o HTN with orthostatic hypotension  -stable   Dementia  -Continue namenda and exelon patch.   DVT Prophylaxis  -SQ lovenox for 3 weeks  Consultations: Dr.Alusio  PROCEDURE: Right hip hemiarthroplasty 12/20   Discharge Exam: Filed Vitals:   09/27/13 0700  BP: 140/86  Pulse:  84  Temp: 99.3 F (37.4 C)  Resp: 18    General: Alert, awake, oriented to self, place Cardiovascular:S1S2/RRR Respiratory: CTAB  Discharge Instructions      Discharge Orders   Future Appointments Provider Department Dept Phone   01/19/2014 2:30 PM Kermit Balo [facility] [phone]   Future Orders Complete By Expires   Call MD / Call 911  As directed    Comments:     If you experience chest pain or shortness of breath, CALL 911 and be transported to the hospital emergency room.  If you develope a fever above 101 F, pus (white drainage) or increased drainage or redness at the wound, or calf pain, call your surgeon's office.   Change dressing  As directed    Comments:     You may change your dressing dressing daily with sterile 4 x 4 inch gauze dressing and paper tape.  Do not submerge the incision under water.   Constipation Prevention  As directed    Comments:     Drink plenty of fluids.  Prune juice may be helpful.  You may use a stool softener, such as Colace (over the counter) 100 mg twice a day.  Use MiraLax (over the counter) for constipation as needed.   Diet - low sodium heart healthy  As directed    Diet - low sodium heart healthy  As directed    Discharge instructions  As directed    Comments:  Pick up stool softner and laxative for home. Do not submerge incision under water. May shower. Continue to use ice for pain and swelling from surgery. Hip precautions.  Total Hip Protocol.  Full dose Aspirin 325 mg daily for four weeks Patient to resume her Plavix also at discharge.  When discharged from the skilled rehab facility, please have the facility set up the patient's Home Health Physical Therapy prior to being released.  Also provide the patient with their medications at time of release from the facility to include their pain medication, the muscle relaxants, and their blood thinner medication.  If the patient is still at the rehab facility at  time of follow up appointment, please also assist the patient in arranging follow up appointment in our office and any transportation needs.   Do not sit on low chairs, stoools or toilet seats, as it may be difficult to get up from low surfaces  As directed    Driving restrictions  As directed    Comments:     No driving until released by the physician.   Follow the hip precautions as taught in Physical Therapy  As directed    Full weight bearing  As directed    Questions:     Laterality:     Extremity:     Increase activity slowly as tolerated  As directed    Increase activity slowly  As directed    Lifting restrictions  As directed    Comments:     No lifting until released by the physician.   Patient may shower  As directed    Comments:     You may shower without a dressing once there is no drainage.  Do not wash over the wound.  If drainage remains, do not shower until drainage stops.   TED hose  As directed    Comments:     Use stockings (TED hose) for 3 weeks on both leg(s).  You may remove them at night for sleeping.   Weight bearing as tolerated  As directed    Questions:     Laterality:     Extremity:         Medication List    STOP taking these medications       acetaminophen 650 MG CR tablet  Commonly known as:  TYLENOL  Replaced by:  acetaminophen 325 MG tablet      TAKE these medications       acetaminophen 325 MG tablet  Commonly known as:  TYLENOL  Take 2 tablets (650 mg total) by mouth every 6 (six) hours as needed for mild pain or fever.     ALIGN 4 MG Caps  Take 1 capsule (4 mg total) by mouth daily.     CENTRUM SILVER ULTRA WOMENS PO  Take 1 tablet by mouth every morning.     citalopram 20 MG tablet  Commonly known as:  CELEXA  Take 20 mg by mouth at bedtime.     clopidogrel 75 MG tablet  Commonly known as:  PLAVIX  Take 75 mg by mouth every morning.     docusate sodium 100 MG capsule  Commonly known as:  COLACE  Take 100 mg by mouth daily  as needed.     enoxaparin 40 MG/0.4ML injection  Commonly known as:  LOVENOX  Inject 0.4 mLs (40 mg total) into the skin daily.     fluticasone 0.05 % cream  Commonly known as:  CUTIVATE  Apply 1 application  topically 2 (two) times daily. To affected areas     haloperidol 0.5 MG tablet  Commonly known as:  HALDOL  Take 0.5 mg by mouth every 6 (six) hours as needed for agitation.     HYDROcodone-acetaminophen 5-500 MG per tablet  Commonly known as:  VICODIN  Take 1 tablet by mouth every 6 (six) hours as needed (for severe pain only, to be used as last resort if Tylenol and rest doesnt help).     MYRBETRIQ 25 MG Tb24 tablet  Generic drug:  mirabegron ER  Take 25 mg by mouth daily.     NAMENDA XR 28 MG Cp24  Generic drug:  Memantine HCl ER  Take 28 mg by mouth at bedtime. Take one tablet once a day to preserve memory     omega-3 acid ethyl esters 1 G capsule  Commonly known as:  LOVAZA  Take 2 g by mouth 2 (two) times daily.     omeprazole 20 MG capsule  Commonly known as:  PRILOSEC  Take 20 mg by mouth daily.     rivastigmine 4.6 mg/24hr  Commonly known as:  EXELON  Place 1 patch onto the skin daily.     rosuvastatin 10 MG tablet  Commonly known as:  CRESTOR  Take 10 mg by mouth at bedtime.     vitamin C with rose hips 1000 MG tablet  Take 1,000 mg by mouth daily.     Vitamin D3 2000 UNITS Tabs  Take 2,000 Units by mouth daily.       Allergies  Allergen Reactions  . Cephalexin Nausea And Vomiting  . Sulfa Antibiotics Other (See Comments)    Daughter unsure of reaction, but believes it is severe  . Sulfites    Follow-up Information   Follow up with Loanne Drilling, MD In 2 weeks. (Please have SNF assist in making follow up and transportation for patient.)    Specialty:  Orthopedic Surgery   Contact information:   [address] [place] [address] [phone]        The results of significant diagnostics from this hospitalization  (including imaging, microbiology, ancillary and laboratory) are listed below for reference.    Significant Diagnostic Studies: Dg Chest 1 View  09/23/2013   CLINICAL DATA:  Preop for hip fracture  EXAM: CHEST - 1 VIEW  COMPARISON:  04/23/2012  FINDINGS: Mild cardiomegaly. Previously seen hiatal hernia not seen currently. No acute infiltrate or edema. No effusion or pneumothorax. There are granulomatous changes to the lower lungs. Glenohumeral osteoarthritis.  IMPRESSION: No evidence of acute cardiopulmonary disease.   Electronically Signed   By: Tiburcio Pea M.D.   On: 09/23/2013 06:03   Dg Hip Complete Right  09/23/2013   CLINICAL DATA:  Fall with right hip pain  EXAM: RIGHT HIP - COMPLETE 2+ VIEW  COMPARISON:  08/22/2011  FINDINGS: Subcapital right femoral neck fracture with impaction of the upper margin. The femoral head remains located. No evidence of pelvic ring fracture or diastasis. No significant hip joint narrowing. Lower lumbar degenerative disc and facet disease.  IMPRESSION: Acute, subcapital right femoral neck fracture.   Electronically Signed   By: Tiburcio Pea M.D.   On: 09/23/2013 06:01   Dg Pelvis Portable  09/24/2013   CLINICAL DATA:  Status post right bipolar hip prosthesis placement.  EXAM: PORTABLE PELVIS 1-2 VIEWS  COMPARISON:  Right hip series dated December 19th.  2014.  FINDINGS: The patient has undergone right hip joint prosthesis placement. Radiographic  positioning of the prosthetic components is good. A surgical drain line is present. The native bone and its interface with the femoral component of the prosthesis appears normal.  IMPRESSION: The patient has undergone right hip joint prosthesis placement. Radiographic positioning appears good.   Electronically Signed   By: David  [place]   On: 09/24/2013 09:47    Microbiology: Recent Results (from the past 240 hour(s))  SURGICAL PCR SCREEN     Status: Abnormal   Collection Time    09/23/13 10:33 PM      Result  Value Range Status   MRSA, PCR POSITIVE (*) NEGATIVE Final   Comment: RESULT CALLED TO, READ BACK BY AND VERIFIED WITH:     JCRUTCHFILED AT 0033 ON [id number] BY DLONG   Staphylococcus aureus POSITIVE (*) NEGATIVE Final   Comment:            The Xpert SA Assay (FDA     approved for NASAL specimens     in patients over 31 years of age),     is one component of     a comprehensive surveillance     program.  Test performance has     been validated by [employer] for patients greater     than or equal to 40 year old.     It is not intended     to diagnose infection nor to     guide or monitor treatment.     Labs: Basic Metabolic Panel:  Recent Labs Lab 09/23/13 0605 09/23/13 0619 09/24/13 0620 09/25/13 0516 09/26/13 0506  NA  --  144 137 139 135  K  --  3.7 3.8 3.8 3.9  CL  --  104 101 106 101  CO2  --   --  24 26 26   GLUCOSE  --  97 118* 132* 91  BUN  --  16 27* 19 12  CREATININE 0.77 0.90 0.63 0.63 0.73  CALCIUM  --   --  9.0 8.6 8.4   Liver Function Tests: No results found for this basename: AST, ALT, ALKPHOS, BILITOT, PROT, ALBUMIN,  in the last 168 hours No results found for this basename: LIPASE, AMYLASE,  in the last 168 hours No results found for this basename: AMMONIA,  in the last 168 hours CBC:  Recent Labs Lab 09/23/13 0605 09/23/13 0619 09/24/13 0620 09/25/13 0516 09/26/13 0506 09/27/13 0506  WBC 7.1  --  11.1* 14.4* 8.2 6.7  NEUTROABS 5.3  --   --   --   --   --   HGB 13.8 14.6 13.4 10.1* 9.0* 9.3*  HCT 40.3 43.0 40.0 30.0* 27.4* 27.6*  MCV 97.6  --  99.0 97.7 100.4* 100.0  PLT 179  --  177 142* 145* 148*   Cardiac Enzymes: No results found for this basename: CKTOTAL, CKMB, CKMBINDEX, TROPONINI,  in the last 168 hours BNP: BNP (last 3 results) No results found for this basename: PROBNP,  in the last 8760 hours CBG: No results found for this basename: GLUCAP,  in the last 168 hours     Signed:  Ellese Julius  [facility] 09/27/2013, 12:05 PM

## 2013-09-27 NOTE — Progress Notes (Signed)
Clinical Social Work Department CLINICAL SOCIAL WORK PLACEMENT NOTE 09/27/2013  Patient:  Holly Grimes, Holly Grimes  Account Number:  [id number] Admit date:  09/23/2013  Clinical Social Worker:  Cori Razor, LCSW  Date/time:  09/23/2013 12:47 PM  Clinical Social Work is seeking post-discharge placement for this patient at the following level of care:   SKILLED NURSING   (*CSW will update this form in Epic as items are completed)     Patient/family provided with [facility] Department of Clinical Social Work's list of facilities offering this level of care within the geographic area requested by the patient (or if unable, by the patient's family).  09/23/2013  Patient/family informed of their freedom to choose among providers that offer the needed level of care, that participate in Medicare, Medicaid or managed care program needed by the patient, have an available bed and are willing to accept the patient.    Patient/family informed of [facility]' ownership interest in [facility], as well as of the fact that they are under no obligation to receive care at this facility.  PASARR submitted to EDS on 09/23/2013 PASARR number received from EDS on 09/23/2013  FL2 transmitted to all facilities in geographic area requested by pt/family on  09/23/2013 FL2 transmitted to all facilities within larger geographic area on   Patient informed that his/her managed care company has contracts with or will negotiate with  certain facilities, including the following:     Patient/family informed of bed offers received:  09/26/2013 Patient chooses bed at [facility] Physician recommends and patient chooses bed at    Patient to be transferred to [facility] on  09/27/2013 Patient to be transferred to facility by [facility]  The following physician request were entered in Epic:   Additional Comments:  Cori Razor LCSW [phone]

## 2013-09-27 NOTE — Care Management Note (Signed)
    Page 1 of 1   09/27/2013     6:11:35 PM   CARE MANAGEMENT NOTE 09/27/2013  Patient:  Holly Grimes, Holly Grimes   Account Number:  [id number]  Date Initiated:  09/25/2013  Documentation initiated by:  [facility]  Subjective/Objective Assessment:   s/p right hip hemiarthroplasty     Action/Plan:   SNF recommended   Anticipated DC Date:  09/27/2013   Anticipated DC Plan:  SKILLED NURSING FACILITY      DC Planning Services  CM consult      Choice offered to / List presented to:             Status of service:  Completed, signed off Medicare Important Message given?   (If response is "NO", the following Medicare IM given date fields will be blank) Date Medicare IM given:   Date Additional Medicare IM given:    Discharge Disposition:  SKILLED NURSING FACILITY  Per UR Regulation:    If discussed at Long Length of Stay Meetings, dates discussed:    Comments:

## 2013-09-30 ENCOUNTER — Non-Acute Institutional Stay (SKILLED_NURSING_FACILITY): Payer: Medicare Other | Admitting: Adult Health

## 2013-09-30 DIAGNOSIS — M25559 Pain in unspecified hip: Secondary | ICD-10-CM

## 2013-09-30 DIAGNOSIS — K219 Gastro-esophageal reflux disease without esophagitis: Secondary | ICD-10-CM

## 2013-09-30 DIAGNOSIS — S72009D Fracture of unspecified part of neck of unspecified femur, subsequent encounter for closed fracture with routine healing: Secondary | ICD-10-CM | POA: Diagnosis not present

## 2013-09-30 DIAGNOSIS — M25551 Pain in right hip: Secondary | ICD-10-CM

## 2013-09-30 DIAGNOSIS — F32A Depression, unspecified: Secondary | ICD-10-CM

## 2013-09-30 DIAGNOSIS — F329 Major depressive disorder, single episode, unspecified: Secondary | ICD-10-CM

## 2013-09-30 DIAGNOSIS — F0390 Unspecified dementia without behavioral disturbance: Secondary | ICD-10-CM | POA: Diagnosis not present

## 2013-09-30 DIAGNOSIS — K5900 Constipation, unspecified: Secondary | ICD-10-CM

## 2013-09-30 DIAGNOSIS — S72001D Fracture of unspecified part of neck of right femur, subsequent encounter for closed fracture with routine healing: Secondary | ICD-10-CM

## 2013-09-30 DIAGNOSIS — R350 Frequency of micturition: Secondary | ICD-10-CM | POA: Diagnosis not present

## 2013-09-30 DIAGNOSIS — E785 Hyperlipidemia, unspecified: Secondary | ICD-10-CM

## 2013-09-30 DIAGNOSIS — F3289 Other specified depressive episodes: Secondary | ICD-10-CM

## 2013-10-02 DIAGNOSIS — E785 Hyperlipidemia, unspecified: Secondary | ICD-10-CM | POA: Insufficient documentation

## 2013-10-02 DIAGNOSIS — F329 Major depressive disorder, single episode, unspecified: Secondary | ICD-10-CM | POA: Insufficient documentation

## 2013-10-02 DIAGNOSIS — M25551 Pain in right hip: Secondary | ICD-10-CM | POA: Insufficient documentation

## 2013-10-02 DIAGNOSIS — K5900 Constipation, unspecified: Secondary | ICD-10-CM | POA: Insufficient documentation

## 2013-10-02 DIAGNOSIS — K219 Gastro-esophageal reflux disease without esophagitis: Secondary | ICD-10-CM | POA: Insufficient documentation

## 2013-10-02 NOTE — Progress Notes (Signed)
Patient ID: Holly Grimes, female   DOB: [birth year], 79 y.o.   MRN: [id number]                        PROGRESS NOTE  DATE: 09/30/2013  FACILITY: Nursing Home Location: [facility]  LEVEL OF CARE: SNF (31)  Acute Visit  CHIEF COMPLAINT:  Follow-up Hospitalization  HISTORY OF PRESENT ILLNESS: This is an 79 year old female who has been admitted to [facility] on 09/27/13 from [facility]. She fell at home and sustained a right femoral neck/hip fracture and had a right hip hemiarthroplasty. She has been admitted for a short-term rehabilitation.  REASSESSMENT OF ONGOING PROBLEM(S):  DEMENTIA: The dementia remaines stable and continues to function adequately in the current living environment with supervision.  The patient has had little changes in behavior. No complications noted from the medications presently being used.  DEPRESSION: The depression remains stable. Patient denies ongoing feelings of sadness, insomnia, anedhonia or lack of appetite. No complications reported from the medications currently being used. Staff do not report behavioral problems.  CONSTIPATION: The constipation remains stable. No complications from the medications presently being used. Patient denies ongoing constipation, abdominal pain, nausea or vomiting.   PAST MEDICAL HISTORY : Reviewed.  No changes.  CURRENT MEDICATIONS: Reviewed per [facility]  REVIEW OF SYSTEMS:  GENERAL: no change in appetite, no fatigue, no weight changes, no fever, chills or weakness RESPIRATORY: no cough, SOB, DOE, wheezing, hemoptysis CARDIAC: no chest pain, edema or palpitations GI: no abdominal pain, diarrhea, constipation, heart burn, nausea or vomiting  PHYSICAL EXAMINATION  VS:  T 96       P 84      RR 16      BP 120/71     POX 96 %       GENERAL: no acute distress, normal body habitus EYES: conjunctivae normal, sclerae normal, normal eye lids NECK: supple, trachea midline, no neck masses, no thyroid  tenderness, no thyromegaly LYMPHATICS: no LAN in the neck, no supraclavicular LAN RESPIRATORY: breathing is even & unlabored, BS CTAB CARDIAC: RRR, no murmur,no extra heart sounds, no edema GI: abdomen soft, normal BS, no masses, no tenderness, no hepatomegaly, no splenomegaly PSYCHIATRIC: the patient is alert & oriented to person, affect & behavior appropriate  LABS/RADIOLOGY: 09/27/13 WBC 6.7 hemoglobin 9.3 hematocrit 27.6 09/26/13 sodium 135 potassium 3.9 glucose 91 BUN 12 creatinine 0.7 and 30 calcium 8.4   ASSESSMENT/PLAN:  Right hip fracture status post right hip hemiarthroplasty - for rehabilitation  Urinary frequency - continue Myrbetriq  GERD - continue Prilosec  Dementia - continue Exelon patch and Namenda  Constipation - continue Colace  Hyperlipidemia - continue Crestor and Lovaza  Depression - continue Celexa  Right hip pain - start Tylenol 650 mg by mouth Q8 hours    CPT CODE: 82956

## 2013-10-04 ENCOUNTER — Non-Acute Institutional Stay (SKILLED_NURSING_FACILITY): Payer: Medicare Other | Admitting: Adult Health

## 2013-10-04 DIAGNOSIS — B372 Candidiasis of skin and nail: Secondary | ICD-10-CM

## 2013-10-04 DIAGNOSIS — R350 Frequency of micturition: Secondary | ICD-10-CM | POA: Diagnosis not present

## 2013-10-06 DIAGNOSIS — Z96659 Presence of unspecified artificial knee joint: Secondary | ICD-10-CM | POA: Diagnosis not present

## 2013-10-06 DIAGNOSIS — J42 Unspecified chronic bronchitis: Secondary | ICD-10-CM | POA: Diagnosis not present

## 2013-10-06 DIAGNOSIS — S72009A Fracture of unspecified part of neck of unspecified femur, initial encounter for closed fracture: Secondary | ICD-10-CM | POA: Diagnosis not present

## 2013-10-06 DIAGNOSIS — M810 Age-related osteoporosis without current pathological fracture: Secondary | ICD-10-CM | POA: Diagnosis not present

## 2013-10-06 DIAGNOSIS — R059 Cough, unspecified: Secondary | ICD-10-CM | POA: Diagnosis not present

## 2013-10-06 DIAGNOSIS — S72023B Displaced fracture of epiphysis (separation) (upper) of unspecified femur, initial encounter for open fracture type I or II: Secondary | ICD-10-CM | POA: Diagnosis not present

## 2013-10-06 DIAGNOSIS — Z9181 History of falling: Secondary | ICD-10-CM | POA: Diagnosis not present

## 2013-10-06 DIAGNOSIS — R269 Unspecified abnormalities of gait and mobility: Secondary | ICD-10-CM | POA: Diagnosis not present

## 2013-10-06 DIAGNOSIS — Z4889 Encounter for other specified surgical aftercare: Secondary | ICD-10-CM | POA: Diagnosis not present

## 2013-10-06 DIAGNOSIS — E785 Hyperlipidemia, unspecified: Secondary | ICD-10-CM | POA: Diagnosis not present

## 2013-10-06 DIAGNOSIS — S72009D Fracture of unspecified part of neck of unspecified femur, subsequent encounter for closed fracture with routine healing: Secondary | ICD-10-CM | POA: Diagnosis not present

## 2013-10-06 DIAGNOSIS — Z8673 Personal history of transient ischemic attack (TIA), and cerebral infarction without residual deficits: Secondary | ICD-10-CM | POA: Diagnosis not present

## 2013-10-06 DIAGNOSIS — J690 Pneumonitis due to inhalation of food and vomit: Secondary | ICD-10-CM | POA: Diagnosis not present

## 2013-10-06 DIAGNOSIS — J40 Bronchitis, not specified as acute or chronic: Secondary | ICD-10-CM | POA: Diagnosis not present

## 2013-10-06 DIAGNOSIS — F3289 Other specified depressive episodes: Secondary | ICD-10-CM | POA: Diagnosis not present

## 2013-10-06 DIAGNOSIS — I951 Orthostatic hypotension: Secondary | ICD-10-CM | POA: Diagnosis not present

## 2013-10-06 DIAGNOSIS — F0390 Unspecified dementia without behavioral disturbance: Secondary | ICD-10-CM | POA: Diagnosis not present

## 2013-10-06 DIAGNOSIS — K219 Gastro-esophageal reflux disease without esophagitis: Secondary | ICD-10-CM | POA: Diagnosis not present

## 2013-10-06 DIAGNOSIS — M1990 Unspecified osteoarthritis, unspecified site: Secondary | ICD-10-CM | POA: Diagnosis not present

## 2013-10-06 DIAGNOSIS — E669 Obesity, unspecified: Secondary | ICD-10-CM | POA: Diagnosis not present

## 2013-10-06 DIAGNOSIS — R350 Frequency of micturition: Secondary | ICD-10-CM | POA: Diagnosis not present

## 2013-10-06 DIAGNOSIS — R32 Unspecified urinary incontinence: Secondary | ICD-10-CM | POA: Diagnosis not present

## 2013-10-06 DIAGNOSIS — M6281 Muscle weakness (generalized): Secondary | ICD-10-CM | POA: Diagnosis not present

## 2013-10-14 ENCOUNTER — Non-Acute Institutional Stay (SKILLED_NURSING_FACILITY): Payer: Medicare Other | Admitting: Adult Health

## 2013-10-14 DIAGNOSIS — R05 Cough: Secondary | ICD-10-CM | POA: Diagnosis not present

## 2013-10-14 DIAGNOSIS — J40 Bronchitis, not specified as acute or chronic: Secondary | ICD-10-CM

## 2013-10-28 DIAGNOSIS — S72023B Displaced fracture of epiphysis (separation) (upper) of unspecified femur, initial encounter for open fracture type I or II: Secondary | ICD-10-CM | POA: Diagnosis not present

## 2013-10-28 DIAGNOSIS — Z96659 Presence of unspecified artificial knee joint: Secondary | ICD-10-CM | POA: Diagnosis not present

## 2013-11-03 DIAGNOSIS — J42 Unspecified chronic bronchitis: Secondary | ICD-10-CM | POA: Diagnosis not present

## 2013-11-04 ENCOUNTER — Non-Acute Institutional Stay (SKILLED_NURSING_FACILITY): Payer: Medicare Other | Admitting: Adult Health

## 2013-11-04 DIAGNOSIS — E785 Hyperlipidemia, unspecified: Secondary | ICD-10-CM

## 2013-11-04 DIAGNOSIS — F32A Depression, unspecified: Secondary | ICD-10-CM

## 2013-11-04 DIAGNOSIS — F0390 Unspecified dementia without behavioral disturbance: Secondary | ICD-10-CM

## 2013-11-04 DIAGNOSIS — R350 Frequency of micturition: Secondary | ICD-10-CM | POA: Diagnosis not present

## 2013-11-04 DIAGNOSIS — K5900 Constipation, unspecified: Secondary | ICD-10-CM

## 2013-11-04 DIAGNOSIS — K219 Gastro-esophageal reflux disease without esophagitis: Secondary | ICD-10-CM | POA: Diagnosis not present

## 2013-11-04 DIAGNOSIS — F3289 Other specified depressive episodes: Secondary | ICD-10-CM

## 2013-11-04 DIAGNOSIS — F329 Major depressive disorder, single episode, unspecified: Secondary | ICD-10-CM

## 2013-11-04 DIAGNOSIS — S72009A Fracture of unspecified part of neck of unspecified femur, initial encounter for closed fracture: Secondary | ICD-10-CM | POA: Diagnosis not present

## 2013-11-05 DIAGNOSIS — J189 Pneumonia, unspecified organism: Secondary | ICD-10-CM | POA: Insufficient documentation

## 2013-11-05 DIAGNOSIS — B372 Candidiasis of skin and nail: Secondary | ICD-10-CM | POA: Insufficient documentation

## 2013-11-05 NOTE — Progress Notes (Signed)
Patient ID: Holly Grimes, female   DOB: 1927/08/08, 80 y.o.   MRN: [id number]                         PROGRESS NOTE  DATE: 11/04/13  FACILITY: Nursing Home [facility]: [facility]  LEVEL OF CARE: SNF (31)  Acute Visit  CHIEF COMPLAINT:  Discharge Notes  HISTORY OF PRESENT ILLNESS: This is an 80 year old female who is for discharge home with Home health PT, OT and Nursing. DME: Rolling walker . [facility]. She fell at home and sustained a right femoral neck/hip fracture and had a right hip hemiarthroplasty. Patient was admitted to this facility for short-term rehabilitation after the patient's recent hospitalization.  Patient has completed SNF rehabilitation and therapy has cleared the patient for discharge.   REASSESSMENT OF ONGOING PROBLEM(S):  GERD: pt's GERD is stable.  Denies ongoing heartburn, abd. Pain, nausea or vomiting.  Currently on a PPI & tolerates it without any adverse reactions.  HYPERLIPIDEMIA: No complications from the medications presently being used.   DEMENTIA: The dementia remaines stable and continues to function adequately in the current living environment with supervision.  The patient has had little changes in behavior. No complications noted from the medications presently being used.   PAST MEDICAL HISTORY : Reviewed.  No changes.  CURRENT MEDICATIONS: [facility]  REVIEW OF SYSTEMS:  GENERAL: no change in appetite, no fatigue, no weight changes, no fever, chills or weakness RESPIRATORY: no cough, SOB, DOE, wheezing, hemoptysis CARDIAC: no chest pain, edema or palpitations GI: no abdominal pain, diarrhea, constipation, heart burn, nausea or vomiting  PHYSICAL EXAMINATION  VS:  T 97.4      P77      RR20      BP 145/92   WT 178 LBs      GENERAL: no acute distress, normal body habitus EYES: conjunctivae normal, sclerae normal, normal eye lids NECK: supple, trachea  midline, no neck masses, no thyroid tenderness, no thyromegaly RESPIRATORY: breathing is even & unlabored, BS CTAB CARDIAC: RRR, no murmur,no extra heart sounds, no edema GI: abdomen soft, normal BS, no masses, no tenderness, no hepatomegaly, no splenomegaly PSYCHIATRIC: the patient is alert & oriented to person, affect & behavior appropriate  LABS/RADIOLOGY: 09/27/13 WBC 6.7 hemoglobin 9.3 hematocrit 27.6 09/26/13 sodium 135 potassium 3.9 glucose 91 BUN 12 creatinine 0.7 and 30 calcium 8.4   ASSESSMENT/PLAN:  Right hip fracture status post right hip hemiarthroplasty - for Home health PT, OT and Nursing  Urinary frequency - continue Myrbetriq  GERD - continue Prilosec  Dementia - continue Exelon patch and Namenda  Constipation - continue Colace  Hyperlipidemia - continue Crestor and Lovaza  Depression - continue Celexa   I have filled out patient's discharge paperwork and written prescriptions.  Patient will receive home health PT, OT and Nursing. DME provided: Rolling walker  Total discharge time: Greater than 30 minutes Discharge time involved coordination of the discharge process with Child psychotherapistsocial worker, nursing staff and therapy department. Medical justification for home health services/DME verified.     CPT CODE: 4098199316

## 2013-11-05 NOTE — Progress Notes (Signed)
Patient ID: Holly SchwalbeJoan M Hyppolite, female   DOB: 09-Feb-1927, 80 y.o.   MRN: [id number]                         PROGRESS NOTE  DATE: 10/14/13  [facility]  LEVEL OF CARE:   SNF (31)  Acute Visit  CHIEF COMPLAINT:  ManageBronchitis  HISTORY OF PRESENT ILLNESS: This is an 80 year old female who was noted to have productive cough. Slight crackles noted on bilateral lower lung fields. No reported fever. Chest x-ray showed bilateral bronchitis.   PAST MEDICAL HISTORY : Reviewed.  No changes.  [facility]  REVIEW OF SYSTEMS:  GENERAL: no change in appetite, no fatigue, no weight changes, no fever, chills or weakness RESPIRATORY: no SOB, DOE, wheezing, hemoptysis, + cough CARDIAC: no chest pain, edema or palpitations GI: no abdominal pain, diarrhea, constipation, heart burn, nausea or vomiting  PHYSICAL EXAMINATION  VS:  T 97.6      P71     RR 18      BP 129/79     POX 96 %     WT 179.2 lbs  GENERAL: no acute distress, normal body habitus NECK: supple, trachea midline, no neck masses, no thyroid tenderness, no thyromegaly LYMPHATICS: no LAN in the neck, no supraclavicular LAN RESPIRATORY: breathing is even & unlabored, BS +cracles on bilateral lung fields CARDIAC: RRR, no murmur,no extra heart sounds, no edema GI: abdomen soft, normal BS, no masses, no tenderness, no hepatomegaly, no splenomegaly PSYCHIATRIC: the patient is alert & oriented to person, affect & behavior appropriate  LABS/RADIOLOGY: 09/27/13 WBC 6.7 hemoglobin 9.3 hematocrit 27.6 09/26/13 sodium 135 potassium 3.9 glucose 91 BUN 12 creatinine 0.7 and 30 calcium 8.4   ASSESSMENT/PLAN:  Bronchitis - start Doxycycline 100 mg PO BID x 10 days; albutero 2.5 mg/3 ml 1 neb Q 6AM, 2PM and 10 PM x 5 days the Q 6 hours PRN   CPT CODE: [id number]

## 2013-11-05 NOTE — Progress Notes (Signed)
Patient ID: Holly Grimes, female   DOB: 09-13-1927, 80 y.o.   MRN: [id number]                         PROGRESS NOTE  DATE: 10/04/13  FACILITY: Nursing Home Location: [facility]  LEVEL OF CARE: SNF (31)  Acute Visit  CHIEF COMPLAINT:  Manage Candidal skin infection  HISTORY OF PRESENT ILLNESS: This is an 80 year old female who was noted to have erythematous rashes on her left axilla. Patient complains of itching. Patient complained that she is tired of frequently urinating at night. She is requesting her Myrbetriq to be given at night.    PAST MEDICAL HISTORY : Reviewed.  No changes.  [facility]  REVIEW OF SYSTEMS:  GENERAL: no change in appetite, no fatigue, no weight changes, no fever, chills or weakness RESPIRATORY: no cough, SOB, DOE, wheezing, hemoptysis CARDIAC: no chest pain, edema or palpitations GI: no abdominal pain, diarrhea, constipation, heart burn, nausea or vomiting  PHYSICAL EXAMINATION  VS:  T 97.1      P 76     RR 18      BP 116/68     POX 96 %     WT 179.2 lbs  GENERAL: no acute distress, normal body habitus NECK: supple, trachea midline, no neck masses, no thyroid tenderness, no thyromegaly LYMPHATICS: no LAN in the neck, no supraclavicular LAN RESPIRATORY: breathing is even & unlabored, BS CTAB CARDIAC: RRR, no murmur,no extra heart sounds, no edema GI: abdomen soft, normal BS, no masses, no tenderness, no hepatomegaly, no splenomegaly PSYCHIATRIC: the patient is alert & oriented to person, affect & behavior appropriate  LABS/RADIOLOGY: 09/27/13 WBC 6.7 hemoglobin 9.3 hematocrit 27.6 09/26/13 sodium 135 potassium 3.9 glucose 91 BUN 12 creatinine 0.7 and 30 calcium 8.4   ASSESSMENT/PLAN:  Candidal skin infection. Start Nystatin powder 100,000 units/gm to left axilla rashes BID X 2 weeks  Urinary Frequency - change Myrbetriq 25 mg  administration to bedtime  CPT CODE: [id number]

## 2013-11-06 DIAGNOSIS — G20 Parkinson's disease: Secondary | ICD-10-CM | POA: Diagnosis not present

## 2013-11-06 DIAGNOSIS — F0390 Unspecified dementia without behavioral disturbance: Secondary | ICD-10-CM | POA: Diagnosis not present

## 2013-11-06 DIAGNOSIS — Z96649 Presence of unspecified artificial hip joint: Secondary | ICD-10-CM | POA: Diagnosis not present

## 2013-11-06 DIAGNOSIS — M810 Age-related osteoporosis without current pathological fracture: Secondary | ICD-10-CM | POA: Diagnosis not present

## 2013-11-06 DIAGNOSIS — M1990 Unspecified osteoarthritis, unspecified site: Secondary | ICD-10-CM | POA: Diagnosis not present

## 2013-11-06 DIAGNOSIS — Z9181 History of falling: Secondary | ICD-10-CM | POA: Diagnosis not present

## 2013-11-06 DIAGNOSIS — R269 Unspecified abnormalities of gait and mobility: Secondary | ICD-10-CM | POA: Diagnosis not present

## 2013-11-06 DIAGNOSIS — Z471 Aftercare following joint replacement surgery: Secondary | ICD-10-CM | POA: Diagnosis not present

## 2013-11-06 DIAGNOSIS — I10 Essential (primary) hypertension: Secondary | ICD-10-CM | POA: Diagnosis not present

## 2013-11-08 ENCOUNTER — Telehealth: Payer: Self-pay | Admitting: *Deleted

## 2013-11-08 NOTE — Telephone Encounter (Signed)
Holly Grimes, POA, called and stated that her mother fell and broke her hip and [facility]. She was recently discharged and the medications wasn't all right. I informed her to call the rehab to rewrite her Rx or I offered her an appointment to come into the office to be evaluated. She stated that she would call the facility first.

## 2013-11-09 DIAGNOSIS — I10 Essential (primary) hypertension: Secondary | ICD-10-CM | POA: Diagnosis not present

## 2013-11-09 DIAGNOSIS — Z471 Aftercare following joint replacement surgery: Secondary | ICD-10-CM | POA: Diagnosis not present

## 2013-11-09 DIAGNOSIS — M1990 Unspecified osteoarthritis, unspecified site: Secondary | ICD-10-CM | POA: Diagnosis not present

## 2013-11-09 DIAGNOSIS — R269 Unspecified abnormalities of gait and mobility: Secondary | ICD-10-CM | POA: Diagnosis not present

## 2013-11-09 DIAGNOSIS — G20 Parkinson's disease: Secondary | ICD-10-CM | POA: Diagnosis not present

## 2013-11-09 DIAGNOSIS — F0390 Unspecified dementia without behavioral disturbance: Secondary | ICD-10-CM | POA: Diagnosis not present

## 2013-11-10 DIAGNOSIS — M1990 Unspecified osteoarthritis, unspecified site: Secondary | ICD-10-CM | POA: Diagnosis not present

## 2013-11-10 DIAGNOSIS — I10 Essential (primary) hypertension: Secondary | ICD-10-CM | POA: Diagnosis not present

## 2013-11-10 DIAGNOSIS — R269 Unspecified abnormalities of gait and mobility: Secondary | ICD-10-CM | POA: Diagnosis not present

## 2013-11-10 DIAGNOSIS — Z471 Aftercare following joint replacement surgery: Secondary | ICD-10-CM | POA: Diagnosis not present

## 2013-11-10 DIAGNOSIS — F0390 Unspecified dementia without behavioral disturbance: Secondary | ICD-10-CM | POA: Diagnosis not present

## 2013-11-10 DIAGNOSIS — G20 Parkinson's disease: Secondary | ICD-10-CM | POA: Diagnosis not present

## 2013-11-11 ENCOUNTER — Other Ambulatory Visit: Payer: Self-pay | Admitting: *Deleted

## 2013-11-11 MED ORDER — CLOPIDOGREL BISULFATE 75 MG PO TABS: ORAL_TABLET | ORAL | Status: DC

## 2013-11-11 MED ORDER — ROSUVASTATIN CALCIUM 10 MG PO TABS: 10.0000 mg | ORAL_TABLET | Freq: Every day | ORAL | Status: DC

## 2013-11-14 DIAGNOSIS — G20 Parkinson's disease: Secondary | ICD-10-CM | POA: Diagnosis not present

## 2013-11-14 DIAGNOSIS — F0390 Unspecified dementia without behavioral disturbance: Secondary | ICD-10-CM | POA: Diagnosis not present

## 2013-11-14 DIAGNOSIS — R269 Unspecified abnormalities of gait and mobility: Secondary | ICD-10-CM | POA: Diagnosis not present

## 2013-11-14 DIAGNOSIS — M1990 Unspecified osteoarthritis, unspecified site: Secondary | ICD-10-CM | POA: Diagnosis not present

## 2013-11-14 DIAGNOSIS — I10 Essential (primary) hypertension: Secondary | ICD-10-CM | POA: Diagnosis not present

## 2013-11-14 DIAGNOSIS — Z471 Aftercare following joint replacement surgery: Secondary | ICD-10-CM | POA: Diagnosis not present

## 2013-11-15 DIAGNOSIS — Z471 Aftercare following joint replacement surgery: Secondary | ICD-10-CM | POA: Diagnosis not present

## 2013-11-15 DIAGNOSIS — F0390 Unspecified dementia without behavioral disturbance: Secondary | ICD-10-CM | POA: Diagnosis not present

## 2013-11-15 DIAGNOSIS — R269 Unspecified abnormalities of gait and mobility: Secondary | ICD-10-CM | POA: Diagnosis not present

## 2013-11-15 DIAGNOSIS — I10 Essential (primary) hypertension: Secondary | ICD-10-CM | POA: Diagnosis not present

## 2013-11-15 DIAGNOSIS — G20 Parkinson's disease: Secondary | ICD-10-CM | POA: Diagnosis not present

## 2013-11-15 DIAGNOSIS — M1990 Unspecified osteoarthritis, unspecified site: Secondary | ICD-10-CM | POA: Diagnosis not present

## 2013-11-16 DIAGNOSIS — F0390 Unspecified dementia without behavioral disturbance: Secondary | ICD-10-CM | POA: Diagnosis not present

## 2013-11-16 DIAGNOSIS — Z471 Aftercare following joint replacement surgery: Secondary | ICD-10-CM | POA: Diagnosis not present

## 2013-11-16 DIAGNOSIS — M1990 Unspecified osteoarthritis, unspecified site: Secondary | ICD-10-CM | POA: Diagnosis not present

## 2013-11-16 DIAGNOSIS — R269 Unspecified abnormalities of gait and mobility: Secondary | ICD-10-CM | POA: Diagnosis not present

## 2013-11-16 DIAGNOSIS — G20 Parkinson's disease: Secondary | ICD-10-CM | POA: Diagnosis not present

## 2013-11-16 DIAGNOSIS — I10 Essential (primary) hypertension: Secondary | ICD-10-CM | POA: Diagnosis not present

## 2013-11-17 DIAGNOSIS — M1990 Unspecified osteoarthritis, unspecified site: Secondary | ICD-10-CM | POA: Diagnosis not present

## 2013-11-17 DIAGNOSIS — Z471 Aftercare following joint replacement surgery: Secondary | ICD-10-CM | POA: Diagnosis not present

## 2013-11-17 DIAGNOSIS — R269 Unspecified abnormalities of gait and mobility: Secondary | ICD-10-CM | POA: Diagnosis not present

## 2013-11-17 DIAGNOSIS — G20 Parkinson's disease: Secondary | ICD-10-CM | POA: Diagnosis not present

## 2013-11-17 DIAGNOSIS — I10 Essential (primary) hypertension: Secondary | ICD-10-CM | POA: Diagnosis not present

## 2013-11-17 DIAGNOSIS — F0390 Unspecified dementia without behavioral disturbance: Secondary | ICD-10-CM | POA: Diagnosis not present

## 2013-11-17 DIAGNOSIS — S72023B Displaced fracture of epiphysis (separation) (upper) of unspecified femur, initial encounter for open fracture type I or II: Secondary | ICD-10-CM | POA: Diagnosis not present

## 2013-11-17 DIAGNOSIS — Z96649 Presence of unspecified artificial hip joint: Secondary | ICD-10-CM | POA: Diagnosis not present

## 2013-11-18 DIAGNOSIS — G20 Parkinson's disease: Secondary | ICD-10-CM | POA: Diagnosis not present

## 2013-11-18 DIAGNOSIS — M1990 Unspecified osteoarthritis, unspecified site: Secondary | ICD-10-CM | POA: Diagnosis not present

## 2013-11-18 DIAGNOSIS — F0390 Unspecified dementia without behavioral disturbance: Secondary | ICD-10-CM | POA: Diagnosis not present

## 2013-11-18 DIAGNOSIS — I10 Essential (primary) hypertension: Secondary | ICD-10-CM | POA: Diagnosis not present

## 2013-11-18 DIAGNOSIS — R269 Unspecified abnormalities of gait and mobility: Secondary | ICD-10-CM | POA: Diagnosis not present

## 2013-11-18 DIAGNOSIS — Z471 Aftercare following joint replacement surgery: Secondary | ICD-10-CM | POA: Diagnosis not present

## 2013-11-21 ENCOUNTER — Telehealth: Payer: Self-pay | Admitting: *Deleted

## 2013-11-21 DIAGNOSIS — M1990 Unspecified osteoarthritis, unspecified site: Secondary | ICD-10-CM | POA: Diagnosis not present

## 2013-11-21 DIAGNOSIS — R269 Unspecified abnormalities of gait and mobility: Secondary | ICD-10-CM | POA: Diagnosis not present

## 2013-11-21 DIAGNOSIS — I10 Essential (primary) hypertension: Secondary | ICD-10-CM | POA: Diagnosis not present

## 2013-11-21 DIAGNOSIS — Z471 Aftercare following joint replacement surgery: Secondary | ICD-10-CM | POA: Diagnosis not present

## 2013-11-21 DIAGNOSIS — F0390 Unspecified dementia without behavioral disturbance: Secondary | ICD-10-CM | POA: Diagnosis not present

## 2013-11-21 DIAGNOSIS — G20 Parkinson's disease: Secondary | ICD-10-CM | POA: Diagnosis not present

## 2013-11-21 NOTE — Telephone Encounter (Signed)
Patient daughter called and wants refill on Myrbetriq 50mg  once daily. Please Advise if this is ok?

## 2013-11-21 NOTE — Telephone Encounter (Signed)
It is.  She should be seen soon if possible.  I know she broke her hip and went to rehab.

## 2013-11-23 ENCOUNTER — Encounter: Payer: Self-pay | Admitting: Nurse Practitioner

## 2013-11-23 ENCOUNTER — Ambulatory Visit (INDEPENDENT_AMBULATORY_CARE_PROVIDER_SITE_OTHER): Payer: Medicare Other | Admitting: Nurse Practitioner

## 2013-11-23 VITALS — BP 104/76 | HR 77 | Temp 98.2°F | Resp 18 | Wt 178.0 lb

## 2013-11-23 DIAGNOSIS — J209 Acute bronchitis, unspecified: Secondary | ICD-10-CM

## 2013-11-23 DIAGNOSIS — H109 Unspecified conjunctivitis: Secondary | ICD-10-CM | POA: Diagnosis not present

## 2013-11-23 MED ORDER — MIRABEGRON ER 50 MG PO TB24: 50.0000 mg | ORAL_TABLET | Freq: Every day | ORAL | Status: DC

## 2013-11-23 MED ORDER — CIPROFLOXACIN HCL 0.3 % OP SOLN: 2.0000 [drp] | OPHTHALMIC | Status: DC

## 2013-11-23 MED ORDER — MOXIFLOXACIN HCL 400 MG PO TABS
400.0000 mg | ORAL_TABLET | Freq: Every day | ORAL | Status: DC
Start: 2013-11-23 — End: 2013-11-30

## 2013-11-23 NOTE — Telephone Encounter (Signed)
Spoke with patient's daughter. [facility] Patient's daughter mentioned that she may consider changing doctor's offices due to unavailability with Dr.Reed's schedule. Patient is currently with bronchitis and needs to be seen today if possible, patient scheduled to see Candelaria CelesteJessica Karam today at 4:00 pm for this acute concern. Shanda BumpsJessica will inform patient at appointment today if appointment she already had scheduled for 11/30/2013 should be kept or cancelled.

## 2013-11-23 NOTE — Progress Notes (Signed)
Patient ID: Holly Grimes, female   DOB: [birth year], 80 y.o.   MRN: [id number]    Allergies  Allergen Reactions  . Cephalexin Nausea And Vomiting  . Sulfa Antibiotics Other (See Comments)    Daughter unsure of reaction, but believes it is severe  . Sulfites     Chief Complaint  Patient presents with  . Acute Visit    chest congestion/cough with green phlegm since January 7th, also having red eyes yellow drainage x 3 days.    HPI: Patient is a 80 y.o. female seen in the office today for on going bronchitis; Pt with dementia and unable to provide history; HPI and ROS provided from daughter who is primary caregiver; pt has recently been discharged from [facility] after hip surgery; was treated in [facility] for bronchitis but daughter reports this never got better; cough and congestion and productive cough- dark green sputum for several days Using mucinex with delsym  Eye has been looking bad for past several days, rapid onset, thick yellow sticky discharge-- has not been treated for this 99.7 temp on Saturday after Holly Grimes was given tylenol and has had a temp for several days with chills Decrease appetite but still drinking  Review of Systems:  Review of Systems  Constitutional: Positive for fever, chills and malaise/fatigue.  HENT: Negative for congestion and sore throat.   Respiratory: Positive for cough, sputum production, shortness of breath and wheezing.   Cardiovascular: Positive for palpitations (per home health had rapid heart rate). Negative for chest pain.  Gastrointestinal: Negative for abdominal pain, diarrhea and constipation.  Genitourinary: Positive for frequency.  Skin: Negative.      Past Medical History  Diagnosis Date  . Hypertension   . High cholesterol   . Mitral valve prolapse   . Dementia   . Parkinson's disease, Lewy body   . Unspecified constipation   . Onychia and paronychia of toe   . Spasm of muscle   . Abnormality of gait   . Orthostatic  hypotension   . Major depressive disorder, single episode, unspecified   . Diaphragmatic hernia without mention of obstruction or gangrene   . Alzheimer's disease   . Osteoarthrosis, unspecified whether generalized or localized, unspecified site   . Osteoporosis, unspecified   . Unspecified constipation   . Sprain of ribs   . Delirium due to conditions classified elsewhere   . Diarrhea   . Edema   . Pain in joint, pelvic region and thigh   . Other malaise and fatigue   . Urinary frequency [id number]  . Personal history of fall   . Female stress incontinence   . Other and unspecified hyperlipidemia   . Obesity, unspecified   . Osteoarthrosis, unspecified whether generalized or localized, unspecified site   . Pain in joint, shoulder region   . Transient ischemic attack (TIA), and cerebral infarction without residual deficits(V12.54)    Past Surgical History  Procedure Laterality Date  . Joint replacement    . Tonsillectomy    . Ankle surgery Left 1998  . Total knee arthroplasty Right 2002  . Total knee arthroplasty Left 2005  . Hip arthroplasty Right 09/24/2013    Procedure: ARTHROPLASTY BIPOLAR HIP;  Surgeon: Loanne Drilling, MD;  Location: [facility];  Service: Orthopedics;  Laterality: Right;   Social History:   reports that Holly Grimes has never smoked. Holly Grimes does not have any smokeless tobacco history on file. Holly Grimes reports that Holly Grimes does not drink alcohol or use illicit drugs.  Family History  Problem Relation Age of Onset  . Adopted: Yes    Medications: Patient's Medications  New Prescriptions   No medications on file  Previous Medications   ACETAMINOPHEN (TYLENOL) 325 MG TABLET    Take 2 tablets (650 mg total) by mouth every 6 (six) hours as needed for mild pain or fever.   ALBUTEROL (ACCUNEB) 1.25 MG/3ML NEBULIZER SOLUTION    Take 1 ampule by nebulization every 6 (six) hours as needed for wheezing.   ASCORBIC ACID (VITAMIN C WITH ROSE HIPS) 1000 MG TABLET    Take 1,000 mg by mouth  daily.     CHOLECALCIFEROL (VITAMIN D3) 2000 UNITS TABS    Take 2,000 Units by mouth daily.    CITALOPRAM (CELEXA) 20 MG TABLET    Take 20 mg by mouth at bedtime.    CLOPIDOGREL (PLAVIX) 75 MG TABLET    Take one tablet by mouth once daily to help circulation   DOCUSATE SODIUM (COLACE) 100 MG CAPSULE    Take 100 mg by mouth daily as needed.    FLUTICASONE (CUTIVATE) 0.05 % CREAM    Apply 1 application topically 2 (two) times daily. To affected areas   GUAIFENESIN (MUCINEX) 600 MG 12 HR TABLET    Take by mouth 2 (two) times daily.   HALOPERIDOL (HALDOL) 0.5 MG TABLET    Take 0.5 mg by mouth every 6 (six) hours as needed for agitation.    MEMANTINE HCL ER (NAMENDA XR) 28 MG CP24    Take 28 mg by mouth at bedtime. Take one tablet once a day to preserve memory   MIRABEGRON ER (MYRBETRIQ) 50 MG TB24 TABLET    Take 1 tablet (50 mg total) by mouth daily.   MULTIPLE VITAMINS-MINERALS (CENTRUM SILVER ULTRA WOMENS PO)    Take 1 tablet by mouth every morning.     NYSTATIN (MYCOSTATIN/NYSTOP) 100000 UNIT/GM POWD    Apply topically. 2 times a day for 2 weeks in LT auxillia   OMEGA-3 ACID ETHYL ESTERS (LOVAZA) 1 G CAPSULE    Take 2 g by mouth 2 (two) times daily.     OMEPRAZOLE (PRILOSEC) 20 MG CAPSULE    Take 20 mg by mouth daily.     PROBIOTIC PRODUCT (ALIGN) 4 MG CAPS    Take 1 capsule (4 mg total) by mouth daily.   RIVASTIGMINE (EXELON) 4.6 MG/24HR    Place 1 patch onto the skin daily.    ROSUVASTATIN (CRESTOR) 10 MG TABLET    Take 1 tablet (10 mg total) by mouth at bedtime.  Modified Medications   No medications on file  Discontinued Medications   ENOXAPARIN (LOVENOX) 40 MG/0.4ML INJECTION    Inject 0.4 mLs (40 mg total) into the skin daily.   HYDROCODONE-ACETAMINOPHEN (VICODIN) 5-500 MG PER TABLET    Take 1 tablet by mouth every 6 (six) hours as needed (for severe pain only, to be used as last resort if Tylenol and rest doesnt help).     Physical Exam:  Filed Vitals:   11/23/13 1631  BP: 104/76    Pulse: 77  Temp: 98.2 F (36.8 C)  TempSrc: Oral  Resp: 18  Weight: 178 lb (80.74 kg)  SpO2: 95%    Physical Exam  Constitutional: Holly Grimes is well-developed, well-nourished, and in no distress.  HENT:  Head: Normocephalic and atraumatic.  Nose: Nose normal.  Mouth/Throat: Oropharynx is clear and moist. No oropharyngeal exudate.  Eyes: Right eye exhibits exudate. Left eye exhibits exudate. Right conjunctiva is  injected. Left conjunctiva is injected.  Cardiovascular: Normal rate, regular rhythm and normal heart sounds.   Pulmonary/Chest: Effort normal and breath sounds normal. No respiratory distress. Holly Grimes has no wheezes. Holly Grimes has no rales.  Abdominal: Soft. Bowel sounds are normal. Holly Grimes exhibits no distension.  Neurological: Holly Grimes is alert.  Skin: Skin is warm and dry. Holly Grimes is not diaphoretic. No erythema.  Psychiatric: Affect normal.     Labs reviewed: Basic Metabolic Panel:  Recent Labs  36/64/4012/20/14 0620 09/25/13 0516 09/26/13 0506  NA 137 139 135  K 3.8 3.8 3.9  CL 101 106 101  CO2 24 26 26   GLUCOSE 118* 132* 91  BUN 27* 19 12  CREATININE 0.63 0.63 0.73  CALCIUM 9.0 8.6 8.4     Recent Labs  09/23/13 0605  09/25/13 0516 09/26/13 0506 09/27/13 0506  WBC 7.1  < > 14.4* 8.2 6.7  NEUTROABS 5.3  --   --   --   --   HGB 13.8  < > 10.1* 9.0* 9.3*  HCT 40.3  < > 30.0* 27.4* 27.6*  MCV 97.6  < > 97.7 100.4* 100.0  PLT 179  < > 142* 145* 148*  < > = values in this interval not displayed.   Assessment/Plan 1. Acute bronchitis Mucinex DM (or mucinex with delsym 12 hour) 1 tablet twice daily with full glass of water for 1 week then as needed for cough and congestion  Albuterol by nebulizer three times daily while awake (or attempt this) for 1 week then as needed Florastor twice daily for 2 weeks  - moxifloxacin (AVELOX) 400 MG tablet; Take 1 tablet (400 mg total) by mouth daily.  Dispense: 7 tablet; Refill: 0 encourage good PO intake -instructions given to when to seek  immediate medical attention- daughter understands  2. Conjunctivitis -complicated by dementia; pt is keeping hands in eyes despite ongoing education -Rx faxed to pharm; ciloxan 2 drops into both eyes every 2 hours for 2 days then every 4 hours for 5 days after that -use warm wash cloth with baby shampoo to clean eyes twice daily  To keep follow up in 1 week

## 2013-11-23 NOTE — Patient Instructions (Addendum)
Mucinex DM (or mucinex with delsym 12 hour) 1 tablet twice daily with full glass of water for 1 week then as needed for cough and congestion  Albuterol by nebulizer three times daily while awake (or attempt this)  Antibiotic sent to pharmacy - once daily for 1 week  Florastor twice daily for 2 weeks  Bronchitis Bronchitis is inflammation of the airways that extend from the windpipe into the lungs (bronchi). The inflammation often causes mucus to develop, which leads to a cough. If the inflammation becomes severe, it may cause shortness of breath. CAUSES  Bronchitis may be caused by:   Viral infections.   Bacteria.   Cigarette smoke.   Allergens, pollutants, and other irritants.  SIGNS AND SYMPTOMS  The most common symptom of bronchitis is a frequent cough that produces mucus. Other symptoms include:  Fever.   Body aches.   Chest congestion.   Chills.   Shortness of breath.   Sore throat.  DIAGNOSIS  Bronchitis is usually diagnosed through a medical history and physical exam. Tests, such as chest X-rays, are sometimes done to rule out other conditions.  TREATMENT  You may need to avoid contact with whatever caused the problem (smoking, for example). Medicines are sometimes needed. These may include:  Antibiotics. These may be prescribed if the condition is caused by bacteria.  Cough suppressants. These may be prescribed for relief of cough symptoms.   Inhaled medicines. These may be prescribed to help open your airways and make it easier for you to breathe.   Steroid medicines. These may be prescribed for those with recurrent (chronic) bronchitis. HOME CARE INSTRUCTIONS  Get plenty of rest.   Drink enough fluids to keep your urine clear or pale yellow (unless you have a medical condition that requires fluid restriction). Increasing fluids may help thin your secretions and will prevent dehydration.   Only take over-the-counter or prescription medicines as  directed by your health care provider.  Only take antibiotics as directed. Make sure you finish them even if you start to feel better.  Avoid secondhand smoke, irritating chemicals, and strong fumes. These will make bronchitis worse. If you are a smoker, quit smoking. Consider using nicotine gum or skin patches to help control withdrawal symptoms. Quitting smoking will help your lungs heal faster.   Put a cool-mist humidifier in your bedroom at night to moisten the air. This may help loosen mucus. Change the water in the humidifier daily. You can also run the hot water in your shower and sit in the bathroom with the door closed for 5 10 minutes.   Follow up with your health care provider as directed.   Wash your hands frequently to avoid catching bronchitis again or spreading an infection to others.  SEEK MEDICAL CARE IF: Your symptoms do not improve after 1 week of treatment.  SEEK IMMEDIATE MEDICAL CARE IF:  Your fever increases.  You have chills.   You have chest pain.   You have worsening shortness of breath.   You have bloody sputum.  You faint.  You have lightheadedness.  You have a severe headache.   You vomit repeatedly. MAKE SURE YOU:   Understand these instructions.  Will watch your condition.  Will get help right away if you are not doing well or get worse. Document Released: 09/22/2005 Document Revised: 07/13/2013 Document Reviewed: 05/17/2013 [facility] Patient Information 2014 [employer] [place].

## 2013-11-29 DIAGNOSIS — M1990 Unspecified osteoarthritis, unspecified site: Secondary | ICD-10-CM | POA: Diagnosis not present

## 2013-11-29 DIAGNOSIS — Z471 Aftercare following joint replacement surgery: Secondary | ICD-10-CM | POA: Diagnosis not present

## 2013-11-29 DIAGNOSIS — R269 Unspecified abnormalities of gait and mobility: Secondary | ICD-10-CM | POA: Diagnosis not present

## 2013-11-29 DIAGNOSIS — G20 Parkinson's disease: Secondary | ICD-10-CM | POA: Diagnosis not present

## 2013-11-29 DIAGNOSIS — F0390 Unspecified dementia without behavioral disturbance: Secondary | ICD-10-CM | POA: Diagnosis not present

## 2013-11-29 DIAGNOSIS — I10 Essential (primary) hypertension: Secondary | ICD-10-CM | POA: Diagnosis not present

## 2013-11-30 ENCOUNTER — Ambulatory Visit (INDEPENDENT_AMBULATORY_CARE_PROVIDER_SITE_OTHER): Payer: Medicare Other | Admitting: Nurse Practitioner

## 2013-11-30 ENCOUNTER — Encounter: Payer: Self-pay | Admitting: Nurse Practitioner

## 2013-11-30 VITALS — BP 118/70 | HR 73 | Temp 98.0°F | Resp 14 | Wt 176.6 lb

## 2013-11-30 DIAGNOSIS — E785 Hyperlipidemia, unspecified: Secondary | ICD-10-CM | POA: Diagnosis not present

## 2013-11-30 DIAGNOSIS — J209 Acute bronchitis, unspecified: Secondary | ICD-10-CM | POA: Diagnosis not present

## 2013-11-30 DIAGNOSIS — I10 Essential (primary) hypertension: Secondary | ICD-10-CM | POA: Diagnosis not present

## 2013-11-30 DIAGNOSIS — F329 Major depressive disorder, single episode, unspecified: Secondary | ICD-10-CM | POA: Diagnosis not present

## 2013-11-30 DIAGNOSIS — H109 Unspecified conjunctivitis: Secondary | ICD-10-CM

## 2013-11-30 DIAGNOSIS — F04 Amnestic disorder due to known physiological condition: Secondary | ICD-10-CM | POA: Diagnosis not present

## 2013-11-30 DIAGNOSIS — B372 Candidiasis of skin and nail: Secondary | ICD-10-CM

## 2013-11-30 DIAGNOSIS — F3289 Other specified depressive episodes: Secondary | ICD-10-CM | POA: Diagnosis not present

## 2013-11-30 MED ORDER — FLUTICASONE PROPIONATE 0.05 % EX CREA: 1.0000 "application " | TOPICAL_CREAM | Freq: Two times a day (BID) | CUTANEOUS | Status: DC

## 2013-11-30 MED ORDER — NYSTATIN 100000 UNIT/GM EX POWD: CUTANEOUS | Status: DC

## 2013-11-30 NOTE — Progress Notes (Signed)
Patient ID: Holly Grimes, female   DOB: 1927/02/02, 80 y.o.   MRN: [id number]    Allergies  Allergen Reactions  . Cephalexin Nausea And Vomiting  . Sulfa Antibiotics Other (See Comments)    Daughter unsure of reaction, but believes it is severe  . Sulfites     Chief Complaint  Patient presents with  . Follow-up    f/u and stil having some discharge form her LT eye  . other    some questions regarding needing steriod cream    HPI: Patient is a 80 y.o. female seen in the office today for follow up on bronchitis, has gotten better and worse throughout the week; daughter almost took her to the ED but decided not to. Eating and drinking comes and goes; drinking water and orange juice -- drinking boost, has done better in the last day.  Has done cipro drops but still has some drainage in the left eye, itching less but still will mess with her eyes  Has irritation under left armpit, uses nystatin in the past, would like refill No fevers or chills Review of Systems:  Review of Systems  Constitutional: Negative for fever, chills and malaise/fatigue.  HENT: Negative for congestion, ear pain and sore throat.   Respiratory: Positive for cough and sputum production. Negative for shortness of breath and wheezing.        Cough and sputum productive has improved but still there  Cardiovascular: Negative for chest pain, palpitations and leg swelling.  Gastrointestinal: Negative for heartburn, abdominal pain, diarrhea and constipation.  Skin: Negative.      Past Medical History  Diagnosis Date  . Hypertension   . High cholesterol   . Mitral valve prolapse   . Dementia   . Parkinson's disease, Lewy body   . Unspecified constipation   . Onychia and paronychia of toe   . Spasm of muscle   . Abnormality of gait   . Orthostatic hypotension   . Major depressive disorder, single episode, unspecified   . Diaphragmatic hernia without mention of obstruction or gangrene   . Alzheimer's disease     . Osteoarthrosis, unspecified whether generalized or localized, unspecified site   . Osteoporosis, unspecified   . Unspecified constipation   . Sprain of ribs   . Delirium due to conditions classified elsewhere   . Diarrhea   . Edema   . Pain in joint, pelvic region and thigh   . Other malaise and fatigue   . Urinary frequency [id number]  . Personal history of fall   . Female stress incontinence   . Other and unspecified hyperlipidemia   . Obesity, unspecified   . Osteoarthrosis, unspecified whether generalized or localized, unspecified site   . Pain in joint, shoulder region   . Transient ischemic attack (TIA), and cerebral infarction without residual deficits(V12.54)    Past Surgical History  Procedure Laterality Date  . Joint replacement    . Tonsillectomy    . Ankle surgery Left 1998  . Total knee arthroplasty Right 2002  . Total knee arthroplasty Left 2005  . Hip arthroplasty Right 09/24/2013    Procedure: ARTHROPLASTY BIPOLAR HIP;  Surgeon: Loanne DrillingFrank V Aluisio, MD;  Location: [facility];  Service: Orthopedics;  Laterality: Right;   Social History:   reports that she has never smoked. She does not have any smokeless tobacco history on file. She reports that she does not drink alcohol or use illicit drugs.  Family History  Problem Relation Age of Onset  .  Adopted: Yes    Medications: Patient's Medications  New Prescriptions   No medications on file  Previous Medications   ACETAMINOPHEN (TYLENOL) 325 MG TABLET    Take 2 tablets (650 mg total) by mouth every 6 (six) hours as needed for mild pain or fever.   ALBUTEROL (ACCUNEB) 1.25 MG/3ML NEBULIZER SOLUTION    Take 1 ampule by nebulization every 6 (six) hours as needed for wheezing.   ASCORBIC ACID (VITAMIN C WITH ROSE HIPS) 1000 MG TABLET    Take 1,000 mg by mouth daily.     CHOLECALCIFEROL (VITAMIN D3) 2000 UNITS TABS    Take 2,000 Units by mouth daily.    CIPROFLOXACIN (CILOXAN) 0.3 % OPHTHALMIC SOLUTION    Place 2 drops  into both eyes every 2 (two) hours. Administer 1 drop, every 2 hours, while awake, for 2 days. Then 1 drop, every 4 hours, while awake, for the next 5 days.   CITALOPRAM (CELEXA) 40 MG TABLET    Take 40 mg by mouth at bedtime.   CLOPIDOGREL (PLAVIX) 75 MG TABLET    Take one tablet by mouth once daily to help circulation   DOCUSATE SODIUM (COLACE) 100 MG CAPSULE    Take 100 mg by mouth daily as needed.    FLUTICASONE (CUTIVATE) 0.05 % CREAM    Apply 1 application topically 2 (two) times daily. To affected areas   GUAIFENESIN (MUCINEX) 600 MG 12 HR TABLET    Take by mouth 2 (two) times daily.   HALOPERIDOL (HALDOL) 0.5 MG TABLET    Take 0.5 mg by mouth every 6 (six) hours as needed for agitation.    MEMANTINE HCL ER (NAMENDA XR) 28 MG CP24    Take 28 mg by mouth at bedtime. Take one tablet once a day to preserve memory   MIRABEGRON ER (MYRBETRIQ) 50 MG TB24 TABLET    Take 1 tablet (50 mg total) by mouth daily.   MULTIPLE VITAMINS-MINERALS (CENTRUM SILVER ULTRA WOMENS PO)    Take 1 tablet by mouth every morning.     NYSTATIN (MYCOSTATIN/NYSTOP) 100000 UNIT/GM POWD    Apply topically. 2 times a day for 2 weeks in LT auxillia   OMEGA-3 ACID ETHYL ESTERS (LOVAZA) 1 G CAPSULE    Take 2 g by mouth 2 (two) times daily.     OMEPRAZOLE (PRILOSEC) 20 MG CAPSULE    Take 20 mg by mouth daily.     PROBIOTIC PRODUCT (ALIGN) 4 MG CAPS    Take 1 capsule (4 mg total) by mouth daily.   RIVASTIGMINE (EXELON) 4.6 MG/24HR    Place 1 patch onto the skin daily.    ROSUVASTATIN (CRESTOR) 10 MG TABLET    Take 1 tablet (10 mg total) by mouth at bedtime.  Modified Medications   No medications on file  Discontinued Medications   CITALOPRAM (CELEXA) 20 MG TABLET    Take 20 mg by mouth at bedtime.    MOXIFLOXACIN (AVELOX) 400 MG TABLET    Take 1 tablet (400 mg total) by mouth daily.     Physical Exam:  Filed Vitals:   11/30/13 1509  BP: 118/70  Pulse: 73  Temp: 98 F (36.7 C)  TempSrc: Oral  Resp: 14  Weight: 176  lb 9.6 oz (80.105 kg)  SpO2: 98%  Physical Exam  Constitutional: She is well-developed, well-nourished, and in no distress.  HENT:  Head: Normocephalic and atraumatic.  Nose: Nose normal.  Mouth/Throat: Oropharynx is clear and moist. No oropharyngeal exudate.  Eyes: Conjunctivae, EOM and lids are normal. Pupils are equal, round, and reactive to light.  Cardiovascular: Normal rate, regular rhythm and normal heart sounds.   Pulmonary/Chest: Effort normal and breath sounds normal. No respiratory distress. She has no wheezes. She has no rales.  Abdominal: Soft. Bowel sounds are normal. She exhibits no distension.  Neurological: She is alert.  Skin: Skin is warm and dry. Rash (red yeast rash under left axilla) noted. She is not diaphoretic.  Psychiatric: Affect normal.    Labs reviewed: Basic Metabolic Panel:  Recent Labs  16/10/96 0620 09/25/13 0516 09/26/13 0506  NA 137 139 135  K 3.8 3.8 3.9  CL 101 106 101  CO2 24 26 26   GLUCOSE 118* 132* 91  BUN 27* 19 12  CREATININE 0.63 0.63 0.73  CALCIUM 9.0 8.6 8.4   Liver Function Tests: No results found for this basename: AST, ALT, ALKPHOS, BILITOT, PROT, ALBUMIN,  in the last 8760 hours No results found for this basename: LIPASE, AMYLASE,  in the last 8760 hours No results found for this basename: AMMONIA,  in the last 8760 hours CBC:  Recent Labs  09/23/13 0605  09/25/13 0516 09/26/13 0506 09/27/13 0506  WBC 7.1  < > 14.4* 8.2 6.7  NEUTROABS 5.3  --   --   --   --   HGB 13.8  < > 10.1* 9.0* 9.3*  HCT 40.3  < > 30.0* 27.4* 27.6*  MCV 97.6  < > 97.7 100.4* 100.0  PLT 179  < > 142* 145* 148*  < > = values in this interval not displayed.   Assessment/Plan  1. Conjunctivitis -improved but still with discharge in the morning and evening -cont eye drops for 4 more days -cont baby shampoo for 1 week   2. Acute bronchitis -improved; finished avelox tolerated well  -cont symptom management, mucinex twice daily for 1 week  and delsym as needed for cough -may cont neb as needed  -proper nutrition  -encourage good hydration - Basic metabolic panel - CBC With differential/Platelet  3. Candidal skin infection -keep clean and dry -may use OTC hydrocortisone with fluticasone cream as needed  - fluticasone (CUTIVATE) 0.05 % cream; Apply 1 application topically 2 (two) times daily. To affected areas  Dispense: 30 g; Refill: 1 - nystatin (MYCOSTATIN/NYSTOP) 100000 UNIT/GM POWD; 2 times a day for 2 weeks in LT auxillia  Dispense: 30 g; Refill: 1  Keep follow up with Dr Renato Gails and follow up before if needed

## 2013-11-30 NOTE — Patient Instructions (Signed)
Cont mucinex twice daily for 1 week   Cont eye drops for 4-7 days Cont to wash with baby shampoo  Will get blood work today  For underarm Use nystatin cream and powder  May use OTC hydrocortisone as well to help with irritation

## 2013-12-01 LAB — CBC WITH DIFFERENTIAL
BASOS: 1 %
Basophils Absolute: 0 10*3/uL (ref 0.0–0.2)
Eos: 17 %
Eosinophils Absolute: 1.1 10*3/uL — ABNORMAL HIGH (ref 0.0–0.4)
HCT: 36.3 % (ref 34.0–46.6)
HEMOGLOBIN: 11.9 g/dL (ref 11.1–15.9)
Immature Grans (Abs): 0.1 10*3/uL (ref 0.0–0.1)
Immature Granulocytes: 2 %
LYMPHS ABS: 1.3 10*3/uL (ref 0.7–3.1)
Lymphs: 20 %
MCH: 30.9 pg (ref 26.6–33.0)
MCHC: 32.8 g/dL (ref 31.5–35.7)
MCV: 94 fL (ref 79–97)
MONOS ABS: 0.7 10*3/uL (ref 0.1–0.9)
Monocytes: 11 %
NEUTROS ABS: 3.3 10*3/uL (ref 1.4–7.0)
Neutrophils Relative %: 49 %
Platelets: 320 10*3/uL (ref 150–379)
RBC: 3.85 x10E6/uL (ref 3.77–5.28)
RDW: 13.1 % (ref 12.3–15.4)
WBC: 6.5 10*3/uL (ref 3.4–10.8)

## 2013-12-01 LAB — BASIC METABOLIC PANEL
BUN / CREAT RATIO: 18 (ref 11–26)
BUN: 14 mg/dL (ref 8–27)
CHLORIDE: 101 mmol/L (ref 97–108)
CO2: 24 mmol/L (ref 18–29)
Calcium: 9.1 mg/dL (ref 8.7–10.3)
Creatinine, Ser: 0.79 mg/dL (ref 0.57–1.00)
GFR calc non Af Amer: 69 mL/min/{1.73_m2} (ref >59)
GFR, EST AFRICAN AMERICAN: 79 mL/min/{1.73_m2} (ref >59)
Glucose: 107 mg/dL — ABNORMAL HIGH (ref 65–99)
Potassium: 4.3 mmol/L (ref 3.5–5.2)
SODIUM: 140 mmol/L (ref 134–144)

## 2013-12-02 ENCOUNTER — Encounter: Payer: Self-pay | Admitting: *Deleted

## 2013-12-02 DIAGNOSIS — R269 Unspecified abnormalities of gait and mobility: Secondary | ICD-10-CM | POA: Diagnosis not present

## 2013-12-02 DIAGNOSIS — Z471 Aftercare following joint replacement surgery: Secondary | ICD-10-CM | POA: Diagnosis not present

## 2013-12-02 DIAGNOSIS — I10 Essential (primary) hypertension: Secondary | ICD-10-CM | POA: Diagnosis not present

## 2013-12-02 DIAGNOSIS — M1990 Unspecified osteoarthritis, unspecified site: Secondary | ICD-10-CM | POA: Diagnosis not present

## 2013-12-02 DIAGNOSIS — G20 Parkinson's disease: Secondary | ICD-10-CM | POA: Diagnosis not present

## 2013-12-02 DIAGNOSIS — F0390 Unspecified dementia without behavioral disturbance: Secondary | ICD-10-CM | POA: Diagnosis not present

## 2013-12-05 DIAGNOSIS — M1990 Unspecified osteoarthritis, unspecified site: Secondary | ICD-10-CM | POA: Diagnosis not present

## 2013-12-05 DIAGNOSIS — Z471 Aftercare following joint replacement surgery: Secondary | ICD-10-CM | POA: Diagnosis not present

## 2013-12-05 DIAGNOSIS — F0390 Unspecified dementia without behavioral disturbance: Secondary | ICD-10-CM | POA: Diagnosis not present

## 2013-12-05 DIAGNOSIS — R269 Unspecified abnormalities of gait and mobility: Secondary | ICD-10-CM | POA: Diagnosis not present

## 2013-12-05 DIAGNOSIS — I10 Essential (primary) hypertension: Secondary | ICD-10-CM | POA: Diagnosis not present

## 2013-12-05 DIAGNOSIS — G20 Parkinson's disease: Secondary | ICD-10-CM | POA: Diagnosis not present

## 2013-12-07 DIAGNOSIS — Z471 Aftercare following joint replacement surgery: Secondary | ICD-10-CM | POA: Diagnosis not present

## 2013-12-07 DIAGNOSIS — R269 Unspecified abnormalities of gait and mobility: Secondary | ICD-10-CM | POA: Diagnosis not present

## 2013-12-07 DIAGNOSIS — G20 Parkinson's disease: Secondary | ICD-10-CM | POA: Diagnosis not present

## 2013-12-07 DIAGNOSIS — M1990 Unspecified osteoarthritis, unspecified site: Secondary | ICD-10-CM | POA: Diagnosis not present

## 2013-12-07 DIAGNOSIS — F0390 Unspecified dementia without behavioral disturbance: Secondary | ICD-10-CM | POA: Diagnosis not present

## 2013-12-07 DIAGNOSIS — I10 Essential (primary) hypertension: Secondary | ICD-10-CM | POA: Diagnosis not present

## 2013-12-09 DIAGNOSIS — M1990 Unspecified osteoarthritis, unspecified site: Secondary | ICD-10-CM | POA: Diagnosis not present

## 2013-12-09 DIAGNOSIS — I10 Essential (primary) hypertension: Secondary | ICD-10-CM | POA: Diagnosis not present

## 2013-12-09 DIAGNOSIS — R269 Unspecified abnormalities of gait and mobility: Secondary | ICD-10-CM | POA: Diagnosis not present

## 2013-12-09 DIAGNOSIS — Z471 Aftercare following joint replacement surgery: Secondary | ICD-10-CM | POA: Diagnosis not present

## 2013-12-09 DIAGNOSIS — G20 Parkinson's disease: Secondary | ICD-10-CM | POA: Diagnosis not present

## 2013-12-09 DIAGNOSIS — F0390 Unspecified dementia without behavioral disturbance: Secondary | ICD-10-CM | POA: Diagnosis not present

## 2013-12-14 DIAGNOSIS — Z471 Aftercare following joint replacement surgery: Secondary | ICD-10-CM | POA: Diagnosis not present

## 2013-12-14 DIAGNOSIS — R269 Unspecified abnormalities of gait and mobility: Secondary | ICD-10-CM | POA: Diagnosis not present

## 2013-12-14 DIAGNOSIS — M1990 Unspecified osteoarthritis, unspecified site: Secondary | ICD-10-CM | POA: Diagnosis not present

## 2013-12-14 DIAGNOSIS — F0390 Unspecified dementia without behavioral disturbance: Secondary | ICD-10-CM | POA: Diagnosis not present

## 2013-12-14 DIAGNOSIS — G20 Parkinson's disease: Secondary | ICD-10-CM | POA: Diagnosis not present

## 2013-12-14 DIAGNOSIS — I10 Essential (primary) hypertension: Secondary | ICD-10-CM | POA: Diagnosis not present

## 2013-12-15 ENCOUNTER — Other Ambulatory Visit: Payer: Self-pay | Admitting: *Deleted

## 2013-12-15 DIAGNOSIS — G20 Parkinson's disease: Secondary | ICD-10-CM | POA: Diagnosis not present

## 2013-12-15 DIAGNOSIS — B372 Candidiasis of skin and nail: Secondary | ICD-10-CM

## 2013-12-15 DIAGNOSIS — R269 Unspecified abnormalities of gait and mobility: Secondary | ICD-10-CM | POA: Diagnosis not present

## 2013-12-15 DIAGNOSIS — M1990 Unspecified osteoarthritis, unspecified site: Secondary | ICD-10-CM | POA: Diagnosis not present

## 2013-12-15 DIAGNOSIS — F0390 Unspecified dementia without behavioral disturbance: Secondary | ICD-10-CM | POA: Diagnosis not present

## 2013-12-15 DIAGNOSIS — Z471 Aftercare following joint replacement surgery: Secondary | ICD-10-CM | POA: Diagnosis not present

## 2013-12-15 DIAGNOSIS — I10 Essential (primary) hypertension: Secondary | ICD-10-CM | POA: Diagnosis not present

## 2013-12-15 MED ORDER — MIRABEGRON ER 50 MG PO TB24: 50.0000 mg | ORAL_TABLET | Freq: Every day | ORAL | Status: DC

## 2013-12-15 MED ORDER — NYSTATIN 100000 UNIT/GM EX POWD: CUTANEOUS | Status: DC

## 2013-12-16 DIAGNOSIS — R269 Unspecified abnormalities of gait and mobility: Secondary | ICD-10-CM | POA: Diagnosis not present

## 2013-12-16 DIAGNOSIS — M1990 Unspecified osteoarthritis, unspecified site: Secondary | ICD-10-CM | POA: Diagnosis not present

## 2013-12-16 DIAGNOSIS — Z471 Aftercare following joint replacement surgery: Secondary | ICD-10-CM | POA: Diagnosis not present

## 2013-12-16 DIAGNOSIS — G20 Parkinson's disease: Secondary | ICD-10-CM | POA: Diagnosis not present

## 2013-12-16 DIAGNOSIS — I10 Essential (primary) hypertension: Secondary | ICD-10-CM | POA: Diagnosis not present

## 2013-12-16 DIAGNOSIS — F0390 Unspecified dementia without behavioral disturbance: Secondary | ICD-10-CM | POA: Diagnosis not present

## 2013-12-19 DIAGNOSIS — M1990 Unspecified osteoarthritis, unspecified site: Secondary | ICD-10-CM | POA: Diagnosis not present

## 2013-12-19 DIAGNOSIS — G20 Parkinson's disease: Secondary | ICD-10-CM | POA: Diagnosis not present

## 2013-12-19 DIAGNOSIS — Z471 Aftercare following joint replacement surgery: Secondary | ICD-10-CM | POA: Diagnosis not present

## 2013-12-19 DIAGNOSIS — R269 Unspecified abnormalities of gait and mobility: Secondary | ICD-10-CM | POA: Diagnosis not present

## 2013-12-19 DIAGNOSIS — I10 Essential (primary) hypertension: Secondary | ICD-10-CM | POA: Diagnosis not present

## 2013-12-19 DIAGNOSIS — F0390 Unspecified dementia without behavioral disturbance: Secondary | ICD-10-CM | POA: Diagnosis not present

## 2013-12-20 DIAGNOSIS — R269 Unspecified abnormalities of gait and mobility: Secondary | ICD-10-CM | POA: Diagnosis not present

## 2013-12-20 DIAGNOSIS — M1990 Unspecified osteoarthritis, unspecified site: Secondary | ICD-10-CM | POA: Diagnosis not present

## 2013-12-20 DIAGNOSIS — S72023B Displaced fracture of epiphysis (separation) (upper) of unspecified femur, initial encounter for open fracture type I or II: Secondary | ICD-10-CM | POA: Diagnosis not present

## 2013-12-20 DIAGNOSIS — I10 Essential (primary) hypertension: Secondary | ICD-10-CM | POA: Diagnosis not present

## 2013-12-20 DIAGNOSIS — F0390 Unspecified dementia without behavioral disturbance: Secondary | ICD-10-CM | POA: Diagnosis not present

## 2013-12-20 DIAGNOSIS — G20 Parkinson's disease: Secondary | ICD-10-CM | POA: Diagnosis not present

## 2013-12-20 DIAGNOSIS — Z96659 Presence of unspecified artificial knee joint: Secondary | ICD-10-CM | POA: Diagnosis not present

## 2013-12-20 DIAGNOSIS — Z471 Aftercare following joint replacement surgery: Secondary | ICD-10-CM | POA: Diagnosis not present

## 2013-12-21 DIAGNOSIS — Z471 Aftercare following joint replacement surgery: Secondary | ICD-10-CM | POA: Diagnosis not present

## 2013-12-21 DIAGNOSIS — R269 Unspecified abnormalities of gait and mobility: Secondary | ICD-10-CM | POA: Diagnosis not present

## 2013-12-21 DIAGNOSIS — I10 Essential (primary) hypertension: Secondary | ICD-10-CM | POA: Diagnosis not present

## 2013-12-21 DIAGNOSIS — M1990 Unspecified osteoarthritis, unspecified site: Secondary | ICD-10-CM | POA: Diagnosis not present

## 2013-12-21 DIAGNOSIS — G20 Parkinson's disease: Secondary | ICD-10-CM | POA: Diagnosis not present

## 2013-12-21 DIAGNOSIS — F0390 Unspecified dementia without behavioral disturbance: Secondary | ICD-10-CM | POA: Diagnosis not present

## 2013-12-22 DIAGNOSIS — M1990 Unspecified osteoarthritis, unspecified site: Secondary | ICD-10-CM | POA: Diagnosis not present

## 2013-12-22 DIAGNOSIS — G20 Parkinson's disease: Secondary | ICD-10-CM | POA: Diagnosis not present

## 2013-12-22 DIAGNOSIS — Z471 Aftercare following joint replacement surgery: Secondary | ICD-10-CM | POA: Diagnosis not present

## 2013-12-22 DIAGNOSIS — I10 Essential (primary) hypertension: Secondary | ICD-10-CM | POA: Diagnosis not present

## 2013-12-22 DIAGNOSIS — R269 Unspecified abnormalities of gait and mobility: Secondary | ICD-10-CM | POA: Diagnosis not present

## 2013-12-22 DIAGNOSIS — F0390 Unspecified dementia without behavioral disturbance: Secondary | ICD-10-CM | POA: Diagnosis not present

## 2013-12-23 DIAGNOSIS — G20 Parkinson's disease: Secondary | ICD-10-CM | POA: Diagnosis not present

## 2013-12-23 DIAGNOSIS — F0390 Unspecified dementia without behavioral disturbance: Secondary | ICD-10-CM | POA: Diagnosis not present

## 2013-12-23 DIAGNOSIS — Z471 Aftercare following joint replacement surgery: Secondary | ICD-10-CM | POA: Diagnosis not present

## 2013-12-23 DIAGNOSIS — I10 Essential (primary) hypertension: Secondary | ICD-10-CM | POA: Diagnosis not present

## 2013-12-23 DIAGNOSIS — R269 Unspecified abnormalities of gait and mobility: Secondary | ICD-10-CM | POA: Diagnosis not present

## 2013-12-23 DIAGNOSIS — M1990 Unspecified osteoarthritis, unspecified site: Secondary | ICD-10-CM | POA: Diagnosis not present

## 2013-12-26 DIAGNOSIS — M1990 Unspecified osteoarthritis, unspecified site: Secondary | ICD-10-CM | POA: Diagnosis not present

## 2013-12-26 DIAGNOSIS — I10 Essential (primary) hypertension: Secondary | ICD-10-CM | POA: Diagnosis not present

## 2013-12-26 DIAGNOSIS — F0390 Unspecified dementia without behavioral disturbance: Secondary | ICD-10-CM | POA: Diagnosis not present

## 2013-12-26 DIAGNOSIS — R269 Unspecified abnormalities of gait and mobility: Secondary | ICD-10-CM | POA: Diagnosis not present

## 2013-12-26 DIAGNOSIS — G20 Parkinson's disease: Secondary | ICD-10-CM | POA: Diagnosis not present

## 2013-12-26 DIAGNOSIS — Z471 Aftercare following joint replacement surgery: Secondary | ICD-10-CM | POA: Diagnosis not present

## 2013-12-29 DIAGNOSIS — I10 Essential (primary) hypertension: Secondary | ICD-10-CM | POA: Diagnosis not present

## 2013-12-29 DIAGNOSIS — F0390 Unspecified dementia without behavioral disturbance: Secondary | ICD-10-CM | POA: Diagnosis not present

## 2013-12-29 DIAGNOSIS — Z471 Aftercare following joint replacement surgery: Secondary | ICD-10-CM | POA: Diagnosis not present

## 2013-12-29 DIAGNOSIS — M1990 Unspecified osteoarthritis, unspecified site: Secondary | ICD-10-CM | POA: Diagnosis not present

## 2013-12-29 DIAGNOSIS — G20 Parkinson's disease: Secondary | ICD-10-CM | POA: Diagnosis not present

## 2013-12-29 DIAGNOSIS — R269 Unspecified abnormalities of gait and mobility: Secondary | ICD-10-CM | POA: Diagnosis not present

## 2013-12-30 DIAGNOSIS — M1990 Unspecified osteoarthritis, unspecified site: Secondary | ICD-10-CM | POA: Diagnosis not present

## 2013-12-30 DIAGNOSIS — G20 Parkinson's disease: Secondary | ICD-10-CM | POA: Diagnosis not present

## 2013-12-30 DIAGNOSIS — F0390 Unspecified dementia without behavioral disturbance: Secondary | ICD-10-CM | POA: Diagnosis not present

## 2013-12-30 DIAGNOSIS — R269 Unspecified abnormalities of gait and mobility: Secondary | ICD-10-CM | POA: Diagnosis not present

## 2013-12-30 DIAGNOSIS — Z471 Aftercare following joint replacement surgery: Secondary | ICD-10-CM | POA: Diagnosis not present

## 2013-12-30 DIAGNOSIS — I10 Essential (primary) hypertension: Secondary | ICD-10-CM | POA: Diagnosis not present

## 2014-01-02 DIAGNOSIS — I10 Essential (primary) hypertension: Secondary | ICD-10-CM | POA: Diagnosis not present

## 2014-01-02 DIAGNOSIS — G20 Parkinson's disease: Secondary | ICD-10-CM | POA: Diagnosis not present

## 2014-01-02 DIAGNOSIS — R269 Unspecified abnormalities of gait and mobility: Secondary | ICD-10-CM | POA: Diagnosis not present

## 2014-01-02 DIAGNOSIS — M1990 Unspecified osteoarthritis, unspecified site: Secondary | ICD-10-CM | POA: Diagnosis not present

## 2014-01-02 DIAGNOSIS — Z471 Aftercare following joint replacement surgery: Secondary | ICD-10-CM | POA: Diagnosis not present

## 2014-01-02 DIAGNOSIS — F0390 Unspecified dementia without behavioral disturbance: Secondary | ICD-10-CM | POA: Diagnosis not present

## 2014-01-04 DIAGNOSIS — Z471 Aftercare following joint replacement surgery: Secondary | ICD-10-CM | POA: Diagnosis not present

## 2014-01-04 DIAGNOSIS — R269 Unspecified abnormalities of gait and mobility: Secondary | ICD-10-CM | POA: Diagnosis not present

## 2014-01-04 DIAGNOSIS — F0390 Unspecified dementia without behavioral disturbance: Secondary | ICD-10-CM | POA: Diagnosis not present

## 2014-01-04 DIAGNOSIS — M1990 Unspecified osteoarthritis, unspecified site: Secondary | ICD-10-CM | POA: Diagnosis not present

## 2014-01-04 DIAGNOSIS — G20 Parkinson's disease: Secondary | ICD-10-CM | POA: Diagnosis not present

## 2014-01-04 DIAGNOSIS — I10 Essential (primary) hypertension: Secondary | ICD-10-CM | POA: Diagnosis not present

## 2014-01-05 DIAGNOSIS — M1990 Unspecified osteoarthritis, unspecified site: Secondary | ICD-10-CM | POA: Diagnosis not present

## 2014-01-05 DIAGNOSIS — I10 Essential (primary) hypertension: Secondary | ICD-10-CM | POA: Diagnosis not present

## 2014-01-05 DIAGNOSIS — F0280 Dementia in other diseases classified elsewhere without behavioral disturbance: Secondary | ICD-10-CM | POA: Diagnosis not present

## 2014-01-05 DIAGNOSIS — G20 Parkinson's disease: Secondary | ICD-10-CM | POA: Diagnosis not present

## 2014-01-05 DIAGNOSIS — Z96649 Presence of unspecified artificial hip joint: Secondary | ICD-10-CM | POA: Diagnosis not present

## 2014-01-05 DIAGNOSIS — Z9181 History of falling: Secondary | ICD-10-CM | POA: Diagnosis not present

## 2014-01-05 DIAGNOSIS — M810 Age-related osteoporosis without current pathological fracture: Secondary | ICD-10-CM | POA: Diagnosis not present

## 2014-01-05 DIAGNOSIS — Z471 Aftercare following joint replacement surgery: Secondary | ICD-10-CM | POA: Diagnosis not present

## 2014-01-09 DIAGNOSIS — F0280 Dementia in other diseases classified elsewhere without behavioral disturbance: Secondary | ICD-10-CM | POA: Diagnosis not present

## 2014-01-09 DIAGNOSIS — G20 Parkinson's disease: Secondary | ICD-10-CM | POA: Diagnosis not present

## 2014-01-09 DIAGNOSIS — I10 Essential (primary) hypertension: Secondary | ICD-10-CM | POA: Diagnosis not present

## 2014-01-09 DIAGNOSIS — M1990 Unspecified osteoarthritis, unspecified site: Secondary | ICD-10-CM | POA: Diagnosis not present

## 2014-01-09 DIAGNOSIS — Z471 Aftercare following joint replacement surgery: Secondary | ICD-10-CM | POA: Diagnosis not present

## 2014-01-09 DIAGNOSIS — M810 Age-related osteoporosis without current pathological fracture: Secondary | ICD-10-CM | POA: Diagnosis not present

## 2014-01-12 DIAGNOSIS — Z471 Aftercare following joint replacement surgery: Secondary | ICD-10-CM | POA: Diagnosis not present

## 2014-01-12 DIAGNOSIS — M1990 Unspecified osteoarthritis, unspecified site: Secondary | ICD-10-CM | POA: Diagnosis not present

## 2014-01-12 DIAGNOSIS — G20 Parkinson's disease: Secondary | ICD-10-CM | POA: Diagnosis not present

## 2014-01-12 DIAGNOSIS — I10 Essential (primary) hypertension: Secondary | ICD-10-CM | POA: Diagnosis not present

## 2014-01-12 DIAGNOSIS — M810 Age-related osteoporosis without current pathological fracture: Secondary | ICD-10-CM | POA: Diagnosis not present

## 2014-01-12 DIAGNOSIS — F0280 Dementia in other diseases classified elsewhere without behavioral disturbance: Secondary | ICD-10-CM | POA: Diagnosis not present

## 2014-01-16 DIAGNOSIS — M810 Age-related osteoporosis without current pathological fracture: Secondary | ICD-10-CM | POA: Diagnosis not present

## 2014-01-16 DIAGNOSIS — Z471 Aftercare following joint replacement surgery: Secondary | ICD-10-CM | POA: Diagnosis not present

## 2014-01-16 DIAGNOSIS — I10 Essential (primary) hypertension: Secondary | ICD-10-CM | POA: Diagnosis not present

## 2014-01-16 DIAGNOSIS — M1990 Unspecified osteoarthritis, unspecified site: Secondary | ICD-10-CM | POA: Diagnosis not present

## 2014-01-16 DIAGNOSIS — F0280 Dementia in other diseases classified elsewhere without behavioral disturbance: Secondary | ICD-10-CM | POA: Diagnosis not present

## 2014-01-16 DIAGNOSIS — G20 Parkinson's disease: Secondary | ICD-10-CM | POA: Diagnosis not present

## 2014-01-18 ENCOUNTER — Other Ambulatory Visit: Payer: Self-pay | Admitting: *Deleted

## 2014-01-18 DIAGNOSIS — M810 Age-related osteoporosis without current pathological fracture: Secondary | ICD-10-CM | POA: Diagnosis not present

## 2014-01-18 DIAGNOSIS — G20 Parkinson's disease: Secondary | ICD-10-CM | POA: Diagnosis not present

## 2014-01-18 DIAGNOSIS — F0280 Dementia in other diseases classified elsewhere without behavioral disturbance: Secondary | ICD-10-CM | POA: Diagnosis not present

## 2014-01-18 DIAGNOSIS — Z471 Aftercare following joint replacement surgery: Secondary | ICD-10-CM | POA: Diagnosis not present

## 2014-01-18 DIAGNOSIS — M1990 Unspecified osteoarthritis, unspecified site: Secondary | ICD-10-CM | POA: Diagnosis not present

## 2014-01-18 DIAGNOSIS — I10 Essential (primary) hypertension: Secondary | ICD-10-CM | POA: Diagnosis not present

## 2014-01-18 MED ORDER — OMEPRAZOLE 20 MG PO CPDR: DELAYED_RELEASE_CAPSULE | ORAL | Status: DC

## 2014-01-18 NOTE — Telephone Encounter (Signed)
Holly Grimes °

## 2014-01-19 ENCOUNTER — Ambulatory Visit (INDEPENDENT_AMBULATORY_CARE_PROVIDER_SITE_OTHER): Payer: Medicare Other | Admitting: Internal Medicine

## 2014-01-19 ENCOUNTER — Encounter: Payer: Self-pay | Admitting: Internal Medicine

## 2014-01-19 VITALS — BP 136/78 | HR 72 | Resp 10 | Wt 179.0 lb

## 2014-01-19 DIAGNOSIS — G3183 Dementia with Lewy bodies: Secondary | ICD-10-CM

## 2014-01-19 DIAGNOSIS — Z96649 Presence of unspecified artificial hip joint: Secondary | ICD-10-CM

## 2014-01-19 DIAGNOSIS — F32A Depression, unspecified: Secondary | ICD-10-CM

## 2014-01-19 DIAGNOSIS — F0280 Dementia in other diseases classified elsewhere without behavioral disturbance: Secondary | ICD-10-CM | POA: Diagnosis not present

## 2014-01-19 DIAGNOSIS — G20 Parkinson's disease: Secondary | ICD-10-CM

## 2014-01-19 DIAGNOSIS — F329 Major depressive disorder, single episode, unspecified: Secondary | ICD-10-CM

## 2014-01-19 DIAGNOSIS — G20A1 Parkinson's disease without dyskinesia, without mention of fluctuations: Secondary | ICD-10-CM | POA: Diagnosis not present

## 2014-01-19 DIAGNOSIS — F3289 Other specified depressive episodes: Secondary | ICD-10-CM

## 2014-01-19 DIAGNOSIS — S72009A Fracture of unspecified part of neck of unspecified femur, initial encounter for closed fracture: Secondary | ICD-10-CM

## 2014-01-19 DIAGNOSIS — N3941 Urge incontinence: Secondary | ICD-10-CM

## 2014-01-19 DIAGNOSIS — K5900 Constipation, unspecified: Secondary | ICD-10-CM | POA: Diagnosis not present

## 2014-01-19 DIAGNOSIS — K219 Gastro-esophageal reflux disease without esophagitis: Secondary | ICD-10-CM

## 2014-01-19 DIAGNOSIS — E785 Hyperlipidemia, unspecified: Secondary | ICD-10-CM

## 2014-01-19 MED ORDER — MEMANTINE HCL ER 28 MG PO CP24: 28.0000 mg | ORAL_CAPSULE | Freq: Every day | ORAL | Status: DC

## 2014-01-19 MED ORDER — ROSUVASTATIN CALCIUM 10 MG PO TABS: 10.0000 mg | ORAL_TABLET | Freq: Every day | ORAL | Status: DC

## 2014-01-19 MED ORDER — OMEPRAZOLE 20 MG PO CPDR: DELAYED_RELEASE_CAPSULE | ORAL | Status: DC

## 2014-01-19 MED ORDER — CLOPIDOGREL BISULFATE 75 MG PO TABS: ORAL_TABLET | ORAL | Status: DC

## 2014-01-19 MED ORDER — OMEGA-3-ACID ETHYL ESTERS 1 G PO CAPS: 2.0000 g | ORAL_CAPSULE | Freq: Two times a day (BID) | ORAL | Status: DC

## 2014-01-19 NOTE — Progress Notes (Signed)
Patient ID: Holly SchwalbeJoan M Overbay, female   DOB: [birth year], 80 y.o.   MRN: [id number]   Location:  [facility]  Code Status: DNR, has living will and hcpoa  Allergies  Allergen Reactions  . Cephalexin Nausea And Vomiting  . Sulfa Antibiotics Other (See Comments)    Daughter unsure of reaction, but believes it is severe  . Sulfites     Chief Complaint  Patient presents with  . Medical Managment of Chronic Issues    4 month follow-up   . Allergic Rhinitis     Discuss allergy medicaiton    HPI: Patient is a 80 y.o. white female seen in the office today for f/u of chronic conditions.  I have not seen her myself since her fall and hip fx and [facility] rehab stay.  Had put her daughter's sneakers on and was going to [place] in her nightgown, didn't make it out the door to go due to fall.  Had hip replacement surgery done by Dr. Lequita HaltAluisio.  Is walking around house with walker.  [facility].  Using transport chair when out.  Continued with home health PT, OT.  Has had a couple of falls since her surgery.  Will be able to follow up with him if needed.  Doesn't remember that she fell and c/o hip pain sometimes.  One of falls happened when she wouldn't let caregiver help her to the bathroom.  Has also fallen out of bed in her sleep a few times.  Doing pretty well with drinking her water.    Her daughter got her a hospital bed that can be lowered.  Does have carpet in bedroom.    Still has trouble sleeping--sometimes has incontinent episodes.  Planning to try going to day program now, but still getting home health visits--just PT 2x per week.    Had bronchitis at rehab.  Nebs helped.  Had antibiotics also.  Did eventually get neb machine.  Now having sneezing, stuffy nose.  Going to use " sav" which is like natural vicks.    Had one infected stitch that required abx before she came home from therapy, also.    They were using colace  prn, but moved to one daily.  Daughter adjusting as needed.  Not using narcotics, only tylenol.  Reviewed genetic testing--mentions plavix not the best anticoagulant for her, but she has done well with it.  Celexa also noted not expected to be effective, but has been working well for her.    Review of Systems:  Review of Systems  Constitutional: Positive for weight loss and malaise/fatigue. Negative for fever.  HENT: Negative for congestion.   Eyes: Negative for blurred vision.  Respiratory: Negative for cough and shortness of breath.   Cardiovascular: Negative for chest pain and leg swelling.  Gastrointestinal: Negative for abdominal pain.  Genitourinary: Negative for dysuria and frequency.  Musculoskeletal: Positive for falls and joint pain. Negative for myalgias.       Right hip  Skin: Negative for rash.  Neurological: Positive for weakness. Negative for dizziness and loss of consciousness.  Endo/Heme/Allergies: Bruises/bleeds easily.  Psychiatric/Behavioral: Positive for depression and memory loss. The patient does not have insomnia.     Past Medical History  Diagnosis Date  . Hypertension   . High cholesterol   . Mitral valve prolapse   . Dementia   . Parkinson's disease, Lewy body   . Unspecified constipation   . Onychia and  paronychia of toe   . Spasm of muscle   . Abnormality of gait   . Orthostatic hypotension   . Major depressive disorder, single episode, unspecified   . Diaphragmatic hernia without mention of obstruction or gangrene   . Alzheimer's disease   . Osteoarthrosis, unspecified whether generalized or localized, unspecified site   . Osteoporosis, unspecified   . Unspecified constipation   . Sprain of ribs   . Delirium due to conditions classified elsewhere   . Diarrhea   . Edema   . Pain in joint, pelvic region and thigh   . Other malaise and fatigue   . Urinary frequency [id number]  . Personal history of fall   . Female stress incontinence   . Other  and unspecified hyperlipidemia   . Obesity, unspecified   . Osteoarthrosis, unspecified whether generalized or localized, unspecified site   . Pain in joint, shoulder region   . Transient ischemic attack (TIA), and cerebral infarction without residual deficits(V12.54)     Past Surgical History  Procedure Laterality Date  . Joint replacement    . Tonsillectomy    . Ankle surgery Left 1998  . Total knee arthroplasty Right 2002  . Total knee arthroplasty Left 2005  . Hip arthroplasty Right 09/24/2013    Procedure: ARTHROPLASTY BIPOLAR HIP;  Surgeon: Loanne Drilling, MD;  Location: [facility];  Service: Orthopedics;  Laterality: Right;    Social History:   reports that she has never smoked. She does not have any smokeless tobacco history on file. She reports that she does not drink alcohol or use illicit drugs.  Family History  Problem Relation Age of Onset  . Adopted: Yes    Medications: Patient's Medications  New Prescriptions   No medications on file  Previous Medications   ACETAMINOPHEN (TYLENOL) 650 MG CR TABLET    Take 650 mg by mouth. 2 by mouth twice daily   ALBUTEROL (ACCUNEB) 1.25 MG/3ML NEBULIZER SOLUTION    Take 1 ampule by nebulization every 6 (six) hours as needed for wheezing.   ASCORBIC ACID (VITAMIN C WITH ROSE HIPS) 1000 MG TABLET    Take 1,000 mg by mouth daily.     CALCIUM CITRATE PO    Take 1,200 mg by mouth 2 (two) times daily. Also includes 1000 iu Vit D3, 80 mg magnesium, 5 mg sodium   CHOLECALCIFEROL (VITAMIN D3) 2000 UNITS TABS    Take 2,000 Units by mouth daily.    CITALOPRAM (CELEXA) 40 MG TABLET    Take 40 mg by mouth. 1/2 by mouth daily   CLOPIDOGREL (PLAVIX) 75 MG TABLET    Take one tablet by mouth once daily to help circulation   DOCUSATE SODIUM (COLACE) 100 MG CAPSULE    Take 100 mg by mouth daily.    FLUTICASONE (CUTIVATE) 0.05 % CREAM    Apply 1 application topically 2 (two) times daily. To affected areas   FOLIC ACID (FOLVITE) 400 MCG TABLET    Take  400 mcg by mouth daily.   GUAIFENESIN (MUCINEX MAXIMUM STRENGTH) 1200 MG TB12    Take by mouth 2 (two) times daily.   HALOPERIDOL (HALDOL) 0.5 MG TABLET    Take 0.5 mg by mouth every 6 (six) hours as needed (For Psychosis and agitation).    MEMANTINE HCL ER (NAMENDA XR) 28 MG CP24    Take 28 mg by mouth at bedtime. Take one tablet once a day to preserve memory   MIRABEGRON ER (MYRBETRIQ) 50 MG TB24 TABLET  Take 1 tablet (50 mg total) by mouth daily.   MULTIPLE VITAMINS-MINERALS (CENTRUM SILVER ULTRA WOMENS PO)    Take 1 tablet by mouth every morning.     NYSTATIN (MYCOSTATIN/NYSTOP) 100000 UNIT/GM POWD    2 times a day for 2 weeks in LT auxillia   OMEGA-3 ACID ETHYL ESTERS (LOVAZA) 1 G CAPSULE    Take 2 g by mouth 2 (two) times daily.     OMEPRAZOLE (PRILOSEC) 20 MG CAPSULE    Take one capsule by mouth every day to reduce stomach acid secretion   PROBIOTIC PRODUCT (ALIGN) 4 MG CAPS    Take 1 capsule (4 mg total) by mouth daily.   RIVASTIGMINE (EXELON) 4.6 MG/24HR    Place 1 patch onto the skin daily.    ROSUVASTATIN (CRESTOR) 10 MG TABLET    Take 1 tablet (10 mg total) by mouth at bedtime.   WHEAT DEXTRIN (BENEFIBER) POWD    Take by mouth as needed.  Modified Medications   No medications on file  Discontinued Medications   ACETAMINOPHEN (TYLENOL) 325 MG TABLET    Take 2 tablets (650 mg total) by mouth every 6 (six) hours as needed for mild pain or fever.   CIPROFLOXACIN (CILOXAN) 0.3 % OPHTHALMIC SOLUTION    Place 2 drops into both eyes every 2 (two) hours. Administer 1 drop, every 2 hours, while awake, for 2 days. Then 1 drop, every 4 hours, while awake, for the next 5 days.   GUAIFENESIN (MUCINEX) 600 MG 12 HR TABLET    Take by mouth 2 (two) times daily.     Physical Exam: Filed Vitals:   01/19/14 1423  BP: 136/78  Pulse: 72  Resp: 10  Weight: 179 lb (81.194 kg)  SpO2: 97%  Physical Exam  Constitutional: She appears well-developed and well-nourished. No distress.  Obese white  female, very pleasant, seated in chair  Cardiovascular: Normal rate, regular rhythm, normal heart sounds and intact distal pulses.   Pulmonary/Chest: Effort normal and breath sounds normal. No respiratory distress.  Abdominal: Soft. Bowel sounds are normal. She exhibits no distension and no mass. There is no tenderness.  Musculoskeletal: Normal range of motion.  Mild tenderness of right thigh lateral aspect distal to healed incision site  Neurological: She is alert. No cranial nerve deficit.  Oriented to person only  Skin: Skin is warm and dry. There is pallor.  Psychiatric: She has a normal mood and affect.    Labs reviewed: Basic Metabolic Panel:  Recent Labs  16/07/9611/21/14 0516 09/26/13 0506 11/30/13 1544  NA 139 135 140  K 3.8 3.9 4.3  CL 106 101 101  CO2 26 26 24   GLUCOSE 132* 91 107*  BUN 19 12 14   CREATININE 0.63 0.73 0.79  CALCIUM 8.6 8.4 9.1  CBC:  Recent Labs  09/26/13 0506 09/27/13 0506 11/30/13 1544  WBC 8.2 6.7 6.5  NEUTROABS  --   --  3.3  HGB 9.0* 9.3* 11.9  HCT 27.4* 27.6* 36.3  MCV 100.4* 100.0 94  PLT 145* 148* 320    Assessment/Plan 1. Parkinson's disease, Lewy body -cont current meds -reviewed genetic tests with her and her daughter and we decided to cont plavix due to stability with this though it suggested alternatives like eliquis might be better for her - Memantine HCl ER (NAMENDA XR) 28 MG CP24; Take 28 mg by mouth at bedtime. Take one tablet once a day to preserve memory  Dispense: 90 capsule; Refill: 1 - clopidogrel (PLAVIX) 75  MG tablet; Take one tablet by mouth once daily to help circulation  Dispense: 90 tablet; Refill: 1  2. Hip fracture -seems she has healed and is doing well -her daughter notes she still c/o some pain and she says more lately, but pt w/o any complaints during visit  3. Hip joint replacement status -by Dr. Lequita Halt -doing well even after a bad fall soon after return home from rehab  4. Constipation -cont colace as  her daughter is using it--adjusting as she needs  5. Depression -mood is good, no changes needed--cannot take remeron as she is already drowsy much of the time, tolerates celexa well so will not change  6. Urge incontinence -doing wonderfully with myrbetriq from this standpoint - CBC With differential/Platelet - Basic metabolic panel  7. Hyperlipidemia LDL goal < 100 -cont  rosuvastatin (CRESTOR) 10 MG tablet; Take 1 tablet (10 mg total) by mouth at bedtime.  Dispense: 90 tablet; Refill: 1 - omega-3 acid ethyl esters (LOVAZA) 1 G capsule; Take 2 capsules (2 g total) by mouth 2 (two) times daily.  Dispense: 360 capsule; Refill: 1  8. GERD (gastroesophageal reflux disease) -stable with prilosec - omeprazole (PRILOSEC) 20 MG capsule; Take one capsule by mouth every day to reduce stomach acid secretion  Dispense: 90 capsule; Refill: 3  Labs/tests ordered: Orders Placed This Encounter  Procedures  . CBC With differential/Platelet  . Basic metabolic panel    Next appt:   3mos

## 2014-01-20 ENCOUNTER — Encounter: Payer: Self-pay | Admitting: *Deleted

## 2014-01-20 LAB — CBC WITH DIFFERENTIAL
Basophils Absolute: 0 10*3/uL (ref 0.0–0.2)
Basos: 1 %
Eos: 5 %
Eosinophils Absolute: 0.3 10*3/uL (ref 0.0–0.4)
HCT: 38.9 % (ref 34.0–46.6)
Hemoglobin: 12.7 g/dL (ref 11.1–15.9)
Immature Grans (Abs): 0 10*3/uL (ref 0.0–0.1)
Immature Granulocytes: 0 %
Lymphocytes Absolute: 1.5 10*3/uL (ref 0.7–3.1)
Lymphs: 30 %
MCH: 31 pg (ref 26.6–33.0)
MCHC: 32.6 g/dL (ref 31.5–35.7)
MCV: 95 fL (ref 79–97)
Monocytes Absolute: 0.5 10*3/uL (ref 0.1–0.9)
Monocytes: 9 %
Neutrophils Absolute: 2.7 10*3/uL (ref 1.4–7.0)
Neutrophils Relative %: 55 %
Platelets: 209 10*3/uL (ref 150–379)
RBC: 4.1 x10E6/uL (ref 3.77–5.28)
RDW: 13.7 % (ref 12.3–15.4)
WBC: 5 10*3/uL (ref 3.4–10.8)

## 2014-01-20 LAB — BASIC METABOLIC PANEL
BUN/Creatinine Ratio: 21 (ref 11–26)
BUN: 16 mg/dL (ref 8–27)
CO2: 26 mmol/L (ref 18–29)
Calcium: 9.7 mg/dL (ref 8.7–10.3)
Chloride: 98 mmol/L (ref 97–108)
Creatinine, Ser: 0.77 mg/dL (ref 0.57–1.00)
GFR calc Af Amer: 82 mL/min/{1.73_m2} (ref >59)
GFR calc non Af Amer: 71 mL/min/{1.73_m2} (ref >59)
Glucose: 92 mg/dL (ref 65–99)
Potassium: 4.2 mmol/L (ref 3.5–5.2)
Sodium: 139 mmol/L (ref 134–144)

## 2014-01-23 DIAGNOSIS — I10 Essential (primary) hypertension: Secondary | ICD-10-CM | POA: Diagnosis not present

## 2014-01-23 DIAGNOSIS — F0280 Dementia in other diseases classified elsewhere without behavioral disturbance: Secondary | ICD-10-CM | POA: Diagnosis not present

## 2014-01-23 DIAGNOSIS — M1990 Unspecified osteoarthritis, unspecified site: Secondary | ICD-10-CM | POA: Diagnosis not present

## 2014-01-23 DIAGNOSIS — G20 Parkinson's disease: Secondary | ICD-10-CM | POA: Diagnosis not present

## 2014-01-23 DIAGNOSIS — Z471 Aftercare following joint replacement surgery: Secondary | ICD-10-CM | POA: Diagnosis not present

## 2014-01-23 DIAGNOSIS — M810 Age-related osteoporosis without current pathological fracture: Secondary | ICD-10-CM | POA: Diagnosis not present

## 2014-01-27 DIAGNOSIS — G20 Parkinson's disease: Secondary | ICD-10-CM | POA: Diagnosis not present

## 2014-01-27 DIAGNOSIS — F0280 Dementia in other diseases classified elsewhere without behavioral disturbance: Secondary | ICD-10-CM | POA: Diagnosis not present

## 2014-01-27 DIAGNOSIS — Z471 Aftercare following joint replacement surgery: Secondary | ICD-10-CM | POA: Diagnosis not present

## 2014-01-27 DIAGNOSIS — M1990 Unspecified osteoarthritis, unspecified site: Secondary | ICD-10-CM | POA: Diagnosis not present

## 2014-01-27 DIAGNOSIS — M810 Age-related osteoporosis without current pathological fracture: Secondary | ICD-10-CM | POA: Diagnosis not present

## 2014-01-27 DIAGNOSIS — I10 Essential (primary) hypertension: Secondary | ICD-10-CM | POA: Diagnosis not present

## 2014-01-31 ENCOUNTER — Telehealth: Payer: Self-pay | Admitting: *Deleted

## 2014-01-31 NOTE — Telephone Encounter (Signed)
Patient daughter, Selena BattenKim, Notified.

## 2014-01-31 NOTE — Telephone Encounter (Signed)
Patient is having hip pain. Daughter thinks it is Bursitis and wants to know if it is ok to give her a NSAID like Aleve. Daughter states she is crying out at night with the pain. She wants your advice. Please Advise

## 2014-01-31 NOTE — Telephone Encounter (Signed)
She may give her aleve or ibuprofen only for a few days to help the pain and no more than a week.  Only 2 aleve per day, up to 800mg  of ibuprofen 3 times a day.  She could also apply heat to the painful area.  If the pain persists in the hip, I suggest she take her back to her surgeon.

## 2014-02-01 DIAGNOSIS — M76899 Other specified enthesopathies of unspecified lower limb, excluding foot: Secondary | ICD-10-CM | POA: Diagnosis not present

## 2014-02-01 DIAGNOSIS — Z96649 Presence of unspecified artificial hip joint: Secondary | ICD-10-CM | POA: Diagnosis not present

## 2014-02-01 DIAGNOSIS — Z4889 Encounter for other specified surgical aftercare: Secondary | ICD-10-CM | POA: Diagnosis not present

## 2014-02-03 DIAGNOSIS — Z471 Aftercare following joint replacement surgery: Secondary | ICD-10-CM | POA: Diagnosis not present

## 2014-02-03 DIAGNOSIS — F0280 Dementia in other diseases classified elsewhere without behavioral disturbance: Secondary | ICD-10-CM | POA: Diagnosis not present

## 2014-02-03 DIAGNOSIS — I10 Essential (primary) hypertension: Secondary | ICD-10-CM | POA: Diagnosis not present

## 2014-02-03 DIAGNOSIS — M810 Age-related osteoporosis without current pathological fracture: Secondary | ICD-10-CM | POA: Diagnosis not present

## 2014-02-03 DIAGNOSIS — G20 Parkinson's disease: Secondary | ICD-10-CM | POA: Diagnosis not present

## 2014-02-03 DIAGNOSIS — M1990 Unspecified osteoarthritis, unspecified site: Secondary | ICD-10-CM | POA: Diagnosis not present

## 2014-02-06 DIAGNOSIS — Z471 Aftercare following joint replacement surgery: Secondary | ICD-10-CM | POA: Diagnosis not present

## 2014-02-06 DIAGNOSIS — M1990 Unspecified osteoarthritis, unspecified site: Secondary | ICD-10-CM | POA: Diagnosis not present

## 2014-02-06 DIAGNOSIS — M810 Age-related osteoporosis without current pathological fracture: Secondary | ICD-10-CM | POA: Diagnosis not present

## 2014-02-06 DIAGNOSIS — I10 Essential (primary) hypertension: Secondary | ICD-10-CM | POA: Diagnosis not present

## 2014-02-06 DIAGNOSIS — G20 Parkinson's disease: Secondary | ICD-10-CM | POA: Diagnosis not present

## 2014-02-06 DIAGNOSIS — F0280 Dementia in other diseases classified elsewhere without behavioral disturbance: Secondary | ICD-10-CM | POA: Diagnosis not present

## 2014-02-08 DIAGNOSIS — M810 Age-related osteoporosis without current pathological fracture: Secondary | ICD-10-CM | POA: Diagnosis not present

## 2014-02-08 DIAGNOSIS — F0280 Dementia in other diseases classified elsewhere without behavioral disturbance: Secondary | ICD-10-CM | POA: Diagnosis not present

## 2014-02-08 DIAGNOSIS — I10 Essential (primary) hypertension: Secondary | ICD-10-CM | POA: Diagnosis not present

## 2014-02-08 DIAGNOSIS — G20 Parkinson's disease: Secondary | ICD-10-CM | POA: Diagnosis not present

## 2014-02-08 DIAGNOSIS — Z471 Aftercare following joint replacement surgery: Secondary | ICD-10-CM | POA: Diagnosis not present

## 2014-02-08 DIAGNOSIS — M1990 Unspecified osteoarthritis, unspecified site: Secondary | ICD-10-CM | POA: Diagnosis not present

## 2014-02-13 DIAGNOSIS — M810 Age-related osteoporosis without current pathological fracture: Secondary | ICD-10-CM | POA: Diagnosis not present

## 2014-02-13 DIAGNOSIS — G20 Parkinson's disease: Secondary | ICD-10-CM | POA: Diagnosis not present

## 2014-02-13 DIAGNOSIS — Z471 Aftercare following joint replacement surgery: Secondary | ICD-10-CM | POA: Diagnosis not present

## 2014-02-13 DIAGNOSIS — M1990 Unspecified osteoarthritis, unspecified site: Secondary | ICD-10-CM | POA: Diagnosis not present

## 2014-02-13 DIAGNOSIS — F0280 Dementia in other diseases classified elsewhere without behavioral disturbance: Secondary | ICD-10-CM | POA: Diagnosis not present

## 2014-02-13 DIAGNOSIS — I10 Essential (primary) hypertension: Secondary | ICD-10-CM | POA: Diagnosis not present

## 2014-02-16 ENCOUNTER — Encounter: Payer: Self-pay | Admitting: Internal Medicine

## 2014-02-16 DIAGNOSIS — M1990 Unspecified osteoarthritis, unspecified site: Secondary | ICD-10-CM | POA: Diagnosis not present

## 2014-02-16 DIAGNOSIS — I10 Essential (primary) hypertension: Secondary | ICD-10-CM | POA: Diagnosis not present

## 2014-02-16 DIAGNOSIS — F0280 Dementia in other diseases classified elsewhere without behavioral disturbance: Secondary | ICD-10-CM | POA: Diagnosis not present

## 2014-02-16 DIAGNOSIS — Z471 Aftercare following joint replacement surgery: Secondary | ICD-10-CM | POA: Diagnosis not present

## 2014-02-16 DIAGNOSIS — M810 Age-related osteoporosis without current pathological fracture: Secondary | ICD-10-CM | POA: Diagnosis not present

## 2014-02-16 DIAGNOSIS — G20 Parkinson's disease: Secondary | ICD-10-CM | POA: Diagnosis not present

## 2014-02-21 DIAGNOSIS — M1990 Unspecified osteoarthritis, unspecified site: Secondary | ICD-10-CM | POA: Diagnosis not present

## 2014-02-21 DIAGNOSIS — Z471 Aftercare following joint replacement surgery: Secondary | ICD-10-CM | POA: Diagnosis not present

## 2014-02-21 DIAGNOSIS — F0280 Dementia in other diseases classified elsewhere without behavioral disturbance: Secondary | ICD-10-CM | POA: Diagnosis not present

## 2014-02-21 DIAGNOSIS — I10 Essential (primary) hypertension: Secondary | ICD-10-CM | POA: Diagnosis not present

## 2014-02-21 DIAGNOSIS — M810 Age-related osteoporosis without current pathological fracture: Secondary | ICD-10-CM | POA: Diagnosis not present

## 2014-02-21 DIAGNOSIS — G20 Parkinson's disease: Secondary | ICD-10-CM | POA: Diagnosis not present

## 2014-02-23 DIAGNOSIS — Z471 Aftercare following joint replacement surgery: Secondary | ICD-10-CM | POA: Diagnosis not present

## 2014-02-23 DIAGNOSIS — F0280 Dementia in other diseases classified elsewhere without behavioral disturbance: Secondary | ICD-10-CM | POA: Diagnosis not present

## 2014-02-23 DIAGNOSIS — I10 Essential (primary) hypertension: Secondary | ICD-10-CM | POA: Diagnosis not present

## 2014-02-23 DIAGNOSIS — M1990 Unspecified osteoarthritis, unspecified site: Secondary | ICD-10-CM | POA: Diagnosis not present

## 2014-02-23 DIAGNOSIS — M810 Age-related osteoporosis without current pathological fracture: Secondary | ICD-10-CM | POA: Diagnosis not present

## 2014-02-23 DIAGNOSIS — G20 Parkinson's disease: Secondary | ICD-10-CM | POA: Diagnosis not present

## 2014-02-28 DIAGNOSIS — Z471 Aftercare following joint replacement surgery: Secondary | ICD-10-CM | POA: Diagnosis not present

## 2014-02-28 DIAGNOSIS — M810 Age-related osteoporosis without current pathological fracture: Secondary | ICD-10-CM | POA: Diagnosis not present

## 2014-02-28 DIAGNOSIS — M1990 Unspecified osteoarthritis, unspecified site: Secondary | ICD-10-CM | POA: Diagnosis not present

## 2014-02-28 DIAGNOSIS — G20 Parkinson's disease: Secondary | ICD-10-CM | POA: Diagnosis not present

## 2014-02-28 DIAGNOSIS — F0280 Dementia in other diseases classified elsewhere without behavioral disturbance: Secondary | ICD-10-CM | POA: Diagnosis not present

## 2014-02-28 DIAGNOSIS — I10 Essential (primary) hypertension: Secondary | ICD-10-CM | POA: Diagnosis not present

## 2014-03-02 DIAGNOSIS — Z4789 Encounter for other orthopedic aftercare: Secondary | ICD-10-CM | POA: Diagnosis not present

## 2014-03-02 DIAGNOSIS — Z9660 Presence of unspecified orthopedic joint implant: Secondary | ICD-10-CM | POA: Diagnosis not present

## 2014-03-03 DIAGNOSIS — I10 Essential (primary) hypertension: Secondary | ICD-10-CM | POA: Diagnosis not present

## 2014-03-03 DIAGNOSIS — M1990 Unspecified osteoarthritis, unspecified site: Secondary | ICD-10-CM | POA: Diagnosis not present

## 2014-03-03 DIAGNOSIS — Z471 Aftercare following joint replacement surgery: Secondary | ICD-10-CM | POA: Diagnosis not present

## 2014-03-03 DIAGNOSIS — F0280 Dementia in other diseases classified elsewhere without behavioral disturbance: Secondary | ICD-10-CM | POA: Diagnosis not present

## 2014-03-03 DIAGNOSIS — M810 Age-related osteoporosis without current pathological fracture: Secondary | ICD-10-CM | POA: Diagnosis not present

## 2014-03-03 DIAGNOSIS — G20 Parkinson's disease: Secondary | ICD-10-CM | POA: Diagnosis not present

## 2014-03-06 DIAGNOSIS — G3183 Dementia with Lewy bodies: Secondary | ICD-10-CM | POA: Diagnosis not present

## 2014-03-06 DIAGNOSIS — F0280 Dementia in other diseases classified elsewhere without behavioral disturbance: Secondary | ICD-10-CM | POA: Diagnosis not present

## 2014-03-06 DIAGNOSIS — I2510 Atherosclerotic heart disease of native coronary artery without angina pectoris: Secondary | ICD-10-CM | POA: Diagnosis not present

## 2014-03-06 DIAGNOSIS — R269 Unspecified abnormalities of gait and mobility: Secondary | ICD-10-CM | POA: Diagnosis not present

## 2014-03-06 DIAGNOSIS — IMO0001 Reserved for inherently not codable concepts without codable children: Secondary | ICD-10-CM | POA: Diagnosis not present

## 2014-03-06 DIAGNOSIS — I10 Essential (primary) hypertension: Secondary | ICD-10-CM | POA: Diagnosis not present

## 2014-03-06 DIAGNOSIS — M1990 Unspecified osteoarthritis, unspecified site: Secondary | ICD-10-CM | POA: Diagnosis not present

## 2014-03-06 DIAGNOSIS — Z9181 History of falling: Secondary | ICD-10-CM | POA: Diagnosis not present

## 2014-03-06 DIAGNOSIS — Z96649 Presence of unspecified artificial hip joint: Secondary | ICD-10-CM | POA: Diagnosis not present

## 2014-03-06 DIAGNOSIS — M810 Age-related osteoporosis without current pathological fracture: Secondary | ICD-10-CM | POA: Diagnosis not present

## 2014-03-07 DIAGNOSIS — G3183 Dementia with Lewy bodies: Secondary | ICD-10-CM | POA: Diagnosis not present

## 2014-03-07 DIAGNOSIS — R269 Unspecified abnormalities of gait and mobility: Secondary | ICD-10-CM | POA: Diagnosis not present

## 2014-03-07 DIAGNOSIS — I2510 Atherosclerotic heart disease of native coronary artery without angina pectoris: Secondary | ICD-10-CM | POA: Diagnosis not present

## 2014-03-07 DIAGNOSIS — F0280 Dementia in other diseases classified elsewhere without behavioral disturbance: Secondary | ICD-10-CM | POA: Diagnosis not present

## 2014-03-07 DIAGNOSIS — I10 Essential (primary) hypertension: Secondary | ICD-10-CM | POA: Diagnosis not present

## 2014-03-07 DIAGNOSIS — IMO0001 Reserved for inherently not codable concepts without codable children: Secondary | ICD-10-CM | POA: Diagnosis not present

## 2014-03-09 DIAGNOSIS — I10 Essential (primary) hypertension: Secondary | ICD-10-CM | POA: Diagnosis not present

## 2014-03-09 DIAGNOSIS — F0280 Dementia in other diseases classified elsewhere without behavioral disturbance: Secondary | ICD-10-CM | POA: Diagnosis not present

## 2014-03-09 DIAGNOSIS — IMO0001 Reserved for inherently not codable concepts without codable children: Secondary | ICD-10-CM | POA: Diagnosis not present

## 2014-03-09 DIAGNOSIS — R269 Unspecified abnormalities of gait and mobility: Secondary | ICD-10-CM | POA: Diagnosis not present

## 2014-03-09 DIAGNOSIS — G3183 Dementia with Lewy bodies: Secondary | ICD-10-CM | POA: Diagnosis not present

## 2014-03-09 DIAGNOSIS — I2510 Atherosclerotic heart disease of native coronary artery without angina pectoris: Secondary | ICD-10-CM | POA: Diagnosis not present

## 2014-03-13 DIAGNOSIS — R269 Unspecified abnormalities of gait and mobility: Secondary | ICD-10-CM | POA: Diagnosis not present

## 2014-03-13 DIAGNOSIS — G3183 Dementia with Lewy bodies: Secondary | ICD-10-CM | POA: Diagnosis not present

## 2014-03-13 DIAGNOSIS — IMO0001 Reserved for inherently not codable concepts without codable children: Secondary | ICD-10-CM | POA: Diagnosis not present

## 2014-03-13 DIAGNOSIS — I2510 Atherosclerotic heart disease of native coronary artery without angina pectoris: Secondary | ICD-10-CM | POA: Diagnosis not present

## 2014-03-13 DIAGNOSIS — F0280 Dementia in other diseases classified elsewhere without behavioral disturbance: Secondary | ICD-10-CM | POA: Diagnosis not present

## 2014-03-13 DIAGNOSIS — I10 Essential (primary) hypertension: Secondary | ICD-10-CM | POA: Diagnosis not present

## 2014-03-20 DIAGNOSIS — IMO0001 Reserved for inherently not codable concepts without codable children: Secondary | ICD-10-CM | POA: Diagnosis not present

## 2014-03-20 DIAGNOSIS — R269 Unspecified abnormalities of gait and mobility: Secondary | ICD-10-CM | POA: Diagnosis not present

## 2014-03-20 DIAGNOSIS — I10 Essential (primary) hypertension: Secondary | ICD-10-CM | POA: Diagnosis not present

## 2014-03-20 DIAGNOSIS — F0280 Dementia in other diseases classified elsewhere without behavioral disturbance: Secondary | ICD-10-CM | POA: Diagnosis not present

## 2014-03-20 DIAGNOSIS — I2510 Atherosclerotic heart disease of native coronary artery without angina pectoris: Secondary | ICD-10-CM | POA: Diagnosis not present

## 2014-03-24 DIAGNOSIS — I10 Essential (primary) hypertension: Secondary | ICD-10-CM | POA: Diagnosis not present

## 2014-03-24 DIAGNOSIS — I2510 Atherosclerotic heart disease of native coronary artery without angina pectoris: Secondary | ICD-10-CM | POA: Diagnosis not present

## 2014-03-24 DIAGNOSIS — F0280 Dementia in other diseases classified elsewhere without behavioral disturbance: Secondary | ICD-10-CM | POA: Diagnosis not present

## 2014-03-24 DIAGNOSIS — R269 Unspecified abnormalities of gait and mobility: Secondary | ICD-10-CM | POA: Diagnosis not present

## 2014-03-24 DIAGNOSIS — G3183 Dementia with Lewy bodies: Secondary | ICD-10-CM | POA: Diagnosis not present

## 2014-03-24 DIAGNOSIS — IMO0001 Reserved for inherently not codable concepts without codable children: Secondary | ICD-10-CM | POA: Diagnosis not present

## 2014-03-28 ENCOUNTER — Other Ambulatory Visit: Payer: Self-pay | Admitting: *Deleted

## 2014-03-28 MED ORDER — ALBUTEROL SULFATE 1.25 MG/3ML IN NEBU: 1.0000 | INHALATION_SOLUTION | Freq: Four times a day (QID) | RESPIRATORY_TRACT | Status: DC | PRN

## 2014-03-28 NOTE — Telephone Encounter (Signed)
Patient daughter Holly Grimes Requested.

## 2014-03-29 DIAGNOSIS — I2510 Atherosclerotic heart disease of native coronary artery without angina pectoris: Secondary | ICD-10-CM | POA: Diagnosis not present

## 2014-03-29 DIAGNOSIS — IMO0001 Reserved for inherently not codable concepts without codable children: Secondary | ICD-10-CM | POA: Diagnosis not present

## 2014-03-29 DIAGNOSIS — R269 Unspecified abnormalities of gait and mobility: Secondary | ICD-10-CM | POA: Diagnosis not present

## 2014-03-29 DIAGNOSIS — I10 Essential (primary) hypertension: Secondary | ICD-10-CM | POA: Diagnosis not present

## 2014-03-29 DIAGNOSIS — F0280 Dementia in other diseases classified elsewhere without behavioral disturbance: Secondary | ICD-10-CM | POA: Diagnosis not present

## 2014-04-03 ENCOUNTER — Telehealth: Payer: Self-pay | Admitting: *Deleted

## 2014-04-03 DIAGNOSIS — G3183 Dementia with Lewy bodies: Secondary | ICD-10-CM | POA: Diagnosis not present

## 2014-04-03 DIAGNOSIS — R269 Unspecified abnormalities of gait and mobility: Secondary | ICD-10-CM | POA: Diagnosis not present

## 2014-04-03 DIAGNOSIS — I2510 Atherosclerotic heart disease of native coronary artery without angina pectoris: Secondary | ICD-10-CM | POA: Diagnosis not present

## 2014-04-03 DIAGNOSIS — IMO0001 Reserved for inherently not codable concepts without codable children: Secondary | ICD-10-CM | POA: Diagnosis not present

## 2014-04-03 DIAGNOSIS — I10 Essential (primary) hypertension: Secondary | ICD-10-CM | POA: Diagnosis not present

## 2014-04-03 DIAGNOSIS — F0280 Dementia in other diseases classified elsewhere without behavioral disturbance: Secondary | ICD-10-CM | POA: Diagnosis not present

## 2014-04-03 NOTE — Telephone Encounter (Signed)
I recommend she be seen.  Does Shanda BumpsJessica have any openings this week?

## 2014-04-03 NOTE — Telephone Encounter (Signed)
Patient daughter, Selena BattenKim, called and stated that patient is more Psychosis and Manic  and having to use Haldol more often now. Frequently saying she needs to use the bathroom and then unable to. Wants to have urine checked. Wants to drop off. [facility] about [facility]. Sometimes it takes 6 doses of haldol to get her to calm down. Please Advise.

## 2014-04-04 DIAGNOSIS — R4020 Unspecified coma: Secondary | ICD-10-CM | POA: Diagnosis not present

## 2014-04-04 DIAGNOSIS — F03918 Unspecified dementia, unspecified severity, with other behavioral disturbance: Secondary | ICD-10-CM | POA: Diagnosis not present

## 2014-04-04 DIAGNOSIS — R5383 Other fatigue: Secondary | ICD-10-CM | POA: Diagnosis not present

## 2014-04-04 DIAGNOSIS — R5381 Other malaise: Secondary | ICD-10-CM | POA: Diagnosis not present

## 2014-04-04 DIAGNOSIS — F0391 Unspecified dementia with behavioral disturbance: Secondary | ICD-10-CM | POA: Diagnosis not present

## 2014-04-04 NOTE — Telephone Encounter (Signed)
Daughter states they do have an appointment tomorrow.

## 2014-04-05 DIAGNOSIS — R5383 Other fatigue: Secondary | ICD-10-CM | POA: Diagnosis not present

## 2014-04-05 DIAGNOSIS — R6889 Other general symptoms and signs: Secondary | ICD-10-CM | POA: Diagnosis not present

## 2014-04-05 DIAGNOSIS — R350 Frequency of micturition: Secondary | ICD-10-CM | POA: Diagnosis not present

## 2014-04-05 DIAGNOSIS — F22 Delusional disorders: Secondary | ICD-10-CM | POA: Diagnosis not present

## 2014-04-05 DIAGNOSIS — R4182 Altered mental status, unspecified: Secondary | ICD-10-CM | POA: Diagnosis not present

## 2014-04-05 DIAGNOSIS — F0280 Dementia in other diseases classified elsewhere without behavioral disturbance: Secondary | ICD-10-CM | POA: Diagnosis not present

## 2014-04-05 DIAGNOSIS — R5381 Other malaise: Secondary | ICD-10-CM | POA: Diagnosis not present

## 2014-04-05 DIAGNOSIS — F02818 Dementia in other diseases classified elsewhere, unspecified severity, with other behavioral disturbance: Secondary | ICD-10-CM | POA: Diagnosis not present

## 2014-04-05 DIAGNOSIS — F29 Unspecified psychosis not due to a substance or known physiological condition: Secondary | ICD-10-CM | POA: Diagnosis not present

## 2014-04-05 DIAGNOSIS — F603 Borderline personality disorder: Secondary | ICD-10-CM | POA: Diagnosis not present

## 2014-04-05 DIAGNOSIS — IMO0002 Reserved for concepts with insufficient information to code with codable children: Secondary | ICD-10-CM | POA: Diagnosis not present

## 2014-04-05 DIAGNOSIS — G309 Alzheimer's disease, unspecified: Secondary | ICD-10-CM | POA: Diagnosis not present

## 2014-04-05 DIAGNOSIS — F0281 Dementia in other diseases classified elsewhere with behavioral disturbance: Secondary | ICD-10-CM | POA: Diagnosis not present

## 2014-04-19 DIAGNOSIS — I10 Essential (primary) hypertension: Secondary | ICD-10-CM | POA: Diagnosis not present

## 2014-04-19 DIAGNOSIS — R269 Unspecified abnormalities of gait and mobility: Secondary | ICD-10-CM | POA: Diagnosis not present

## 2014-04-19 DIAGNOSIS — IMO0001 Reserved for inherently not codable concepts without codable children: Secondary | ICD-10-CM | POA: Diagnosis not present

## 2014-04-19 DIAGNOSIS — F0280 Dementia in other diseases classified elsewhere without behavioral disturbance: Secondary | ICD-10-CM | POA: Diagnosis not present

## 2014-04-19 DIAGNOSIS — I2510 Atherosclerotic heart disease of native coronary artery without angina pectoris: Secondary | ICD-10-CM | POA: Diagnosis not present

## 2014-04-24 ENCOUNTER — Ambulatory Visit (INDEPENDENT_AMBULATORY_CARE_PROVIDER_SITE_OTHER): Payer: Medicare Other | Admitting: Internal Medicine

## 2014-04-24 ENCOUNTER — Encounter: Payer: Self-pay | Admitting: Internal Medicine

## 2014-04-24 VITALS — BP 154/88 | HR 90 | Temp 98.1°F | Wt 182.0 lb

## 2014-04-24 DIAGNOSIS — F0280 Dementia in other diseases classified elsewhere without behavioral disturbance: Secondary | ICD-10-CM | POA: Diagnosis not present

## 2014-04-24 DIAGNOSIS — G20A1 Parkinson's disease without dyskinesia, without mention of fluctuations: Secondary | ICD-10-CM

## 2014-04-24 DIAGNOSIS — K5901 Slow transit constipation: Secondary | ICD-10-CM | POA: Diagnosis not present

## 2014-04-24 DIAGNOSIS — N3941 Urge incontinence: Secondary | ICD-10-CM | POA: Diagnosis not present

## 2014-04-24 DIAGNOSIS — G3183 Dementia with Lewy bodies: Secondary | ICD-10-CM | POA: Diagnosis not present

## 2014-04-24 DIAGNOSIS — F329 Major depressive disorder, single episode, unspecified: Secondary | ICD-10-CM

## 2014-04-24 DIAGNOSIS — Z9181 History of falling: Secondary | ICD-10-CM

## 2014-04-24 DIAGNOSIS — I951 Orthostatic hypotension: Secondary | ICD-10-CM

## 2014-04-24 DIAGNOSIS — F3110 Bipolar disorder, current episode manic without psychotic features, unspecified: Secondary | ICD-10-CM

## 2014-04-24 DIAGNOSIS — G20 Parkinson's disease: Secondary | ICD-10-CM

## 2014-04-24 DIAGNOSIS — E669 Obesity, unspecified: Secondary | ICD-10-CM | POA: Insufficient documentation

## 2014-04-24 DIAGNOSIS — F3289 Other specified depressive episodes: Secondary | ICD-10-CM | POA: Diagnosis not present

## 2014-04-24 DIAGNOSIS — F32A Depression, unspecified: Secondary | ICD-10-CM

## 2014-04-24 MED ORDER — DIVALPROEX SODIUM 125 MG PO DR TAB: 125.0000 mg | DELAYED_RELEASE_TABLET | Freq: Two times a day (BID) | ORAL | Status: DC

## 2014-04-24 NOTE — Progress Notes (Signed)
Patient ID: Holly SchwalbeJoan M Elting, female   DOB: 1926-11-29, 80 y.o.   MRN: [id number]   Location:  [facility]  Code Status: DNR, has living will and her daughter is her HCPOA--scanned in media  Allergies  Allergen Reactions  . Cephalexin Nausea And Vomiting  . Sulfa Antibiotics Other (See Comments)    Daughter unsure of reaction, but believes it is severe  . Sulfites     Chief Complaint  Patient presents with  . Follow-up    3 week f/u with no labs prior,   . other    fell out of bed 04/24/14, bruises on mid-back & tailbone, scrape on shin, knee pain  . other    went ot the hospital recently for Alterd mental status and Dementia & started with symptoms yesterday @ 4 pm with increased agitation/confusion    HPI: Patient is a 80 y.o. white female seen in the office today for 3 month f/u.   Larey SeatFell out of bed this morning.  Has no recollection whatsoever.  645am, her daughter heard big boom.  Found her sitting with her legs outstretched in front of her and her back was up against the heavy nightstand.  Pt thought she hit her head, but no bumps were felt.  Small laceration on foot and has bruises of mid and lower back and tailbone.  Did not sleep well last night.  Got upset yesterday afternoon, went to bed early at 9pm b/c didn't nap in the day--slept for one hour, but then up off and on all night talking fast, hollering, fussing at people either in dreams or imagined.  More word finding issues, confusion than before.  No more weight loss--using boost with protein about once a day.  Her appetite is not as good as it used to be.  Less into meats.  Will eat yogurt and almonds in the morning.  Has been too much of a challenge to get her out of the house--will plan something ahead and then doesn't want to go last minute.  [facility].  Loves what happens when does go out, sleeps better.       [facility] with change in mental status  04/04/14.  This was felt to be due to her dementia.  It has been very hard to get her to properly wipe her bottom with good hygiene.  Her daughter wonders again about UTI.  She has had to give haldol frequently now due to difficulty with sleep and manic behaviors.  She has been awake for 2 full days straight.    Review of Systems:  Review of Systems  Constitutional: Positive for weight loss and malaise/fatigue.  HENT: Negative for congestion.   Eyes: Negative for blurred vision.  Respiratory: Negative for shortness of breath.   Cardiovascular: Negative for chest pain.  Gastrointestinal: Negative for abdominal pain, constipation, blood in stool and melena.  Genitourinary: Negative for dysuria, urgency and frequency.  Musculoskeletal: Positive for falls.  Skin: Negative for rash.  Neurological: Positive for weakness. Negative for dizziness and loss of consciousness.  Psychiatric/Behavioral: Positive for depression, hallucinations and memory loss. The patient has insomnia.     Past Medical History  Diagnosis Date  . Hypertension   . High cholesterol   . Mitral valve prolapse   . Dementia   . Parkinson's disease, Lewy body   . Unspecified constipation   . Onychia and paronychia of toe   . Spasm of  muscle   . Abnormality of gait   . Orthostatic hypotension   . Major depressive disorder, single episode, unspecified   . Diaphragmatic hernia without mention of obstruction or gangrene   . Alzheimer's disease   . Osteoarthrosis, unspecified whether generalized or localized, unspecified site   . Osteoporosis, unspecified   . Unspecified constipation   . Sprain of ribs   . Delirium due to conditions classified elsewhere   . Diarrhea   . Edema   . Pain in joint, pelvic region and thigh   . Other malaise and fatigue   . Urinary frequency [id number]  . Personal history of fall   . Female stress incontinence   . Other and unspecified hyperlipidemia   . Obesity, unspecified   .  Osteoarthrosis, unspecified whether generalized or localized, unspecified site   . Pain in joint, shoulder region   . Transient ischemic attack (TIA), and cerebral infarction without residual deficits(V12.54)     Past Surgical History  Procedure Laterality Date  . Joint replacement    . Tonsillectomy    . Ankle surgery Left 1998  . Total knee arthroplasty Right 2002  . Total knee arthroplasty Left 2005  . Hip arthroplasty Right 09/24/2013    Procedure: ARTHROPLASTY BIPOLAR HIP;  Surgeon: Loanne Drilling, MD;  Location: [facility];  Service: Orthopedics;  Laterality: Right;    Social History:   reports that she has never smoked. She does not have any smokeless tobacco history on file. She reports that she does not drink alcohol or use illicit drugs.  Family History  Problem Relation Age of Onset  . Adopted: Yes    Medications: Patient's Medications  New Prescriptions   No medications on file  Previous Medications   ACETAMINOPHEN (TYLENOL) 650 MG CR TABLET    Take 650 mg by mouth. 2 by mouth twice daily   ALBUTEROL (ACCUNEB) 1.25 MG/3ML NEBULIZER SOLUTION    Take 3 mLs (1.25 mg total) by nebulization every 6 (six) hours as needed for wheezing.   ASCORBIC ACID (VITAMIN C WITH ROSE HIPS) 1000 MG TABLET    Take 1,000 mg by mouth daily.     CALCIUM CARBONATE (OS-CAL) 600 MG TABS TABLET    Take 600 mg by mouth 2 (two) times daily with a meal.   CHOLECALCIFEROL (VITAMIN D3) 2000 UNITS TABS    Take 2,000 Units by mouth daily.    CITALOPRAM (CELEXA) 40 MG TABLET    Take 40 mg by mouth. 1/2 by mouth daily   CLOPIDOGREL (PLAVIX) 75 MG TABLET    Take one tablet by mouth once daily to help circulation   DOCUSATE SODIUM (COLACE) 100 MG CAPSULE    Take 100 mg by mouth daily.    FLUTICASONE (CUTIVATE) 0.05 % CREAM    Apply 1 application topically 2 (two) times daily. To affected areas   FOLIC ACID (FOLVITE) 400 MCG TABLET    Take 400 mcg by mouth daily.   GUAIFENESIN (MUCINEX MAXIMUM STRENGTH) 1200  MG TB12    Take by mouth 2 (two) times daily.   HALOPERIDOL (HALDOL) 0.5 MG TABLET    Take 0.5 mg by mouth every 6 (six) hours as needed (For Psychosis and agitation).    MEMANTINE HCL ER (NAMENDA XR) 28 MG CP24    Take 28 mg by mouth at bedtime. Take one tablet once a day to preserve memory   MIRABEGRON ER (MYRBETRIQ) 50 MG TB24 TABLET    Take 1 tablet (50 mg total) by  mouth daily.   MULTIPLE VITAMINS-MINERALS (CENTRUM SILVER ULTRA WOMENS PO)    Take 1 tablet by mouth every morning.     NYSTATIN (MYCOSTATIN/NYSTOP) 100000 UNIT/GM POWD    2 times a day for 2 weeks in LT auxillia   OMEGA-3 ACID ETHYL ESTERS (LOVAZA) 1 G CAPSULE    Take 2 capsules (2 g total) by mouth 2 (two) times daily.   OMEPRAZOLE (PRILOSEC) 20 MG CAPSULE    Take one capsule by mouth every day to reduce stomach acid secretion   PROBIOTIC PRODUCT (ALIGN) 4 MG CAPS    Take 1 capsule (4 mg total) by mouth daily.   RIVASTIGMINE (EXELON) 4.6 MG/24HR    Place 1 patch onto the skin daily.    ROSUVASTATIN (CRESTOR) 10 MG TABLET    Take 1 tablet (10 mg total) by mouth at bedtime.   WHEAT DEXTRIN (BENEFIBER) POWD    Take by mouth as needed.  Modified Medications   No medications on file  Discontinued Medications   CALCIUM CITRATE PO    Take 1,200 mg by mouth 2 (two) times daily. Also includes 1000 iu Vit D3, 80 mg magnesium, 5 mg sodium     Physical Exam: Filed Vitals:   04/24/14 1329  BP: 154/88  Pulse: 90  Temp: 98.1 F (36.7 C)  TempSrc: Oral  Weight: 182 lb (82.555 kg)  SpO2: 93%  Physical Exam  Constitutional: She appears well-developed and well-nourished. No distress.  Cardiovascular: Normal rate, regular rhythm, normal heart sounds and intact distal pulses.   Pulmonary/Chest: Effort normal and breath sounds normal. No respiratory distress.  Abdominal: Soft. Bowel sounds are normal. She exhibits no distension and no mass. There is no tenderness.  Musculoskeletal: Normal range of motion.  Neurological: She is alert.    Skin: Skin is warm and dry. There is pallor.  Psychiatric: She has a normal mood and affect.   Labs reviewed: Basic Metabolic Panel:  Recent Labs  40/98/11 0506 11/30/13 1544 01/19/14 1611  NA 135 140 139  K 3.9 4.3 4.2  CL 101 101 98  CO2 26 24 26   GLUCOSE 91 107* 92  BUN 12 14 16   CREATININE 0.73 0.79 0.77  CALCIUM 8.4 9.1 9.7  CBC:  Recent Labs  09/27/13 0506 11/30/13 1544 01/19/14 1611  WBC 6.7 6.5 5.0  NEUTROABS  --  3.3 2.7  HGB 9.3* 11.9 12.7  HCT 27.6* 36.3 38.9  MCV 100.0 94 95  PLT 148* 320 209    Assessment/Plan 1. Parkinson's disease, Lewy body -cont namenda, exelon, prn haldol  2. Slow transit constipation -cont current regimen, adequate hydration and fiber intake  3. Depression -cont celexa  4. Urge incontinence -cont myrbetriq 50mg  daily  5. Orthostatic hypotension -family watches her closely, to use walker at all tiimes and get up slowly, but pt frequently forgets, not on bp meds  6. History of fall -mixed etiology, has some bruising but no outright injuries/fxs  7. Bipolar I disorder, most recent episode (or current) manic -cont celexa with depakote for mood stabilization--check level  Labs/tests ordered:   Orders Placed This Encounter  Procedures  . Valproic Acid Level    Standing Status: Future     Number of Occurrences:      Standing Expiration Date: 07/25/2014   Next appt:  6 wks with Shanda Bumps, then 3 mos with me

## 2014-04-26 DIAGNOSIS — I10 Essential (primary) hypertension: Secondary | ICD-10-CM | POA: Diagnosis not present

## 2014-04-26 DIAGNOSIS — G3183 Dementia with Lewy bodies: Secondary | ICD-10-CM | POA: Diagnosis not present

## 2014-04-26 DIAGNOSIS — F0280 Dementia in other diseases classified elsewhere without behavioral disturbance: Secondary | ICD-10-CM | POA: Diagnosis not present

## 2014-04-26 DIAGNOSIS — IMO0001 Reserved for inherently not codable concepts without codable children: Secondary | ICD-10-CM | POA: Diagnosis not present

## 2014-04-26 DIAGNOSIS — R269 Unspecified abnormalities of gait and mobility: Secondary | ICD-10-CM | POA: Diagnosis not present

## 2014-04-26 DIAGNOSIS — I2510 Atherosclerotic heart disease of native coronary artery without angina pectoris: Secondary | ICD-10-CM | POA: Diagnosis not present

## 2014-04-27 ENCOUNTER — Telehealth: Payer: Self-pay | Admitting: *Deleted

## 2014-04-27 DIAGNOSIS — F3110 Bipolar disorder, current episode manic without psychotic features, unspecified: Secondary | ICD-10-CM

## 2014-04-27 DIAGNOSIS — I2510 Atherosclerotic heart disease of native coronary artery without angina pectoris: Secondary | ICD-10-CM | POA: Diagnosis not present

## 2014-04-27 DIAGNOSIS — R269 Unspecified abnormalities of gait and mobility: Secondary | ICD-10-CM | POA: Diagnosis not present

## 2014-04-27 DIAGNOSIS — IMO0001 Reserved for inherently not codable concepts without codable children: Secondary | ICD-10-CM | POA: Diagnosis not present

## 2014-04-27 DIAGNOSIS — F0280 Dementia in other diseases classified elsewhere without behavioral disturbance: Secondary | ICD-10-CM | POA: Diagnosis not present

## 2014-04-27 DIAGNOSIS — F329 Major depressive disorder, single episode, unspecified: Secondary | ICD-10-CM

## 2014-04-27 DIAGNOSIS — I10 Essential (primary) hypertension: Secondary | ICD-10-CM | POA: Diagnosis not present

## 2014-04-27 DIAGNOSIS — F32A Depression, unspecified: Secondary | ICD-10-CM

## 2014-04-27 MED ORDER — DIVALPROEX SODIUM 125 MG PO CPSP: ORAL_CAPSULE | ORAL | Status: DC

## 2014-04-27 NOTE — Telephone Encounter (Signed)
Patient daughter called and stated that the Depakote 125mg  twice daily is causing sedation. She wants to know if you will switch it from time release to sprinkles and go to 125 once daily at bedtime. Please Advise.

## 2014-04-27 NOTE — Telephone Encounter (Signed)
Yes, switch to depakote sprinkles 125mg  po qhs

## 2014-04-27 NOTE — Telephone Encounter (Signed)
Spoke with Holly BattenKim and faxed Rx into pharmacy

## 2014-05-02 DIAGNOSIS — I2510 Atherosclerotic heart disease of native coronary artery without angina pectoris: Secondary | ICD-10-CM | POA: Diagnosis not present

## 2014-05-02 DIAGNOSIS — R269 Unspecified abnormalities of gait and mobility: Secondary | ICD-10-CM | POA: Diagnosis not present

## 2014-05-02 DIAGNOSIS — I10 Essential (primary) hypertension: Secondary | ICD-10-CM | POA: Diagnosis not present

## 2014-05-02 DIAGNOSIS — IMO0001 Reserved for inherently not codable concepts without codable children: Secondary | ICD-10-CM | POA: Diagnosis not present

## 2014-05-02 DIAGNOSIS — G3183 Dementia with Lewy bodies: Secondary | ICD-10-CM | POA: Diagnosis not present

## 2014-05-02 DIAGNOSIS — F0280 Dementia in other diseases classified elsewhere without behavioral disturbance: Secondary | ICD-10-CM | POA: Diagnosis not present

## 2014-05-05 DIAGNOSIS — M810 Age-related osteoporosis without current pathological fracture: Secondary | ICD-10-CM | POA: Diagnosis not present

## 2014-05-05 DIAGNOSIS — G3183 Dementia with Lewy bodies: Secondary | ICD-10-CM | POA: Diagnosis not present

## 2014-05-05 DIAGNOSIS — I2510 Atherosclerotic heart disease of native coronary artery without angina pectoris: Secondary | ICD-10-CM | POA: Diagnosis not present

## 2014-05-05 DIAGNOSIS — Z96649 Presence of unspecified artificial hip joint: Secondary | ICD-10-CM | POA: Diagnosis not present

## 2014-05-05 DIAGNOSIS — R269 Unspecified abnormalities of gait and mobility: Secondary | ICD-10-CM | POA: Diagnosis not present

## 2014-05-05 DIAGNOSIS — M1990 Unspecified osteoarthritis, unspecified site: Secondary | ICD-10-CM | POA: Diagnosis not present

## 2014-05-05 DIAGNOSIS — IMO0001 Reserved for inherently not codable concepts without codable children: Secondary | ICD-10-CM | POA: Diagnosis not present

## 2014-05-05 DIAGNOSIS — Z9181 History of falling: Secondary | ICD-10-CM | POA: Diagnosis not present

## 2014-05-05 DIAGNOSIS — I10 Essential (primary) hypertension: Secondary | ICD-10-CM | POA: Diagnosis not present

## 2014-05-05 DIAGNOSIS — F0280 Dementia in other diseases classified elsewhere without behavioral disturbance: Secondary | ICD-10-CM | POA: Diagnosis not present

## 2014-05-11 DIAGNOSIS — I2510 Atherosclerotic heart disease of native coronary artery without angina pectoris: Secondary | ICD-10-CM | POA: Diagnosis not present

## 2014-05-11 DIAGNOSIS — R269 Unspecified abnormalities of gait and mobility: Secondary | ICD-10-CM | POA: Diagnosis not present

## 2014-05-11 DIAGNOSIS — G3183 Dementia with Lewy bodies: Secondary | ICD-10-CM | POA: Diagnosis not present

## 2014-05-11 DIAGNOSIS — I10 Essential (primary) hypertension: Secondary | ICD-10-CM | POA: Diagnosis not present

## 2014-05-11 DIAGNOSIS — IMO0001 Reserved for inherently not codable concepts without codable children: Secondary | ICD-10-CM | POA: Diagnosis not present

## 2014-05-11 DIAGNOSIS — F0280 Dementia in other diseases classified elsewhere without behavioral disturbance: Secondary | ICD-10-CM | POA: Diagnosis not present

## 2014-05-16 DIAGNOSIS — IMO0001 Reserved for inherently not codable concepts without codable children: Secondary | ICD-10-CM | POA: Diagnosis not present

## 2014-05-16 DIAGNOSIS — G3183 Dementia with Lewy bodies: Secondary | ICD-10-CM | POA: Diagnosis not present

## 2014-05-16 DIAGNOSIS — I2510 Atherosclerotic heart disease of native coronary artery without angina pectoris: Secondary | ICD-10-CM | POA: Diagnosis not present

## 2014-05-16 DIAGNOSIS — F0280 Dementia in other diseases classified elsewhere without behavioral disturbance: Secondary | ICD-10-CM | POA: Diagnosis not present

## 2014-05-16 DIAGNOSIS — R269 Unspecified abnormalities of gait and mobility: Secondary | ICD-10-CM | POA: Diagnosis not present

## 2014-05-16 DIAGNOSIS — I10 Essential (primary) hypertension: Secondary | ICD-10-CM | POA: Diagnosis not present

## 2014-05-18 DIAGNOSIS — IMO0001 Reserved for inherently not codable concepts without codable children: Secondary | ICD-10-CM | POA: Diagnosis not present

## 2014-05-18 DIAGNOSIS — I10 Essential (primary) hypertension: Secondary | ICD-10-CM | POA: Diagnosis not present

## 2014-05-18 DIAGNOSIS — R269 Unspecified abnormalities of gait and mobility: Secondary | ICD-10-CM | POA: Diagnosis not present

## 2014-05-18 DIAGNOSIS — G3183 Dementia with Lewy bodies: Secondary | ICD-10-CM | POA: Diagnosis not present

## 2014-05-18 DIAGNOSIS — I2510 Atherosclerotic heart disease of native coronary artery without angina pectoris: Secondary | ICD-10-CM | POA: Diagnosis not present

## 2014-05-18 DIAGNOSIS — F0280 Dementia in other diseases classified elsewhere without behavioral disturbance: Secondary | ICD-10-CM | POA: Diagnosis not present

## 2014-05-23 DIAGNOSIS — R269 Unspecified abnormalities of gait and mobility: Secondary | ICD-10-CM | POA: Diagnosis not present

## 2014-05-23 DIAGNOSIS — G3183 Dementia with Lewy bodies: Secondary | ICD-10-CM | POA: Diagnosis not present

## 2014-05-23 DIAGNOSIS — I10 Essential (primary) hypertension: Secondary | ICD-10-CM | POA: Diagnosis not present

## 2014-05-23 DIAGNOSIS — I2510 Atherosclerotic heart disease of native coronary artery without angina pectoris: Secondary | ICD-10-CM | POA: Diagnosis not present

## 2014-05-23 DIAGNOSIS — F0280 Dementia in other diseases classified elsewhere without behavioral disturbance: Secondary | ICD-10-CM | POA: Diagnosis not present

## 2014-05-23 DIAGNOSIS — IMO0001 Reserved for inherently not codable concepts without codable children: Secondary | ICD-10-CM | POA: Diagnosis not present

## 2014-05-30 DIAGNOSIS — IMO0001 Reserved for inherently not codable concepts without codable children: Secondary | ICD-10-CM | POA: Diagnosis not present

## 2014-05-30 DIAGNOSIS — F0280 Dementia in other diseases classified elsewhere without behavioral disturbance: Secondary | ICD-10-CM | POA: Diagnosis not present

## 2014-05-30 DIAGNOSIS — I10 Essential (primary) hypertension: Secondary | ICD-10-CM | POA: Diagnosis not present

## 2014-05-30 DIAGNOSIS — R269 Unspecified abnormalities of gait and mobility: Secondary | ICD-10-CM | POA: Diagnosis not present

## 2014-05-30 DIAGNOSIS — I2510 Atherosclerotic heart disease of native coronary artery without angina pectoris: Secondary | ICD-10-CM | POA: Diagnosis not present

## 2014-06-01 DIAGNOSIS — R269 Unspecified abnormalities of gait and mobility: Secondary | ICD-10-CM | POA: Diagnosis not present

## 2014-06-01 DIAGNOSIS — F0280 Dementia in other diseases classified elsewhere without behavioral disturbance: Secondary | ICD-10-CM | POA: Diagnosis not present

## 2014-06-01 DIAGNOSIS — I2510 Atherosclerotic heart disease of native coronary artery without angina pectoris: Secondary | ICD-10-CM | POA: Diagnosis not present

## 2014-06-01 DIAGNOSIS — G3183 Dementia with Lewy bodies: Secondary | ICD-10-CM | POA: Diagnosis not present

## 2014-06-01 DIAGNOSIS — IMO0001 Reserved for inherently not codable concepts without codable children: Secondary | ICD-10-CM | POA: Diagnosis not present

## 2014-06-01 DIAGNOSIS — I10 Essential (primary) hypertension: Secondary | ICD-10-CM | POA: Diagnosis not present

## 2014-06-05 ENCOUNTER — Telehealth: Payer: Self-pay | Admitting: *Deleted

## 2014-06-05 ENCOUNTER — Other Ambulatory Visit: Payer: Medicare Other

## 2014-06-05 DIAGNOSIS — R8290 Unspecified abnormal findings in urine: Secondary | ICD-10-CM

## 2014-06-05 NOTE — Telephone Encounter (Signed)
Patient called and thinks she is over sedated with the Depakote. She is psychotic and Manic. Been giving her 1/2 of a capsule of sprinkles. Can not manage her because she is knocked out all the time. Daughter skipped a dose to see how she would do and the day she skipped by that afternoon she was manic. She kept her off of it Friday and Saturday and was up for 6 hours. She has not been asleep since Sunday. Daughter wonders about a referral to the [facility] -Dr. Tawni Pummel) or just start Haldol again?  Please Advise.

## 2014-06-06 ENCOUNTER — Other Ambulatory Visit: Payer: Self-pay

## 2014-06-06 DIAGNOSIS — R82998 Other abnormal findings in urine: Secondary | ICD-10-CM | POA: Diagnosis not present

## 2014-06-06 MED ORDER — HALOPERIDOL 0.5 MG PO TABS: ORAL_TABLET | ORAL | Status: DC

## 2014-06-06 NOTE — Telephone Encounter (Signed)
Daughter came by the office and wants to know the status of message and also wants to know if you will write an order for a U/A and Culture. Please Advise.

## 2014-06-06 NOTE — Telephone Encounter (Signed)
Spoke with Daughter and she agreed. Faxed Rx into pharmacy for the Haldol and discontinued the Depakote in medication list.

## 2014-06-06 NOTE — Telephone Encounter (Signed)
Let's stop the depakote and restart the haldol as needed for her "manic" spells (same dose as previously given).

## 2014-06-06 NOTE — Telephone Encounter (Signed)
Spoke with Dr. Allyne Gee is to review message. I went ahead and ordered the UA and Culture due to it being 5:00.

## 2014-06-07 LAB — URINALYSIS
Bilirubin, UA: NEGATIVE
Glucose, UA: NEGATIVE
Ketones, UA: NEGATIVE
Nitrite, UA: NEGATIVE
Protein, UA: NEGATIVE
RBC, UA: NEGATIVE
Specific Gravity, UA: 1.015 (ref 1.005–1.030)
Urobilinogen, Ur: 0.2 mg/dL (ref 0.0–1.9)
pH, UA: 6.5 (ref 5.0–7.5)

## 2014-06-07 NOTE — Telephone Encounter (Signed)
Ok to give tylenol for fever.  Maybe cellulitis is causing the fever and appearance.  Is there any way she can bring her in to be seen tomorrow?  Benadryl orally may cause worsened confusion.  Would use topically only.

## 2014-06-07 NOTE — Telephone Encounter (Signed)
Patient daughter,Kim, Jeanene Erb and stated that  Patient is worse today. Patient has been having a low grade fever of 100.7. Her arm doesn't look right either, thinks something may have bite her or she scratched it. Swelling and redness and warm to the touch near her elbow and the swelling is all the way down her arm to her fingertips. No trouble breathing or chest pains. It has approved since this morning and should she give her something for her fever and benedryl?  Please Advise.

## 2014-06-08 ENCOUNTER — Emergency Department (HOSPITAL_COMMUNITY): Payer: Medicare Other

## 2014-06-08 ENCOUNTER — Encounter (HOSPITAL_COMMUNITY): Payer: Self-pay | Admitting: Emergency Medicine

## 2014-06-08 ENCOUNTER — Inpatient Hospital Stay (HOSPITAL_COMMUNITY)
Admission: EM | Admit: 2014-06-08 | Discharge: 2014-06-13 | DRG: 602 | Disposition: A | Discharge status: Skilled Nursing Facility | Payer: Medicare Other | Attending: Internal Medicine | Admitting: Internal Medicine

## 2014-06-08 DIAGNOSIS — F319 Bipolar disorder, unspecified: Secondary | ICD-10-CM | POA: Diagnosis present

## 2014-06-08 DIAGNOSIS — F29 Unspecified psychosis not due to a substance or known physiological condition: Secondary | ICD-10-CM | POA: Diagnosis not present

## 2014-06-08 DIAGNOSIS — I5031 Acute diastolic (congestive) heart failure: Secondary | ICD-10-CM | POA: Diagnosis present

## 2014-06-08 DIAGNOSIS — R509 Fever, unspecified: Secondary | ICD-10-CM

## 2014-06-08 DIAGNOSIS — I5030 Unspecified diastolic (congestive) heart failure: Secondary | ICD-10-CM | POA: Diagnosis not present

## 2014-06-08 DIAGNOSIS — I10 Essential (primary) hypertension: Secondary | ICD-10-CM | POA: Diagnosis present

## 2014-06-08 DIAGNOSIS — R4182 Altered mental status, unspecified: Secondary | ICD-10-CM | POA: Diagnosis not present

## 2014-06-08 DIAGNOSIS — I509 Heart failure, unspecified: Secondary | ICD-10-CM | POA: Diagnosis present

## 2014-06-08 DIAGNOSIS — Z6832 Body mass index (BMI) 32.0-32.9, adult: Secondary | ICD-10-CM

## 2014-06-08 DIAGNOSIS — G20A1 Parkinson's disease without dyskinesia, without mention of fluctuations: Secondary | ICD-10-CM | POA: Diagnosis not present

## 2014-06-08 DIAGNOSIS — R51 Headache: Secondary | ICD-10-CM | POA: Diagnosis not present

## 2014-06-08 DIAGNOSIS — F3110 Bipolar disorder, current episode manic without psychotic features, unspecified: Secondary | ICD-10-CM

## 2014-06-08 DIAGNOSIS — M1990 Unspecified osteoarthritis, unspecified site: Secondary | ICD-10-CM | POA: Diagnosis present

## 2014-06-08 DIAGNOSIS — Z96649 Presence of unspecified artificial hip joint: Secondary | ICD-10-CM | POA: Diagnosis not present

## 2014-06-08 DIAGNOSIS — M2540 Effusion, unspecified joint: Secondary | ICD-10-CM | POA: Diagnosis not present

## 2014-06-08 DIAGNOSIS — Z7902 Long term (current) use of antithrombotics/antiplatelets: Secondary | ICD-10-CM

## 2014-06-08 DIAGNOSIS — Z8673 Personal history of transient ischemic attack (TIA), and cerebral infarction without residual deficits: Secondary | ICD-10-CM

## 2014-06-08 DIAGNOSIS — IMO0002 Reserved for concepts with insufficient information to code with codable children: Principal | ICD-10-CM | POA: Diagnosis present

## 2014-06-08 DIAGNOSIS — G309 Alzheimer's disease, unspecified: Secondary | ICD-10-CM | POA: Diagnosis present

## 2014-06-08 DIAGNOSIS — M25439 Effusion, unspecified wrist: Secondary | ICD-10-CM | POA: Diagnosis not present

## 2014-06-08 DIAGNOSIS — I059 Rheumatic mitral valve disease, unspecified: Secondary | ICD-10-CM | POA: Diagnosis present

## 2014-06-08 DIAGNOSIS — E785 Hyperlipidemia, unspecified: Secondary | ICD-10-CM | POA: Diagnosis present

## 2014-06-08 DIAGNOSIS — Z79899 Other long term (current) drug therapy: Secondary | ICD-10-CM

## 2014-06-08 DIAGNOSIS — M6281 Muscle weakness (generalized): Secondary | ICD-10-CM | POA: Diagnosis not present

## 2014-06-08 DIAGNOSIS — E669 Obesity, unspecified: Secondary | ICD-10-CM | POA: Diagnosis present

## 2014-06-08 DIAGNOSIS — Z66 Do not resuscitate: Secondary | ICD-10-CM | POA: Diagnosis present

## 2014-06-08 DIAGNOSIS — M19039 Primary osteoarthritis, unspecified wrist: Secondary | ICD-10-CM | POA: Diagnosis not present

## 2014-06-08 DIAGNOSIS — G3183 Dementia with Lewy bodies: Secondary | ICD-10-CM

## 2014-06-08 DIAGNOSIS — M25432 Effusion, left wrist: Secondary | ICD-10-CM

## 2014-06-08 DIAGNOSIS — R5381 Other malaise: Secondary | ICD-10-CM

## 2014-06-08 DIAGNOSIS — F0390 Unspecified dementia without behavioral disturbance: Secondary | ICD-10-CM

## 2014-06-08 DIAGNOSIS — R6889 Other general symptoms and signs: Secondary | ICD-10-CM | POA: Diagnosis not present

## 2014-06-08 DIAGNOSIS — M436 Torticollis: Secondary | ICD-10-CM

## 2014-06-08 DIAGNOSIS — R262 Difficulty in walking, not elsewhere classified: Secondary | ICD-10-CM | POA: Diagnosis not present

## 2014-06-08 DIAGNOSIS — R410 Disorientation, unspecified: Secondary | ICD-10-CM

## 2014-06-08 DIAGNOSIS — F0391 Unspecified dementia with behavioral disturbance: Secondary | ICD-10-CM

## 2014-06-08 DIAGNOSIS — Z96659 Presence of unspecified artificial knee joint: Secondary | ICD-10-CM | POA: Diagnosis not present

## 2014-06-08 DIAGNOSIS — M109 Gout, unspecified: Secondary | ICD-10-CM | POA: Diagnosis present

## 2014-06-08 DIAGNOSIS — F03918 Unspecified dementia, unspecified severity, with other behavioral disturbance: Secondary | ICD-10-CM | POA: Diagnosis not present

## 2014-06-08 DIAGNOSIS — M810 Age-related osteoporosis without current pathological fracture: Secondary | ICD-10-CM | POA: Diagnosis present

## 2014-06-08 DIAGNOSIS — F0280 Dementia in other diseases classified elsewhere without behavioral disturbance: Secondary | ICD-10-CM | POA: Diagnosis present

## 2014-06-08 DIAGNOSIS — R531 Weakness: Secondary | ICD-10-CM

## 2014-06-08 DIAGNOSIS — R5383 Other fatigue: Secondary | ICD-10-CM

## 2014-06-08 DIAGNOSIS — G9340 Encephalopathy, unspecified: Secondary | ICD-10-CM | POA: Diagnosis not present

## 2014-06-08 LAB — COMPREHENSIVE METABOLIC PANEL
ALT: 12 U/L (ref 0–35)
AST: 28 U/L (ref 0–37)
Albumin: 3.2 g/dL — ABNORMAL LOW (ref 3.5–5.2)
Alkaline Phosphatase: 52 U/L (ref 39–117)
Anion gap: 16 — ABNORMAL HIGH (ref 5–15)
BUN: 15 mg/dL (ref 6–23)
CALCIUM: 9.5 mg/dL (ref 8.4–10.5)
CO2: 24 mEq/L (ref 19–32)
Chloride: 95 mEq/L — ABNORMAL LOW (ref 96–112)
Creatinine, Ser: 0.68 mg/dL (ref 0.50–1.10)
GFR calc non Af Amer: 78 mL/min — ABNORMAL LOW (ref >90)
GLUCOSE: 127 mg/dL — AB (ref 70–99)
Potassium: 3.4 mEq/L — ABNORMAL LOW (ref 3.7–5.3)
Sodium: 135 mEq/L — ABNORMAL LOW (ref 137–147)
TOTAL PROTEIN: 7.5 g/dL (ref 6.0–8.3)
Total Bilirubin: 1.1 mg/dL (ref 0.3–1.2)

## 2014-06-08 LAB — URINALYSIS, ROUTINE W REFLEX MICROSCOPIC
Bilirubin Urine: NEGATIVE
Glucose, UA: NEGATIVE mg/dL
HGB URINE DIPSTICK: NEGATIVE
Ketones, ur: 15 mg/dL — AB
LEUKOCYTES UA: NEGATIVE
Nitrite: NEGATIVE
PROTEIN: NEGATIVE mg/dL
Specific Gravity, Urine: 1.028 (ref 1.005–1.030)
Urobilinogen, UA: 0.2 mg/dL (ref 0.0–1.0)
pH: 5.5 (ref 5.0–8.0)

## 2014-06-08 LAB — CBC WITH DIFFERENTIAL/PLATELET
Basophils Absolute: 0 10*3/uL (ref 0.0–0.1)
Basophils Relative: 0 % (ref 0–1)
EOS ABS: 0.1 10*3/uL (ref 0.0–0.7)
EOS PCT: 1 % (ref 0–5)
HCT: 40.9 % (ref 36.0–46.0)
HEMOGLOBIN: 13.8 g/dL (ref 12.0–15.0)
Lymphocytes Relative: 9 % — ABNORMAL LOW (ref 12–46)
Lymphs Abs: 0.9 10*3/uL (ref 0.7–4.0)
MCH: 33.2 pg (ref 26.0–34.0)
MCHC: 33.7 g/dL (ref 30.0–36.0)
MCV: 98.3 fL (ref 78.0–100.0)
MONOS PCT: 11 % (ref 3–12)
Monocytes Absolute: 1 10*3/uL (ref 0.1–1.0)
Neutro Abs: 7.5 10*3/uL (ref 1.7–7.7)
Neutrophils Relative %: 79 % — ABNORMAL HIGH (ref 43–77)
Platelets: 177 10*3/uL (ref 150–400)
RBC: 4.16 MIL/uL (ref 3.87–5.11)
RDW: 12.1 % (ref 11.5–15.5)
WBC: 9.5 10*3/uL (ref 4.0–10.5)

## 2014-06-08 LAB — I-STAT CG4 LACTIC ACID, ED: Lactic Acid, Venous: 1.35 mmol/L (ref 0.5–2.2)

## 2014-06-08 LAB — URINE CULTURE

## 2014-06-08 LAB — SEDIMENTATION RATE: Sed Rate: 98 mm/hr — ABNORMAL HIGH (ref 0–22)

## 2014-06-08 MED ORDER — SODIUM CHLORIDE 0.9 % IV BOLUS (SEPSIS)
30.0000 mL/kg | Freq: Once | INTRAVENOUS | Status: AC
Start: 2014-06-08 — End: 2014-06-08
  Administered 2014-06-08: 2478 mL via INTRAVENOUS

## 2014-06-08 MED ORDER — SODIUM CHLORIDE 0.9 % IV SOLN
1000.0000 mL | INTRAVENOUS | Status: DC
  Administered 2014-06-08 (×2): 1000 mL via INTRAVENOUS

## 2014-06-08 MED ORDER — LABETALOL HCL 5 MG/ML IV SOLN
10.0000 mg | Freq: Once | INTRAVENOUS | Status: AC
  Administered 2014-06-08: 10 mg via INTRAVENOUS
  Filled 2014-06-08: qty 4

## 2014-06-08 NOTE — ED Provider Notes (Signed)
CSN: [id number]     Arrival date & time 06/08/14  1605 History   First MD Initiated Contact with Patient 06/08/14 1610     Chief Complaint  Patient presents with  . Otalgia  . Urinary Tract Infection   (Consider location/radiation/quality/duration/timing/severity/associated sxs/prior Treatment) HPI Holly Grimes is a 80 yo female with PMH: HTN, high cholesterol, Alzheimer's, dementia, hip replacement, presenting to the ED with report from daughter she has had fever at home, seems to hurt all over, and is less active for the past week.  Family states they have had to lift her from the bed to bedside commode, which has progressively worse this week. She has been crying out in pain when she is moved and has pain and swelling in her left wrist.  The daughter denies any falls, vomiting,  diarrhea, report of dysuria or abd pain or strong smelling urine.   Past Medical History  Diagnosis Date  . Hypertension   . High cholesterol   . Mitral valve prolapse   . Dementia   . Parkinson's disease, Lewy body   . Unspecified constipation   . Onychia and paronychia of toe   . Spasm of muscle   . Abnormality of gait   . Orthostatic hypotension   . Major depressive disorder, single episode, unspecified   . Diaphragmatic hernia without mention of obstruction or gangrene   . Alzheimer's disease   . Osteoarthrosis, unspecified whether generalized or localized, unspecified site   . Osteoporosis, unspecified   . Unspecified constipation   . Sprain of ribs   . Delirium due to conditions classified elsewhere   . Diarrhea   . Edema   . Pain in joint, pelvic region and thigh   . Other malaise and fatigue   . Urinary frequency [id number]  . Personal history of fall   . Female stress incontinence   . Other and unspecified hyperlipidemia   . Obesity, unspecified   . Osteoarthrosis, unspecified whether generalized or localized, unspecified site   . Pain in joint, shoulder region   . Transient ischemic attack  (TIA), and cerebral infarction without residual deficits(V12.54)    Past Surgical History  Procedure Laterality Date  . Joint replacement    . Tonsillectomy    . Ankle surgery Left 1998  . Total knee arthroplasty Right 2002  . Total knee arthroplasty Left 2005  . Hip arthroplasty Right 09/24/2013    Procedure: ARTHROPLASTY BIPOLAR HIP;  Surgeon: Loanne Drilling, MD;  Location: [facility];  Service: Orthopedics;  Laterality: Right;   Family History  Problem Relation Age of Onset  . Adopted: Yes   History  Substance Use Topics  . Smoking status: Never Smoker   . Smokeless tobacco: Not on file  . Alcohol Use: No   OB History   Grav Para Term Preterm Abortions TAB SAB Ect Mult Living                 Review of Systems  Constitutional: Positive for fever, activity change and fatigue. Negative for chills.  HENT: Positive for ear pain.   Eyes: Negative for visual disturbance.  Respiratory: Negative for cough and shortness of breath.   Cardiovascular: Negative for chest pain and leg swelling.  Gastrointestinal: Negative for nausea, vomiting, abdominal pain and diarrhea.  Genitourinary: Negative for dysuria.  Musculoskeletal: Positive for arthralgias and joint swelling. Negative for myalgias.  Skin: Negative for rash.  Neurological: Negative for facial asymmetry, weakness, numbness and headaches.  Allergies  Cephalexin; Sulfa antibiotics; and Sulfites  Home Medications   Prior to Admission medications   Medication Sig Start Date End Date Taking? Authorizing Provider  acetaminophen (TYLENOL) 650 MG CR tablet Take 650 mg by mouth. Take 650 mg by mouth every 8 hours formulation    Historical Provider, MD  albuterol (ACCUNEB) 1.25 MG/3ML nebulizer solution Take 3 mLs (1.25 mg total) by nebulization every 6 (six) hours as needed for wheezing. 03/28/14   Oneal Grout, MD  Ascorbic Acid (VITAMIN C WITH ROSE HIPS) 1000 MG tablet Take 1,000 mg by mouth daily.      Historical Provider,  MD  calcium carbonate (OS-CAL) 600 MG TABS tablet Take 600 mg by mouth 2 (two) times daily with a meal.    Historical Provider, MD  Cholecalciferol (VITAMIN D3) 2000 UNITS TABS Take 2,000 Units by mouth daily.     Historical Provider, MD  citalopram (CELEXA) 40 MG tablet Take 40 mg by mouth. 1/2 by mouth daily    Historical Provider, MD  clopidogrel (PLAVIX) 75 MG tablet Take one tablet by mouth once daily to help circulation 01/19/14   Tiffany L Reed, DO  docusate sodium (COLACE) 100 MG capsule Take 100 mg by mouth daily.     Historical Provider, MD  feeding supplement (BOOST HIGH PROTEIN) LIQD Take 1 Container by mouth daily.    Historical Provider, MD  fluticasone (CUTIVATE) 0.05 % cream Apply 1 application topically 2 (two) times daily. To affected areas as needed 11/30/13   Claudie Revering, NP  folic acid (FOLVITE) 400 MCG tablet Take 400 mcg by mouth daily.    Historical Provider, MD  Guaifenesin [facility] MAXIMUM STRENGTH) 1200 MG TB12 Take by mouth 2 (two) times daily.    Historical Provider, MD  haloperidol (HALDOL) 0.5 MG tablet Take one tablet by mouth once daily as needed for agitation/psychosis 06/06/14   Tiffany L Reed, DO  Memantine HCl ER (NAMENDA XR) 28 MG CP24 Take 28 mg by mouth at bedtime. Take one tablet once a day to preserve memory 01/19/14   Tiffany L Reed, DO  mirabegron ER (MYRBETRIQ) 50 MG TB24 tablet Take 1 tablet (50 mg total) by mouth daily. 12/15/13   Tiffany L Reed, DO  Multiple Vitamins-Minerals (CENTRUM SILVER ULTRA WOMENS PO) Take 1 tablet by mouth every morning.      Historical Provider, MD  nystatin (MYCOSTATIN/NYSTOP) 100000 UNIT/GM POWD 2 times a day for 2 weeks in LT auxillia 12/15/13   Tiffany L Reed, DO  omega-3 acid ethyl esters (LOVAZA) 1 G capsule Take 2 capsules (2 g total) by mouth 2 (two) times daily. 01/19/14   Tiffany L Reed, DO  omeprazole (PRILOSEC) 20 MG capsule Take one capsule by mouth every day to reduce stomach acid secretion 01/19/14   Tiffany L Reed,  DO  Probiotic Product (ALIGN) 4 MG CAPS Take 1 capsule (4 mg total) by mouth daily. 09/27/13   Zannie Cove, MD  rivastigmine (EXELON) 4.6 mg/24hr Place 1 patch onto the skin daily.     Historical Provider, MD  rosuvastatin (CRESTOR) 10 MG tablet Take 1 tablet (10 mg total) by mouth at bedtime. 01/19/14   Tiffany L Reed, DO  Wheat Dextrin (BENEFIBER) POWD Take by mouth as needed.    Historical Provider, MD   SpO2 98%  LMP 08/22/2011 Physical Exam  Nursing note and vitals reviewed. Constitutional: She appears well-developed and well-nourished. She appears lethargic. No distress.  HENT:  Head: Normocephalic and atraumatic.  Mouth/Throat: Oropharynx  is clear and moist. No oropharyngeal exudate.  Eyes: Conjunctivae are normal. Pupils are equal, round, and reactive to light. No scleral icterus.  Neck: Neck supple. No thyromegaly present.  Cardiovascular: Normal rate and intact distal pulses.  An irregular rhythm present.  Pulmonary/Chest: Effort normal and breath sounds normal. No respiratory distress. She has no wheezes. She has no rales. She exhibits no tenderness.  Abdominal: Soft. There is no hepatosplenomegaly. There is no tenderness. There is no rigidity, no rebound, no guarding, no CVA tenderness, no tenderness at McBurney's point and negative Murphy's sign.  Musculoskeletal:       Left wrist: She exhibits tenderness and swelling.  Left wrist is erythematous, hot to the touch and very painful to movement  Lymphadenopathy:    She has no cervical adenopathy.  Neurological: She appears lethargic. She is disoriented. Coordination abnormal. GCS eye subscore is 3. GCS verbal subscore is 4. GCS motor subscore is 6.  Skin: Skin is warm and dry. She is not diaphoretic. There is erythema.     Psychiatric: Her affect is blunt.    ED Course  Procedures (including critical care time) Labs Review Labs Reviewed  CBC WITH DIFFERENTIAL - Abnormal; Notable for the following:    Neutrophils  Relative % 79 (*)    Lymphocytes Relative 9 (*)    All other components within normal limits  COMPREHENSIVE METABOLIC PANEL - Abnormal; Notable for the following:    Sodium 135 (*)    Potassium 3.4 (*)    Chloride 95 (*)    Glucose, Bld 127 (*)    Albumin 3.2 (*)    GFR calc non Af Amer 78 (*)    Anion gap 16 (*)    All other components within normal limits  URINALYSIS, ROUTINE W REFLEX MICROSCOPIC - Abnormal; Notable for the following:    Color, Urine AMBER (*)    Ketones, ur 15 (*)    All other components within normal limits  SEDIMENTATION RATE - Abnormal; Notable for the following:    Sed Rate 98 (*)    All other components within normal limits  CULTURE, BLOOD (ROUTINE X 2)  CULTURE, BLOOD (ROUTINE X 2)  URINE CULTURE  C-REACTIVE PROTEIN  I-STAT CG4 LACTIC ACID, ED    Imaging Review Dg Chest 2 View  06/08/2014   CLINICAL DATA:  Hypertension.  EXAM: CHEST  2 VIEW  COMPARISON:  Chest radiograph 09/23/2013  FINDINGS: Stable cardiac contours. Marked tortuosity of the thoracic aorta. Calcified granuloma left lower lung. No large consolidative pulmonary opacity. No pleural effusion or pneumothorax. Mid thoracic spine degenerative change.  IMPRESSION: No acute cardiopulmonary process.   Electronically Signed   By: Annia Belt M.D.   On: 06/08/2014 17:52   Dg Wrist Complete Left  06/08/2014   CLINICAL DATA:  LEFT wrist swelling and redness without injury  EXAM: LEFT WRIST - COMPLETE 3+ VIEW  COMPARISON:  None  FINDINGS: Osseous demineralization.  Joint space narrowing at first [facility] joint and STT joint.  No acute fracture, dislocation or bone destruction.  Minimal chondrocalcinosis at triangular fibrocartilage complex.  Mild soft tissue swelling particularly at the dorsal ulnar margin of the RIGHT wrist.  IMPRESSION: Degenerative changes at the radial border of the carpus with question CPPD.  Osseous demineralization and soft tissue swelling.  No acute abnormalities.   Electronically Signed    By: Ulyses Southward M.D.   On: 06/08/2014 17:52     EKG Interpretation None      MDM  Final diagnoses:  Altered mental status, unspecified altered mental status type  Fever, unspecified fever cause  Wrist swelling, left   Consider infectious process vs neurological etiology for AMS.  CBC, I stat Lactic and CMP without significant abnormality. Sed rate elevated. CXR, CT head, Rt Wrist xray negative for acute abnormalities.  UA normal except for ketones, IVF given.  Due to fever without clear source and altered mental status, pt will need to be admitted.  Case discussed with Dr. Hyacinth Meeker who agrees with disposition and discussed with Medicine who accepted for admission.  Ortho consulted regarding wrist swelling.    Filed Vitals:   06/08/14 1952 06/08/14 2035 06/08/14 2200 06/08/14 2300  BP: 180/89  181/90 169/81  Pulse: 87  89 85  Temp:  100.7 F (38.2 C)    TempSrc:  Rectal    Resp: Weight:      SpO2: 92%  95% 94%     Harle Battiest, NP 06/09/14 [phone]

## 2014-06-08 NOTE — ED Notes (Signed)
Pt incontinent large amt urine, cleaned/brief changed and pt repositioned.  Pt denies further needs/complaints at this time.  Family remains at bedside.  NAD.

## 2014-06-08 NOTE — ED Provider Notes (Signed)
80 year old female, lives with family members, they state that she has not been out of bed in one week, she presents with pain all over her body including shoulders, especially her left wrist and generalized weakness with decreased level of consciousness. On exam the patient is a soft abdomen, clear heart and lung sounds, compartments are soft, joints are supple except for the left  wrist which is very tender, very swollen, erythematous and warm. Family report she had a fever of 101 2 days ago, 100 yesterday, urinalysis sent to her family doctor's office but no results reported at this time. There has been no vomiting, no diarrhea, no coughing. On exam the patient does appear to have decreased level of consciousness and most questions are answered to the family members though she does appear to be sleepy she is not in any acute distress until her wrist is moved on the left. Proceed with labs, imaging, rectal temperature, possible septic joint, sedimentation rate, CRP, white blood cell count pending.  Labs show sedimentation rate elevation, normal white blood cell count, x-rays show normal CAT scan of the head, likely component of a crystal arthropathy of the left wrist. Discussed with the hospitalist who will admit. Temperature has presented with a fever of 100.7 by rectal route, on repeat exam the patient is in no distress, no tachycardia, blood pressure slightly elevated and lower course of medication. Holding orders written.  Dr. Darrelyn Hillock will see pt in AM - recommends Prednisone dose pak.  Medical screening examination/treatment/procedure(s) were conducted as a shared visit with non-physician practitioner(s) and myself.  I personally evaluated the patient during the encounter.  Clinical Impression:    #1 encephalopathy  #2 hypertension  #3 swelling of the left wrist     Vida Roller, MD 06/09/14 1043

## 2014-06-08 NOTE — ED Notes (Signed)
Tysinger, NP made aware of patient CG4 Lactic results.

## 2014-06-08 NOTE — ED Notes (Signed)
In and out done by Kendal Hymen and I

## 2014-06-08 NOTE — ED Notes (Signed)
Initial Contact - pt awake, alert, at baseline per family, denies pain when asked but yelling "ow" with palpation of LUE.  Redness and mild swelling noted to L wrist with some redness and what appears to be scratches to L upper inner arm.  +csm/+pulses.  Skin otherwise PWD.  Speaking full sentences, rr even/un-lab.  Abd s/nt/nd, obese.  MAEI.  Placed to cardiac/02 monitor.  NAD. EDPA at bedside for eval.

## 2014-06-08 NOTE — ED Notes (Signed)
Bed: ZO10 Expected date:  Expected time:  Means of arrival:  Comments: 80 y/o AMS

## 2014-06-08 NOTE — ED Notes (Signed)
Pt to CT

## 2014-06-08 NOTE — ED Notes (Signed)
PER EMS  - pt from home, lives with daughter, hx dementia, at baseline, c/o L ear pain, rash to LUE and possible UTI for days.  Awake, alert.

## 2014-06-08 NOTE — ED Notes (Signed)
Pt to radiology.

## 2014-06-08 NOTE — ED Notes (Signed)
Dr. Hyacinth Meeker aware of BP, still wants labetalol given.

## 2014-06-08 NOTE — Telephone Encounter (Signed)
Patient daughter, [name] notified and appointment scheduled for tomorrow at 10:00 with Dr. Renato Gails.

## 2014-06-08 NOTE — H&P (Addendum)
PCP:   Bufford Spikes, DO   Chief Complaint:  weakness  HPI: 80 yo female h/o moderate dementia, htn, bipolar comes in lives with daughter with one week of pregressive worsening weakness and not feeling well, more confused than normal.  dtr reports low grade temp for several days.  No rashes except left wrist is swollen, red and seems to be painful which in new in the last 2 days.  No n/v/d.  No cough.  No swelling elsewhere.  She normally walks and most of the time knows who her dtr is, but not recently.  Concern is for a septic joint.  Pt with no recent iv in arm or hospitalizations.  Review of Systems:  Positive and negative as per HPI otherwise all other systems are negative per dtr  Past Medical History: Past Medical History  Diagnosis Date  . Hypertension   . High cholesterol   . Mitral valve prolapse   . Dementia   . Parkinson's disease, Lewy body   . Unspecified constipation   . Onychia and paronychia of toe   . Spasm of muscle   . Abnormality of gait   . Orthostatic hypotension   . Major depressive disorder, single episode, unspecified   . Diaphragmatic hernia without mention of obstruction or gangrene   . Alzheimer's disease   . Osteoarthrosis, unspecified whether generalized or localized, unspecified site   . Osteoporosis, unspecified   . Unspecified constipation   . Sprain of ribs   . Delirium due to conditions classified elsewhere   . Diarrhea   . Edema   . Pain in joint, pelvic region and thigh   . Other malaise and fatigue   . Urinary frequency [id number]  . Personal history of fall   . Female stress incontinence   . Other and unspecified hyperlipidemia   . Obesity, unspecified   . Osteoarthrosis, unspecified whether generalized or localized, unspecified site   . Pain in joint, shoulder region   . Transient ischemic attack (TIA), and cerebral infarction without residual deficits(V12.54)    Past Surgical History  Procedure Laterality Date  . Joint  replacement    . Tonsillectomy    . Ankle surgery Left 1998  . Total knee arthroplasty Right 2002  . Total knee arthroplasty Left 2005  . Hip arthroplasty Right 09/24/2013    Procedure: ARTHROPLASTY BIPOLAR HIP;  Surgeon: Loanne Drilling, MD;  Location: [facility];  Service: Orthopedics;  Laterality: Right;    Medications: Prior to Admission medications   Medication Sig Start Date End Date Taking? Authorizing Provider  acetaminophen (TYLENOL) 650 MG CR tablet Take 650 mg by mouth. Take 650 mg by mouth every 8 hours formulation   Yes Historical Provider, MD  albuterol (ACCUNEB) 1.25 MG/3ML nebulizer solution Take 3 mLs (1.25 mg total) by nebulization every 6 (six) hours as needed for wheezing. 03/28/14  Yes Oneal Grout, MD  Ascorbic Acid (VITAMIN C WITH ROSE HIPS) 1000 MG tablet Take 1,000 mg by mouth daily.     Yes Historical Provider, MD  calcium carbonate (OS-CAL) 600 MG TABS tablet Take 600 mg by mouth 2 (two) times daily with a meal.   Yes Historical Provider, MD  Cholecalciferol (VITAMIN D3) 2000 UNITS TABS Take 2,000 Units by mouth daily.    Yes Historical Provider, MD  citalopram (CELEXA) 40 MG tablet Take 40 mg by mouth. 1/2 by mouth daily   Yes Historical Provider, MD  clopidogrel (PLAVIX) 75 MG tablet Take one tablet by mouth once daily  to help circulation 01/19/14  Yes Tiffany L Reed, DO  docusate sodium (COLACE) 100 MG capsule Take 100 mg by mouth daily.    Yes Historical Provider, MD  feeding supplement (BOOST HIGH PROTEIN) LIQD Take 1 Container by mouth daily.   Yes Historical Provider, MD  fluticasone (CUTIVATE) 0.05 % cream Apply 1 application topically 2 (two) times daily. To affected areas as needed for skin irritaion 11/30/13  Yes Claudie Revering, NP  folic acid (FOLVITE) 400 MCG tablet Take 400 mcg by mouth daily.   Yes Historical Provider, MD  Guaifenesin (MUCINEX MAXIMUM STRENGTH) 1200 MG TB12 Take by mouth 2 (two) times daily.   Yes Historical Provider, MD  Memantine HCl ER  (NAMENDA XR) 28 MG CP24 Take 28 mg by mouth at bedtime. Take one tablet once a day to preserve memory 01/19/14  Yes Tiffany L Reed, DO  mirabegron ER (MYRBETRIQ) 50 MG TB24 tablet Take 1 tablet (50 mg total) by mouth daily. 12/15/13  Yes Tiffany L Reed, DO  Multiple Vitamins-Minerals (CENTRUM SILVER ULTRA WOMENS PO) Take 1 tablet by mouth every morning.     Yes Historical Provider, MD  nystatin (MYCOSTATIN/NYSTOP) 100000 UNIT/GM POWD 2 times a day for 2 weeks in LT auxillia 12/15/13  Yes Tiffany L Reed, DO  omega-3 acid ethyl esters (LOVAZA) 1 G capsule Take 2 capsules (2 g total) by mouth 2 (two) times daily. 01/19/14  Yes Tiffany L Reed, DO  omeprazole (PRILOSEC) 20 MG capsule Take one capsule by mouth every day to reduce stomach acid secretion 01/19/14  Yes Tiffany L Reed, DO  Probiotic Product (ALIGN) 4 MG CAPS Take 1 capsule (4 mg total) by mouth daily. 09/27/13  Yes Zannie Cove, MD  rosuvastatin (CRESTOR) 10 MG tablet Take 1 tablet (10 mg total) by mouth at bedtime. 01/19/14  Yes Tiffany L Reed, DO  Wheat Dextrin (BENEFIBER) POWD Take by mouth See admin instructions. Use one capful with liquid as needed for constipation   Yes Historical Provider, MD  haloperidol (HALDOL) 0.5 MG tablet Take one tablet by mouth once daily as needed for agitation/psychosis 06/06/14   Tiffany L Reed, DO  rivastigmine (EXELON) 4.6 mg/24hr Place 1 patch onto the skin daily.     Historical Provider, MD    Allergies:   Allergies  Allergen Reactions  . Cephalexin Nausea And Vomiting  . Sulfa Antibiotics Other (See Comments)    Daughter unsure of reaction, but believes it is severe  . Sulfites     unknown    Social History:  reports that she has never smoked. She does not have any smokeless tobacco history on file. She reports that she does not drink alcohol or use illicit drugs.  Family History: Family History  Problem Relation Age of Onset  . Adopted: Yes    Physical Exam: Filed Vitals:   06/08/14 1800  06/08/14 1952 06/08/14 2035 06/08/14 2200  BP: 188/69 180/89  181/90  Pulse: 79 87  89  Temp:   100.7 F (38.2 C)   TempSrc:   Rectal   Resp: Weight:      SpO2: 96% 92%  95%   General appearance: alert, mild distress and slowed mentation Head: Normocephalic, without obvious abnormality, atraumatic Eyes: negative Nose: Nares normal. Septum midline. Mucosa normal. No drainage or sinus tenderness. Neck: no JVD and supple, symmetrical, trachea midline Lungs: clear to auscultation bilaterally Heart: regular rate and rhythm, S1, S2 normal, no murmur, click, rub or gallop  Abdomen: soft, non-tender; bowel sounds normal; no masses,  no organomegaly Extremities: extremities normal, atraumatic, no cyanosis or edema  X left wrist swollen, red, tender Pulses: 2+ and symmetric Skin: Skin color, texture, turgor normal. No rashes or lesions Neurologic: Grossly normal   Labs on Admission:   Recent Labs  06/08/14 1642  NA 135*  K 3.4*  CL 95*  CO2 24  GLUCOSE 127*  BUN 15  CREATININE 0.68  CALCIUM 9.5    Recent Labs  06/08/14 1642  AST 28  ALT 12  ALKPHOS 52  BILITOT 1.1  PROT 7.5  ALBUMIN 3.2*    Recent Labs  06/08/14 1642  WBC 9.5  NEUTROABS 7.5  HGB 13.8  HCT 40.9  MCV 98.3  PLT 177   Radiological Exams on Admission: Dg Chest 2 View  06/08/2014   CLINICAL DATA:  Hypertension.  EXAM: CHEST  2 VIEW  COMPARISON:  Chest radiograph 09/23/2013  FINDINGS: Stable cardiac contours. Marked tortuosity of the thoracic aorta. Calcified granuloma left lower lung. No large consolidative pulmonary opacity. No pleural effusion or pneumothorax. Mid thoracic spine degenerative change.  IMPRESSION: No acute cardiopulmonary process.   Electronically Signed   By: Annia Belt M.D.   On: 06/08/2014 17:52   Dg Wrist Complete Left  06/08/2014   CLINICAL DATA:  LEFT wrist swelling and redness without injury  EXAM: LEFT WRIST - COMPLETE 3+ VIEW  COMPARISON:  None  FINDINGS: Osseous  demineralization.  Joint space narrowing at first [facility] joint and STT joint.  No acute fracture, dislocation or bone destruction.  Minimal chondrocalcinosis at triangular fibrocartilage complex.  Mild soft tissue swelling particularly at the dorsal ulnar margin of the RIGHT wrist.  IMPRESSION: Degenerative changes at the radial border of the carpus with question CPPD.  Osseous demineralization and soft tissue swelling.  No acute abnormalities.   Electronically Signed   By: Ulyses Southward M.D.   On: 06/08/2014 17:52   Ct Head Wo Contrast  06/08/2014   CLINICAL DATA:  severe Headache  EXAM: CT HEAD WITHOUT CONTRAST  TECHNIQUE: Contiguous axial images were obtained from the base of the skull through the vertex without intravenous contrast.  COMPARISON:  10/18/2011  FINDINGS: Retention cyst or polyp in the left maxillary sinus. Atherosclerotic and physiologic intracranial calcifications. Diffuse parenchymal atrophy. Patchy areas of hypoattenuation in deep and periventricular white matter bilaterally. Negative for acute intracranial hemorrhage, mass lesion, acute infarction, midline shift, or mass-effect. Acute infarct may be inapparent on noncontrast CT. Ventricles and sulci symmetric. Bone windows demonstrate no focal lesion.  IMPRESSION: 1. Negative for bleed or other acute intracranial process. 2. Stable atrophy and nonspecific white matter changes.   Electronically Signed   By: Oley Balm M.D.   On: 06/08/2014 19:26    Assessment/Plan  80 yo female with generalized weakness , confusion and swollen left wrist  Principal Problem:   Swollen joint-  Possible septic joint, ortho called, abx per ortho team as discussion with edp. Pt is stable, they may want to wait to get aspirate before starting abx, think it is safe to wait til am.  Provide some pain meds prn, and check uric acid level.  Active Problems:  Stable unless o/w noted   Parkinson's disease, Lewy body   Hypertension   Dementia   Bipolar I  disorder, most recent episode (or current) manic   Generalized weakness   Confusion  Pt is DNR.  No cpr no intubation, no feeding tubes ever in the future.  dtr agreeable to ivf and abx if needed.    Sallee Hogrefe A 06/08/2014, 10:58 PM  predpack per ortho recs until am.

## 2014-06-09 ENCOUNTER — Ambulatory Visit: Payer: Self-pay | Admitting: Internal Medicine

## 2014-06-09 DIAGNOSIS — M2540 Effusion, unspecified joint: Secondary | ICD-10-CM | POA: Diagnosis not present

## 2014-06-09 DIAGNOSIS — Z7902 Long term (current) use of antithrombotics/antiplatelets: Secondary | ICD-10-CM | POA: Diagnosis not present

## 2014-06-09 DIAGNOSIS — G309 Alzheimer's disease, unspecified: Secondary | ICD-10-CM | POA: Diagnosis present

## 2014-06-09 DIAGNOSIS — G3183 Dementia with Lewy bodies: Secondary | ICD-10-CM

## 2014-06-09 DIAGNOSIS — Z96649 Presence of unspecified artificial hip joint: Secondary | ICD-10-CM | POA: Diagnosis not present

## 2014-06-09 DIAGNOSIS — F0280 Dementia in other diseases classified elsewhere without behavioral disturbance: Secondary | ICD-10-CM

## 2014-06-09 DIAGNOSIS — M1990 Unspecified osteoarthritis, unspecified site: Secondary | ICD-10-CM | POA: Diagnosis present

## 2014-06-09 DIAGNOSIS — F0391 Unspecified dementia with behavioral disturbance: Secondary | ICD-10-CM

## 2014-06-09 DIAGNOSIS — Z6832 Body mass index (BMI) 32.0-32.9, adult: Secondary | ICD-10-CM | POA: Diagnosis not present

## 2014-06-09 DIAGNOSIS — I5030 Unspecified diastolic (congestive) heart failure: Secondary | ICD-10-CM | POA: Diagnosis not present

## 2014-06-09 DIAGNOSIS — E785 Hyperlipidemia, unspecified: Secondary | ICD-10-CM | POA: Diagnosis present

## 2014-06-09 DIAGNOSIS — I509 Heart failure, unspecified: Secondary | ICD-10-CM | POA: Diagnosis present

## 2014-06-09 DIAGNOSIS — M436 Torticollis: Secondary | ICD-10-CM | POA: Diagnosis present

## 2014-06-09 DIAGNOSIS — F319 Bipolar disorder, unspecified: Secondary | ICD-10-CM | POA: Diagnosis present

## 2014-06-09 DIAGNOSIS — IMO0002 Reserved for concepts with insufficient information to code with codable children: Secondary | ICD-10-CM | POA: Diagnosis present

## 2014-06-09 DIAGNOSIS — M6281 Muscle weakness (generalized): Secondary | ICD-10-CM | POA: Diagnosis not present

## 2014-06-09 DIAGNOSIS — F0390 Unspecified dementia without behavioral disturbance: Secondary | ICD-10-CM | POA: Diagnosis not present

## 2014-06-09 DIAGNOSIS — Z66 Do not resuscitate: Secondary | ICD-10-CM | POA: Diagnosis present

## 2014-06-09 DIAGNOSIS — M109 Gout, unspecified: Secondary | ICD-10-CM | POA: Diagnosis present

## 2014-06-09 DIAGNOSIS — Z96659 Presence of unspecified artificial knee joint: Secondary | ICD-10-CM | POA: Diagnosis not present

## 2014-06-09 DIAGNOSIS — Z79899 Other long term (current) drug therapy: Secondary | ICD-10-CM | POA: Diagnosis not present

## 2014-06-09 DIAGNOSIS — G20 Parkinson's disease: Secondary | ICD-10-CM | POA: Diagnosis not present

## 2014-06-09 DIAGNOSIS — I10 Essential (primary) hypertension: Secondary | ICD-10-CM | POA: Diagnosis not present

## 2014-06-09 DIAGNOSIS — M810 Age-related osteoporosis without current pathological fracture: Secondary | ICD-10-CM | POA: Diagnosis present

## 2014-06-09 DIAGNOSIS — R262 Difficulty in walking, not elsewhere classified: Secondary | ICD-10-CM | POA: Diagnosis not present

## 2014-06-09 DIAGNOSIS — Z8673 Personal history of transient ischemic attack (TIA), and cerebral infarction without residual deficits: Secondary | ICD-10-CM | POA: Diagnosis not present

## 2014-06-09 DIAGNOSIS — I5031 Acute diastolic (congestive) heart failure: Secondary | ICD-10-CM | POA: Diagnosis not present

## 2014-06-09 DIAGNOSIS — I059 Rheumatic mitral valve disease, unspecified: Secondary | ICD-10-CM | POA: Diagnosis not present

## 2014-06-09 DIAGNOSIS — R5381 Other malaise: Secondary | ICD-10-CM | POA: Diagnosis not present

## 2014-06-09 DIAGNOSIS — E669 Obesity, unspecified: Secondary | ICD-10-CM | POA: Diagnosis present

## 2014-06-09 DIAGNOSIS — F03918 Unspecified dementia, unspecified severity, with other behavioral disturbance: Secondary | ICD-10-CM

## 2014-06-09 LAB — CBC
HEMATOCRIT: 38.3 % (ref 36.0–46.0)
Hemoglobin: 12.6 g/dL (ref 12.0–15.0)
MCH: 32.5 pg (ref 26.0–34.0)
MCHC: 32.9 g/dL (ref 30.0–36.0)
MCV: 98.7 fL (ref 78.0–100.0)
PLATELETS: 150 10*3/uL (ref 150–400)
RBC: 3.88 MIL/uL (ref 3.87–5.11)
RDW: 12.1 % (ref 11.5–15.5)
WBC: 9 10*3/uL (ref 4.0–10.5)

## 2014-06-09 LAB — BASIC METABOLIC PANEL
Anion gap: 16 — ABNORMAL HIGH (ref 5–15)
BUN: 8 mg/dL (ref 6–23)
CO2: 22 mEq/L (ref 19–32)
Calcium: 8.4 mg/dL (ref 8.4–10.5)
Chloride: 100 mEq/L (ref 96–112)
Creatinine, Ser: 0.57 mg/dL (ref 0.50–1.10)
GFR calc Af Amer: >90 mL/min (ref >90)
GFR, EST NON AFRICAN AMERICAN: 83 mL/min — AB (ref >90)
GLUCOSE: 106 mg/dL — AB (ref 70–99)
POTASSIUM: 3.1 meq/L — AB (ref 3.7–5.3)
Sodium: 138 mEq/L (ref 137–147)

## 2014-06-09 LAB — URIC ACID: Uric Acid, Serum: 4.2 mg/dL (ref 2.4–7.0)

## 2014-06-09 LAB — MRSA PCR SCREENING: MRSA by PCR: POSITIVE — AB

## 2014-06-09 LAB — PRO B NATRIURETIC PEPTIDE: Pro B Natriuretic peptide (BNP): 4261 pg/mL — ABNORMAL HIGH (ref 0–450)

## 2014-06-09 LAB — C-REACTIVE PROTEIN: CRP: 15.6 mg/dL — AB (ref <0.60)

## 2014-06-09 MED ORDER — ACETAMINOPHEN 325 MG PO TABS: 650.0000 mg | ORAL_TABLET | Freq: Four times a day (QID) | ORAL | Status: DC | PRN

## 2014-06-09 MED ORDER — POTASSIUM CHLORIDE IN NACL 20-0.9 MEQ/L-% IV SOLN
INTRAVENOUS | Status: AC
  Administered 2014-06-09: 04:00:00 via INTRAVENOUS
  Filled 2014-06-09: qty 1000

## 2014-06-09 MED ORDER — ONDANSETRON HCL 4 MG/2ML IJ SOLN: 4.0000 mg | Freq: Three times a day (TID) | INTRAMUSCULAR | Status: AC | PRN

## 2014-06-09 MED ORDER — CLINDAMYCIN PHOSPHATE 300 MG/50ML IV SOLN
300.0000 mg | Freq: Three times a day (TID) | INTRAVENOUS | Status: DC
  Administered 2014-06-09 – 2014-06-11 (×6): 300 mg via INTRAVENOUS
  Filled 2014-06-09 (×7): qty 50

## 2014-06-09 MED ORDER — ONDANSETRON HCL 4 MG/2ML IJ SOLN: 4.0000 mg | Freq: Four times a day (QID) | INTRAMUSCULAR | Status: DC | PRN

## 2014-06-09 MED ORDER — ONDANSETRON HCL 4 MG PO TABS: 4.0000 mg | ORAL_TABLET | Freq: Four times a day (QID) | ORAL | Status: DC | PRN

## 2014-06-09 MED ORDER — HYDROCODONE-ACETAMINOPHEN 5-325 MG PO TABS: 1.0000 | ORAL_TABLET | ORAL | Status: DC | PRN

## 2014-06-09 MED ORDER — CYCLOBENZAPRINE HCL 5 MG PO TABS
5.0000 mg | ORAL_TABLET | ORAL | Status: AC
  Administered 2014-06-09: 5 mg via ORAL
  Filled 2014-06-09: qty 1

## 2014-06-09 MED ORDER — ACETAMINOPHEN 650 MG RE SUPP: 650.0000 mg | Freq: Four times a day (QID) | RECTAL | Status: DC | PRN

## 2014-06-09 MED ORDER — ENOXAPARIN SODIUM 40 MG/0.4ML ~~LOC~~ SOLN
40.0000 mg | SUBCUTANEOUS | Status: DC
Start: 2014-06-09 — End: 2014-06-13
  Administered 2014-06-09 – 2014-06-13 (×5): 40 mg via SUBCUTANEOUS
  Filled 2014-06-09 (×5): qty 0.4

## 2014-06-09 MED ORDER — SODIUM CHLORIDE 0.9 % IV SOLN: INTRAVENOUS | Status: AC

## 2014-06-09 MED ORDER — CYCLOBENZAPRINE HCL 10 MG PO TABS
5.0000 mg | ORAL_TABLET | Freq: Three times a day (TID) | ORAL | Status: DC | PRN
  Administered 2014-06-10 – 2014-06-12 (×3): 5 mg via ORAL
  Filled 2014-06-09 (×3): qty 1

## 2014-06-09 MED ORDER — PREDNISONE 20 MG PO TABS
40.0000 mg | ORAL_TABLET | Freq: Every day | ORAL | Status: DC
  Administered 2014-06-09 – 2014-06-11 (×3): 40 mg via ORAL
  Filled 2014-06-09 (×4): qty 2

## 2014-06-09 MED ORDER — HYDROMORPHONE HCL PF 1 MG/ML IJ SOLN: 0.5000 mg | INTRAMUSCULAR | Status: DC | PRN

## 2014-06-09 NOTE — ED Provider Notes (Signed)
Medical screening examination/treatment/procedure(s) were conducted as a shared visit with non-physician practitioner(s) and myself.  I personally evaluated the patient during the encounter  Please see my separate respective documentation pertaining to this patient encounter   Vida Roller, MD 06/09/14 1042

## 2014-06-09 NOTE — Progress Notes (Signed)
PROGRESS NOTE  Holly Grimes IEP:[id number] DOB: Mar 12, 1927 DOA: 06/08/2014 PCP: Bufford Spikes, DO  HPI/Recap of past 24 hours: Patient is an 80 year old white female past medical history of Parkinson's dementia and hypertension is from home with 24-hour care and was noted to have some pain and erythema of her left wrist. According to her family, she also looked to be more confused than her baseline. Patient brought in a evaluated by hospitalist service. See the orthopedic surgery who felt that this possibly could be inflammatory arthropathy, however patient ESR and sedimentation rate were quite elevated and they recommended antibiotic treatment as well.  Patient seen this afternoon. Caregiver at bedside. Patient has kept her head turned to the right side and attempts to try to get her to reposition cause her pain. This is to be in acute pain that started at admission  Assessment/Plan: Principal Problem:   Swollen joint: We'll treat as cellulitis plus/minus inflammatory arthropathy. Active Problems:   Hypertension: Patient noted to have some elevated blood pressures. On home medications. We'll check BNP to ensure that she does not have underlying undiagnosed volume overload.    Parkinson's Dementia with acute confusion: Suspect secondary to arthropathy/cellulitis. As above treat.    Obesity (BMI 30-39.9): Patient needs criteria with BMI greater than 30    Bipolar I disorder, most recent episode (or current) manic   Generalized weakness    Acute torticollis: Looks to be an acute new finding. Am not suspecting anything more malignant such as a fracture. She is able to turn her head somewhat with resistance in pain, but is able to do so. Will use muscle relaxers   Code Status:  DO NOT RESUSCITATE  Family Communication:  Spoke with patient's caregiver nurse at the bedside  Disposition Plan:  Would like to see torticollis improved and decreased pain and erythema and was before discharge.  Hoping to discharge tomorrow   Consultants:   Hand surgery  Procedures:   None  Antibiotics:   IV clindamycin 9/4-present  HPI/Subjective:  Patient does okay. With prompting, she states her neck hurts, especially when we tried to turn it. She states that she sees to many doctors  Objective: BP 146/75  Pulse 80  Temp(Src) 98.5 F (36.9 C) (Oral)  Resp 16  Ht 5\' 2"  (1.575 m)  Wt 81 kg (178 lb 9.2 oz)  BMI 32.65 kg/m2  SpO2 96%  LMP 08/22/2011  Intake/Output Summary (Last 24 hours) at 06/09/14 1613 Last data filed at 06/09/14 1401  Gross per 24 hour  Intake     30 ml  Output      0 ml  Net     30 ml   Filed Weights   06/08/14 1612 06/09/14 0252  Weight: 82.6 kg (182 lb 1.6 oz) 81 kg (178 lb 9.2 oz)    Exam:   General:  Alert and oriented x1, no acute distress, patient head is angled and turned toward the right  Cardiovascular:  Sinus arrhythmia with occasional dropped beats.  Respiratory:  Clear to auscultation bilaterally  Abdomen:  Soft, nontender, nondistended, positive bowel sounds  Musculoskeletal:  Trace edema. Left wrist is tender as is area at base of thumb.   Data Reviewed: Basic Metabolic Panel:  Recent Labs Lab 06/08/14 1642 06/09/14 0530  NA 135* 138  K 3.4* 3.1*  CL 95* 100  CO2 24 22  GLUCOSE 127* 106*  BUN 15 8  CREATININE 0.68 0.57  CALCIUM 9.5 8.4   Liver Function Tests:  Recent Labs Lab 06/08/14 1642  AST 28  ALT 12  ALKPHOS 52  BILITOT 1.1  PROT 7.5  ALBUMIN 3.2*   No results found for this basename: LIPASE, AMYLASE,  in the last 168 hours No results found for this basename: AMMONIA,  in the last 168 hours CBC:  Recent Labs Lab 06/08/14 1642 06/09/14 0530  WBC 9.5 9.0  NEUTROABS 7.5  --   HGB 13.8 12.6  HCT 40.9 38.3  MCV 98.3 98.7  PLT 177 150   Cardiac Enzymes:   No results found for this basename: CKTOTAL, CKMB, CKMBINDEX, TROPONINI,  in the last 168 hours BNP (last 3 results) No results found for  this basename: PROBNP,  in the last 8760 hours CBG: No results found for this basename: GLUCAP,  in the last 168 hours  Recent Results (from the past 240 hour(s))  URINE CULTURE     Status: None   Collection Time    06/06/14  5:22 PM      Result Value Ref Range Status   Urine Culture, Routine Final report   Final   Result 1 Comment   Final   Comment: Mixed urogenital flora     Greater than 100,000 colony forming units per mL  CULTURE, BLOOD (ROUTINE X 2)     Status: None   Collection Time    06/08/14  4:42 PM      Result Value Ref Range Status   Specimen Description BLOOD RIGHT HAND   Final   Special Requests BOTTLES DRAWN AEROBIC AND ANAEROBIC 6 CC   Final   Culture  Setup Time     Final   Value: 06/08/2014 22:00     Performed at [facility]   Culture     Final   Value:        BLOOD CULTURE RECEIVED NO GROWTH TO DATE CULTURE WILL BE HELD FOR 5 DAYS BEFORE ISSUING A FINAL NEGATIVE REPORT     Performed at [facility]   Report Status PENDING   Incomplete  CULTURE, BLOOD (ROUTINE X 2)     Status: None   Collection Time    06/08/14  4:42 PM      Result Value Ref Range Status   Specimen Description BLOOD RIGHT WRIST   Final   Special Requests BOTTLES DRAWN AEROBIC AND ANAEROBIC 6 CC   Final   Culture  Setup Time     Final   Value: 06/08/2014 21:59     Performed at [facility]   Culture     Final   Value:        BLOOD CULTURE RECEIVED NO GROWTH TO DATE CULTURE WILL BE HELD FOR 5 DAYS BEFORE ISSUING A FINAL NEGATIVE REPORT     Performed at [facility]   Report Status PENDING   Incomplete  MRSA PCR SCREENING     Status: Abnormal   Collection Time    06/09/14  4:47 AM      Result Value Ref Range Status   MRSA by PCR POSITIVE (*) NEGATIVE Final   Comment:            The GeneXpert MRSA Assay (FDA     approved for NASAL specimens     only), is one component of a     comprehensive MRSA colonization     surveillance program. It is not      intended to diagnose MRSA     infection nor to guide or  monitor treatment for     MRSA infections.     RESULT CALLED TO, READ BACK BY AND VERIFIED WITH:     N. MAYFIELD RN AT [phone] ON 09.04.15 BY SHUEA     Studies: Dg Chest 2 View  06/08/2014   CLINICAL DATA:  Hypertension.  EXAM: CHEST  2 VIEW  COMPARISON:  Chest radiograph 09/23/2013  FINDINGS: Stable cardiac contours. Marked tortuosity of the thoracic aorta. Calcified granuloma left lower lung. No large consolidative pulmonary opacity. No pleural effusion or pneumothorax. Mid thoracic spine degenerative change.  IMPRESSION: No acute cardiopulmonary process.   Electronically Signed   By: Lovey Newcomer M.D.   On: 06/08/2014 17:52   Dg Wrist Complete Left  06/08/2014   CLINICAL DATA:  LEFT wrist swelling and redness without injury  EXAM: LEFT WRIST - COMPLETE 3+ VIEW  COMPARISON:  None  FINDINGS: Osseous demineralization.  Joint space narrowing at first [facility] joint and STT joint.  No acute fracture, dislocation or bone destruction.  Minimal chondrocalcinosis at triangular fibrocartilage complex.  Mild soft tissue swelling particularly at the dorsal ulnar margin of the RIGHT wrist.  IMPRESSION: Degenerative changes at the radial border of the carpus with question CPPD.  Osseous demineralization and soft tissue swelling.  No acute abnormalities.   Electronically Signed   By: Lavonia Dana M.D.   On: 06/08/2014 17:52   Ct Head Wo Contrast  06/08/2014   CLINICAL DATA:  severe Headache  EXAM: CT HEAD WITHOUT CONTRAST  TECHNIQUE: Contiguous axial images were obtained from the base of the skull through the vertex without intravenous contrast.  COMPARISON:  10/18/2011  FINDINGS: Retention cyst or polyp in the left maxillary sinus. Atherosclerotic and physiologic intracranial calcifications. Diffuse parenchymal atrophy. Patchy areas of hypoattenuation in deep and periventricular white matter bilaterally. Negative for acute intracranial hemorrhage, mass lesion,  acute infarction, midline shift, or mass-effect. Acute infarct may be inapparent on noncontrast CT. Ventricles and sulci symmetric. Bone windows demonstrate no focal lesion.  IMPRESSION: 1. Negative for bleed or other acute intracranial process. 2. Stable atrophy and nonspecific white matter changes.   Electronically Signed   By: Arne Cleveland M.D.   On: 06/08/2014 19:26    Scheduled Meds: . sodium chloride   Intravenous STAT  . clindamycin (CLEOCIN) IV  300 mg Intravenous 3 times per day  . cyclobenzaprine  5 mg Oral NOW  . enoxaparin (LOVENOX) injection  40 mg Subcutaneous Q24H    Continuous Infusions:    Time spent: 25 minutes  [facility] Pager [phone]. If 7PM-7AM, please contact night-coverage at [email or web address], password [facility] 06/09/2014, 4:13 PM  LOS: 1 day

## 2014-06-09 NOTE — Progress Notes (Signed)
UR Completed.  Adia Crammer Jane [phone] 06/09/2014  

## 2014-06-09 NOTE — ED Notes (Signed)
Family at bedside. 

## 2014-06-09 NOTE — Consult Note (Signed)
Reason for Consult:  Left Wrist Pain Referring Physician: Dr. Derrill Kay  Holly Grimes is an 80 y.o. female.  HPI: Patient is an 80 year old female who is familiar with Dr. Wynelle Link having underwent surgery back on December 20th for a right femoral neck fracture.  Dr. Wynelle Link performed a right hip hemiarthroplasty on the patient.  Following her surgery, she was sent to [facility] as per the daughter and then eventually home with family.  She has been receiving Home Health therapy for quite some time.  The patient has known dementia but she does recognize her daughter on a daily basis.  The patient's affect and her cognitive baseline worsened as per the daughter over the past several days.  It was also stated that her normal fluid intake and appetite decreased since the first of the week.  Most of the history has been obtained thru the daughter who is in the room at the bedside during the visit this morning. On Tuesday evening, the daughter states that she reached out and grabbed the patient's left wrist at which time the patient yelled out in pain.  The wrist was noticed to be a little red and swollen at that time in and around the base of the thumb.  On Wednesday, the redness was also noted up near the elbow and the proximal forearm along with some scratches which were believed to be caused by the patient. Daughter states that the patient has been running low grade temperatures for the past several days despite receiving Tylenol 8 hour formula.  She had also spoken with the PCP office and urine was sent off to check for a UTI. The daughter did not know the status or the results of the urine testing at this time. She was brought in yesterday evening to the hospital for worsening cognitive function in the face of known dementia, poor po intake, increased wrist pain along with redness, and continued fevers. Dr. Gladstone Lighter was contacted last evening and it was recommended at that time to start on a Prednisone  dosepack for possible gout in the wrist.  Past Medical History  Diagnosis Date  . Hypertension   . High cholesterol   . Mitral valve prolapse   . Dementia   . Parkinson's disease, Lewy body   . Unspecified constipation   . Onychia and paronychia of toe   . Spasm of muscle   . Abnormality of gait   . Orthostatic hypotension   . Major depressive disorder, single episode, unspecified   . Diaphragmatic hernia without mention of obstruction or gangrene   . Alzheimer's disease   . Osteoarthrosis, unspecified whether generalized or localized, unspecified site   . Osteoporosis, unspecified   . Unspecified constipation   . Sprain of ribs   . Delirium due to conditions classified elsewhere   . Diarrhea   . Edema   . Pain in joint, pelvic region and thigh   . Other malaise and fatigue   . Urinary frequency [id number]  . Personal history of fall   . Female stress incontinence   . Other and unspecified hyperlipidemia   . Obesity, unspecified   . Osteoarthrosis, unspecified whether generalized or localized, unspecified site   . Pain in joint, shoulder region   . Transient ischemic attack (TIA), and cerebral infarction without residual deficits(V12.54)     Past Surgical History  Procedure Laterality Date  . Joint replacement    . Tonsillectomy    . Ankle surgery Left 1998  .  Total knee arthroplasty Right 2002  . Total knee arthroplasty Left 2005  . Hip arthroplasty Right 09/24/2013    Procedure: ARTHROPLASTY BIPOLAR HIP;  Surgeon: Gearlean Alf, MD;  Location: [facility];  Service: Orthopedics;  Laterality: Right;    Family History  Problem Relation Age of Onset  . Adopted: Yes    Social History:  reports that she has never smoked. She does not have any smokeless tobacco history on file. She reports that she does not drink alcohol or use illicit drugs.  Allergies:  Allergies  Allergen Reactions  . Cephalexin Nausea And Vomiting  . Sulfa Antibiotics Other (See Comments)     Daughter unsure of reaction, but believes it is severe  . Sulfites     unknown    Medications: acetaminophen (TYLENOL) 650 MG CR tablet Take 650 mg by mouth. (ACCUNEB) 1.25 MG/3ML nebulizer solution Take 3 mLs (1.25 mg total) by nebulization every 6 (six) hours as needed for wheezing.  Ascorbic Acid (VITAMIN C WITH ROSE HIPS) 1000 MG tablet Take 1,000 mg by mouth daily calcium carbonate (OS-CAL) 600 MG TABS tablet Take 600 mg by mouth 2 (two) times daily with a meal. Phillips Grout, MD Not Cholecalciferol (VITAMIN D3) 2000 UNITS TABS Take 2,000 Units by mouth daily. citalopram (CELEXA) 40 MG tablet Take 40 mg by mouth. 1/2 by mouth daily  clopidogrel (PLAVIX) 75 MG tablet Take one tablet by mouth once daily to help circulation   docusate sodium (COLACE) 100 MG capsule Take 100 mg by mouth daily. feeding supplement (BOOST HIGH PROTEIN) LIQD Take 1 Container by mouth daily.   fluticasone (CUTIVATE) 0.05 % cream Apply 1 application topically 2 (two) times daily. To affected areas as needed for skin irritaion  folic acid (FOLVITE) 761 MCG tablet Take 400 mcg by mouth daily.  Guaifenesin (MUCINEX MAXIMUM STRENGTH) 1200 MG TB12 Take by mouth 2 (two) times daily.  haloperidol (HALDOL) 0.5 MG tablet Take one tablet by mouth once daily as needed for agitation/psychosis  Memantine HCl ER (NAMENDA XR) 28 MG CP24 Take 28 mg by mouth at bedtime. Take one tablet once a day to preserve memory  mirabegron ER (MYRBETRIQ) 50 MG TB24 tablet Take 1 tablet (50 mg total) by mouth daily. Multiple Vitamins-Minerals (CENTRUM SILVER ULTRA WOMENS PO) Take 1 tablet by mouth every morning. nystatin (MYCOSTATIN/NYSTOP) 100000 UNIT/GM POWD 2 times a day for 2 weeks in LT auxillia   omega-3 acid ethyl esters (LOVAZA) 1 G capsule Take 2 capsules (2 g total) by mouth 2 (two) times daily omeprazole (PRILOSEC) 20 MG capsule Take one capsule by mouth every day to reduce stomach acid secretion Probiotic Product (ALIGN) 4 MG CAPS  Take 1 capsule (4 mg total) by mouth daily.  rivastigmine (EXELON) 4.6 mg/24hr Place 1 patch onto the skin daily.  rosuvastatin (CRESTOR) 10 MG tablet Take 1 tablet (10 mg total) by mouth at bedtime. Wheat Dextrin (BENEFIBER) POWD Take by mouth See admin instructions. Use one capful with liquid as needed for constipation   Results for orders placed during the hospital encounter of 06/08/14 (from the past 48 hour(s))  CULTURE, BLOOD (ROUTINE X 2)     Status: None   Collection Time    06/08/14  4:42 PM      Result Value Ref Range   Specimen Description BLOOD RIGHT HAND     Special Requests BOTTLES DRAWN AEROBIC AND ANAEROBIC 6 CC     Culture  Setup Time  Value: 06/08/2014 22:00     Performed at [facility]   Culture       Value:        BLOOD CULTURE RECEIVED NO GROWTH TO DATE CULTURE WILL BE HELD FOR 5 DAYS BEFORE ISSUING A FINAL NEGATIVE REPORT     Performed at [facility]   Report Status PENDING    CULTURE, BLOOD (ROUTINE X 2)     Status: None   Collection Time    06/08/14  4:42 PM      Result Value Ref Range   Specimen Description BLOOD RIGHT WRIST     Special Requests BOTTLES DRAWN AEROBIC AND ANAEROBIC 6 CC     Culture  Setup Time       Value: 06/08/2014 21:59     Performed at [facility]   Culture       Value:        BLOOD CULTURE RECEIVED NO GROWTH TO DATE CULTURE WILL BE HELD FOR 5 DAYS BEFORE ISSUING A FINAL NEGATIVE REPORT     Performed at [facility]   Report Status PENDING    CBC WITH DIFFERENTIAL     Status: Abnormal   Collection Time    06/08/14  4:42 PM      Result Value Ref Range   WBC 9.5  4.0 - 10.5 K/uL   RBC 4.16  3.87 - 5.11 MIL/uL   Hemoglobin 13.8  12.0 - 15.0 g/dL   HCT 40.9  36.0 - 46.0 %   MCV 98.3  78.0 - 100.0 fL   MCH 33.2  26.0 - 34.0 pg   MCHC 33.7  30.0 - 36.0 g/dL   RDW 12.1  11.5 - 15.5 %   Platelets 177  150 - 400 K/uL   Neutrophils Relative % 79 (*) 43 - 77 %   Neutro Abs 7.5  1.7 - 7.7 K/uL    Lymphocytes Relative 9 (*) 12 - 46 %   Lymphs Abs 0.9  0.7 - 4.0 K/uL   Monocytes Relative 11  3 - 12 %   Monocytes Absolute 1.0  0.1 - 1.0 K/uL   Eosinophils Relative 1  0 - 5 %   Eosinophils Absolute 0.1  0.0 - 0.7 K/uL   Basophils Relative 0  0 - 1 %   Basophils Absolute 0.0  0.0 - 0.1 K/uL  COMPREHENSIVE METABOLIC PANEL     Status: Abnormal   Collection Time    06/08/14  4:42 PM      Result Value Ref Range   Sodium 135 (*) 137 - 147 mEq/L   Potassium 3.4 (*) 3.7 - 5.3 mEq/L   Chloride 95 (*) 96 - 112 mEq/L   CO2 24  19 - 32 mEq/L   Glucose, Bld 127 (*) 70 - 99 mg/dL   BUN 15  6 - 23 mg/dL   Creatinine, Ser 0.68  0.50 - 1.10 mg/dL   Calcium 9.5  8.4 - 10.5 mg/dL   Total Protein 7.5  6.0 - 8.3 g/dL   Albumin 3.2 (*) 3.5 - 5.2 g/dL   AST 28  0 - 37 U/L   ALT 12  0 - 35 U/L   Alkaline Phosphatase 52  39 - 117 U/L   Total Bilirubin 1.1  0.3 - 1.2 mg/dL   GFR calc non Af Amer 78 (*) >90 mL/min   GFR calc Af Amer >90  >90 mL/min   Comment: (NOTE)     The  eGFR has been calculated using the CKD EPI equation.     This calculation has not been validated in all clinical situations.     eGFR's persistently <90 mL/min signify possible Chronic Kidney     Disease.   Anion gap 16 (*) 5 - 15  SEDIMENTATION RATE     Status: Abnormal   Collection Time    06/08/14  4:42 PM      Result Value Ref Range   Sed Rate 98 (*) 0 - 22 mm/hr  C-REACTIVE PROTEIN     Status: Abnormal   Collection Time    06/08/14  4:42 PM      Result Value Ref Range   CRP 15.6 (*) <0.60 mg/dL   Comment: Performed at [place]     Status: Abnormal   Collection Time    06/08/14  5:10 PM      Result Value Ref Range   Color, Urine AMBER (*) YELLOW   Comment: BIOCHEMICALS MAY BE AFFECTED BY COLOR   APPearance CLEAR  CLEAR   Specific Gravity, Urine 1.028  1.005 - 1.030   pH 5.5  5.0 - 8.0   Glucose, UA NEGATIVE  NEGATIVE mg/dL   Hgb urine dipstick NEGATIVE   NEGATIVE   Bilirubin Urine NEGATIVE  NEGATIVE   Ketones, ur 15 (*) NEGATIVE mg/dL   Protein, ur NEGATIVE  NEGATIVE mg/dL   Urobilinogen, UA 0.2  0.0 - 1.0 mg/dL   Nitrite NEGATIVE  NEGATIVE   Leukocytes, UA NEGATIVE  NEGATIVE   Comment: MICROSCOPIC NOT DONE ON URINES WITH NEGATIVE PROTEIN, BLOOD, LEUKOCYTES, NITRITE, OR GLUCOSE <1000 mg/dL.  I-STAT CG4 LACTIC ACID, ED     Status: None   Collection Time    06/08/14  5:14 PM      Result Value Ref Range   Lactic Acid, Venous 1.35  0.5 - 2.2 mmol/L  MRSA PCR SCREENING     Status: Abnormal   Collection Time    06/09/14  4:47 AM      Result Value Ref Range   MRSA by PCR POSITIVE (*) NEGATIVE   Comment:            The GeneXpert MRSA Assay (FDA     approved for NASAL specimens     only), is one component of a     comprehensive MRSA colonization     surveillance program. It is not     intended to diagnose MRSA     infection nor to guide or     monitor treatment for     MRSA infections.     RESULT CALLED TO, READ BACK BY AND VERIFIED WITH:     N. MAYFIELD RN AT [phone] ON 09.04.15 BY SHUEA  BASIC METABOLIC PANEL     Status: Abnormal   Collection Time    06/09/14  5:30 AM      Result Value Ref Range   Sodium 138  137 - 147 mEq/L   Potassium 3.1 (*) 3.7 - 5.3 mEq/L   Chloride 100  96 - 112 mEq/L   CO2 22  19 - 32 mEq/L   Glucose, Bld 106 (*) 70 - 99 mg/dL   BUN 8  6 - 23 mg/dL   Creatinine, Ser 0.57  0.50 - 1.10 mg/dL   Calcium 8.4  8.4 - 10.5 mg/dL   GFR calc non Af Amer 83 (*) >90 mL/min   GFR calc Af Amer >90  >90 mL/min  Comment: (NOTE)     The eGFR has been calculated using the CKD EPI equation.     This calculation has not been validated in all clinical situations.     eGFR's persistently <90 mL/min signify possible Chronic Kidney     Disease.   Anion gap 16 (*) 5 - 15  CBC     Status: None   Collection Time    06/09/14  5:30 AM      Result Value Ref Range   WBC 9.0  4.0 - 10.5 K/uL   RBC 3.88  3.87 - 5.11 MIL/uL    Hemoglobin 12.6  12.0 - 15.0 g/dL   HCT 38.3  36.0 - 46.0 %   MCV 98.7  78.0 - 100.0 fL   MCH 32.5  26.0 - 34.0 pg   MCHC 32.9  30.0 - 36.0 g/dL   RDW 12.1  11.5 - 15.5 %   Platelets 150  150 - 400 K/uL  URIC ACID     Status: None   Collection Time    06/09/14  5:30 AM      Result Value Ref Range   Uric Acid, Serum 4.2  2.4 - 7.0 mg/dL    Dg Chest 2 View  06/08/2014   CLINICAL DATA:  Hypertension.  EXAM: CHEST  2 VIEW  COMPARISON:  Chest radiograph 09/23/2013  FINDINGS: Stable cardiac contours. Marked tortuosity of the thoracic aorta. Calcified granuloma left lower lung. No large consolidative pulmonary opacity. No pleural effusion or pneumothorax. Mid thoracic spine degenerative change.  IMPRESSION: No acute cardiopulmonary process.   Electronically Signed   By: Lovey Newcomer M.D.   On: 06/08/2014 17:52   Dg Wrist Complete Left  06/08/2014   CLINICAL DATA:  LEFT wrist swelling and redness without injury  EXAM: LEFT WRIST - COMPLETE 3+ VIEW  COMPARISON:  None  FINDINGS: Osseous demineralization.  Joint space narrowing at first [facility] joint and STT joint.  No acute fracture, dislocation or bone destruction.  Minimal chondrocalcinosis at triangular fibrocartilage complex.  Mild soft tissue swelling particularly at the dorsal ulnar margin of the RIGHT wrist.  IMPRESSION: Degenerative changes at the radial border of the carpus with question CPPD.  Osseous demineralization and soft tissue swelling.  No acute abnormalities.   Electronically Signed   By: Lavonia Dana M.D.   On: 06/08/2014 17:52   Ct Head Wo Contrast  06/08/2014   CLINICAL DATA:  severe Headache  EXAM: CT HEAD WITHOUT CONTRAST  TECHNIQUE: Contiguous axial images were obtained from the base of the skull through the vertex without intravenous contrast.  COMPARISON:  10/18/2011  FINDINGS: Retention cyst or polyp in the left maxillary sinus. Atherosclerotic and physiologic intracranial calcifications. Diffuse parenchymal atrophy. Patchy areas of  hypoattenuation in deep and periventricular white matter bilaterally. Negative for acute intracranial hemorrhage, mass lesion, acute infarction, midline shift, or mass-effect. Acute infarct may be inapparent on noncontrast CT. Ventricles and sulci symmetric. Bone windows demonstrate no focal lesion.  IMPRESSION: 1. Negative for bleed or other acute intracranial process. 2. Stable atrophy and nonspecific white matter changes.   Electronically Signed   By: Arne Cleveland M.D.   On: 06/08/2014 19:26    ROS Blood pressure 155/79, pulse 80, temperature 97.3 F (36.3 C), temperature source Oral, resp. rate 16, height _0  (1.575 m), weight 81 kg (178 lb 9.2 oz), last menstrual period 08/22/2011, SpO2 97.00%. Physical Exam  Patient is an 80 year old female seen in hospital bed. Daughter and sitter  in room at bedside at this time.  (History obtained from daughter due to dementia of patient). Patient alert and appropriate but poor historian  Left upper extremity examined: Left Shoulder - Mild global tenderness to left shoulder, no erythema or ecchymosis noted around shoulder region.  Left Elbow - some pain noted on passive ROM with flexion more so than extension of elbow.  No issues with supination or pronation. Some small abrasions noted on volar aspect of the antecubital region of the elbow.  Only mild erythema noted in this area. (As per daughter, the elbow region which appeared cellulitic on Wed looks better since coming into the hospital.)  Left Wrist and Hand - erythema noted at the base of the left thumb on the radial aspect and the palmar aspect of the hand.  Pain noted on passive ROM with wrist volar flexion and dorsi flexion.  Tender to palpation at the [facility] base of the thumb and over the anatomical snuffbox of the wrist.  Pain also noted on ROM of the [facility] base joint of the thumb. Sensation intact to all digits and +2 radial and ulnar pulses noted on exam.  Radiographs: EXAM:  LEFT WRIST -  COMPLETE 3+ VIEW  COMPARISON: None  FINDINGS:  Osseous demineralization.  Joint space narrowing at first [facility] joint and STT joint.  No acute fracture, dislocation or bone destruction.  Minimal chondrocalcinosis at triangular fibrocartilage complex.  Mild soft tissue swelling particularly at the dorsal ulnar margin of  the RIGHT wrist.  IMPRESSION:  Degenerative changes at the radial border of the carpus with  question CPPD.  Osseous demineralization and soft tissue swelling.  No acute abnormalities.  Electronically Signed  By: Lavonia Dana M.D.  On: 06/08/2014 17:52  EXAM:  CT HEAD WITHOUT CONTRAST IMPRESSION:  1. Negative for bleed or other acute intracranial process.  2. Stable atrophy and nonspecific white matter changes.  Electronically Signed  By: Arne Cleveland M.D.  On: 06/08/2014 19:26   Xrays show significant degenerative changes in the [facility] joint of the left thumb with osteopenic changes in the hand and wrist. No fractures are readily identified.   Labs: WBC normal at 9.0 Uric Acid level normal at 4.2 Sed Rate High at 98 CRP High at 15.6  Assessment/Plan: Left Wrist and Thumb Pain  Possible gout / pseudogout in wrist/thumb.  Possible septic arthritis even though white count is normal but Sed Rate and CRP are both elevated.  Case will be reviewed with Dr. Wynelle Link and treatment plan and recommendations to follow.  Occult fracture not likely in lieu of no truama but remote possibility. May consider MRI if indicated.    PERKINS, ALEXZANDREW 06/09/2014, 10:55 AM     I have seen and examined the patient. She does not appear to have a septic wrist as I can move the wrist without discomfort and it is non-tender. She is tender and swollen at the base of the thumb and has significant arthritis in that joint on x-ray. She probably has an arthritic flare in that joint. I do not see any streaking erythema in her left arm. Given the elevated sed rate and CRP may want to put her  on IV antibiotics and she apparently had erythema in the arm leading up to this. No need for any operative intervention. Will check over weekend

## 2014-06-10 DIAGNOSIS — I5031 Acute diastolic (congestive) heart failure: Secondary | ICD-10-CM

## 2014-06-10 DIAGNOSIS — I5032 Chronic diastolic (congestive) heart failure: Secondary | ICD-10-CM | POA: Insufficient documentation

## 2014-06-10 LAB — URINE CULTURE
Colony Count: NO GROWTH
Culture: NO GROWTH

## 2014-06-10 MED ORDER — BOOST HIGH PROTEIN PO LIQD
1.0000 | Freq: Every day | ORAL | Status: DC
  Administered 2014-06-10: 1 via ORAL
  Filled 2014-06-10 (×2): qty 1

## 2014-06-10 MED ORDER — LISINOPRIL 2.5 MG PO TABS
2.5000 mg | ORAL_TABLET | Freq: Every day | ORAL | Status: DC
  Administered 2014-06-10: 2.5 mg via ORAL
  Filled 2014-06-10 (×2): qty 1

## 2014-06-10 MED ORDER — PREDNISONE (PAK) 10 MG PO TABS: 10.0000 mg | ORAL_TABLET | Freq: Three times a day (TID) | ORAL | Status: DC

## 2014-06-10 MED ORDER — PANTOPRAZOLE SODIUM 40 MG PO TBEC
40.0000 mg | DELAYED_RELEASE_TABLET | Freq: Every day | ORAL | Status: DC
  Administered 2014-06-10 – 2014-06-13 (×4): 40 mg via ORAL
  Filled 2014-06-10 (×4): qty 1

## 2014-06-10 MED ORDER — CLOPIDOGREL BISULFATE 75 MG PO TABS
75.0000 mg | ORAL_TABLET | Freq: Every day | ORAL | Status: DC
  Administered 2014-06-10 – 2014-06-13 (×4): 75 mg via ORAL
  Filled 2014-06-10 (×4): qty 1

## 2014-06-10 MED ORDER — MEMANTINE HCL ER 7 MG PO CP24
28.0000 mg | ORAL_CAPSULE | Freq: Every day | ORAL | Status: DC
  Administered 2014-06-10 – 2014-06-12 (×3): 28 mg via ORAL
  Filled 2014-06-10 (×4): qty 4

## 2014-06-10 MED ORDER — RIVASTIGMINE 4.6 MG/24HR TD PT24
4.6000 mg | MEDICATED_PATCH | Freq: Every day | TRANSDERMAL | Status: DC
Start: 2014-06-10 — End: 2014-06-13
  Administered 2014-06-10 – 2014-06-13 (×4): 4.6 mg via TRANSDERMAL
  Filled 2014-06-10 (×4): qty 1

## 2014-06-10 MED ORDER — ATORVASTATIN CALCIUM 20 MG PO TABS
20.0000 mg | ORAL_TABLET | Freq: Every day | ORAL | Status: DC
  Administered 2014-06-10 – 2014-06-12 (×3): 20 mg via ORAL
  Filled 2014-06-10 (×4): qty 1

## 2014-06-10 MED ORDER — FOLIC ACID 400 MCG PO TABS: 400.0000 ug | ORAL_TABLET | Freq: Every day | ORAL | Status: DC

## 2014-06-10 MED ORDER — MIRABEGRON ER 50 MG PO TB24
50.0000 mg | ORAL_TABLET | Freq: Every day | ORAL | Status: DC
  Administered 2014-06-10 – 2014-06-13 (×4): 50 mg via ORAL
  Filled 2014-06-10 (×4): qty 1

## 2014-06-10 MED ORDER — CITALOPRAM HYDROBROMIDE 20 MG PO TABS
20.0000 mg | ORAL_TABLET | Freq: Every day | ORAL | Status: DC
  Administered 2014-06-10 – 2014-06-13 (×4): 20 mg via ORAL
  Filled 2014-06-10 (×4): qty 1

## 2014-06-10 MED ORDER — PREDNISONE (PAK) 10 MG PO TABS: 10.0000 mg | ORAL_TABLET | ORAL | Status: DC

## 2014-06-10 MED ORDER — BISACODYL 10 MG RE SUPP
10.0000 mg | Freq: Every day | RECTAL | Status: DC | PRN
  Administered 2014-06-10: 10 mg via RECTAL
  Filled 2014-06-10: qty 1

## 2014-06-10 MED ORDER — PREDNISONE (PAK) 10 MG PO TABS: 10.0000 mg | ORAL_TABLET | Freq: Four times a day (QID) | ORAL | Status: DC

## 2014-06-10 MED ORDER — FOLIC ACID 0.5 MG HALF TAB
0.5000 mg | ORAL_TABLET | Freq: Every day | ORAL | Status: DC
  Administered 2014-06-10 – 2014-06-13 (×4): 0.5 mg via ORAL
  Filled 2014-06-10 (×4): qty 1

## 2014-06-10 MED ORDER — FUROSEMIDE 10 MG/ML IJ SOLN
20.0000 mg | Freq: Two times a day (BID) | INTRAMUSCULAR | Status: DC
  Administered 2014-06-10 – 2014-06-11 (×4): 20 mg via INTRAVENOUS
  Filled 2014-06-10 (×7): qty 2

## 2014-06-10 MED ORDER — PREDNISONE (PAK) 10 MG PO TABS: 20.0000 mg | ORAL_TABLET | Freq: Every evening | ORAL | Status: DC

## 2014-06-10 MED ORDER — PREDNISONE (PAK) 10 MG PO TABS
20.0000 mg | ORAL_TABLET | Freq: Every morning | ORAL | Status: DC
  Filled 2014-06-10: qty 21

## 2014-06-10 MED ORDER — CITALOPRAM HYDROBROMIDE 40 MG PO TABS: 40.0000 mg | ORAL_TABLET | Freq: Every day | ORAL | Status: DC

## 2014-06-10 MED ORDER — POLYETHYLENE GLYCOL 3350 17 G PO PACK
17.0000 g | PACK | Freq: Every day | ORAL | Status: DC
  Administered 2014-06-10 – 2014-06-13 (×4): 17 g via ORAL
  Filled 2014-06-10 (×4): qty 1

## 2014-06-10 MED ORDER — MEMANTINE HCL ER 28 MG PO CP24: 28.0000 mg | ORAL_CAPSULE | Freq: Every day | ORAL | Status: DC

## 2014-06-10 NOTE — Progress Notes (Signed)
Foley placed per md order for diuresis, pt tolerated procedure well. Will continue to monitor

## 2014-06-10 NOTE — Progress Notes (Signed)
PROGRESS NOTE  Holly Grimes [facility]:[id number] DOB: 1927-05-09 DOA: 06/08/2014 PCP: Hollace Kinnier, DO  HPI/Recap of past 24 hours: Patient is an 80 year old white female past medical history of Parkinson's dementia and hypertension is from home with 24-hour care and was noted to have some pain and erythema of her left wrist. According to her family, she also looked to be more confused than her baseline. Patient brought in a evaluated by hospitalist service. See the orthopedic surgery who felt that this possibly could be inflammatory arthropathy, however patient ESR and sedimentation rate were quite elevated and they recommended antibiotic treatment as well.  Since admission, patient has been favoring keeping her head turned to the right with pain trying to turn her head the other direction.  No history of heart failure but given persistently elevated blood pressures, BNP checked and found to be elevated at 4800. Started on IV Lasix, Foley placed and echocardiogram ordered.  Assessment/Plan: Principal Problem:   Swollen joint: We'll treat as cellulitis plus/minus inflammatory arthropathy. Active Problems:   Hypertension: Patient noted to have some elevated blood pressures. On home medications. With diuresis this should help.  Acute diastolic heart failure: New diagnosis. Checking echo. Place Foley and started IV Lasix. Checking daily weights.    Parkinson's Dementia with acute confusion: Suspect secondary to arthropathy/cellulitis. As above, started on IV clindamycin    Obesity (BMI 30-39.9): Patient needs criteria with BMI greater than 30    Bipolar I disorder, most recent episode (or current) manic   Generalized weakness    Acute torticollis: Looks to be an acute new finding. Am not suspecting anything more malignant such as a fracture. She is able to turn her head somewhat with resistance in pain, but is able to do so. Will use muscle relaxers   Code Status:  DO NOT RESUSCITATE  Family  Communication:  Spoke with patient's caregiver nurse at the bedside  Disposition Plan:  Would like to see torticollis improved and decreased pain and erythema and was before discharge. Hoping to discharge tomorrow   Consultants:   Hand surgery  Procedures:   None  Antibiotics:   IV clindamycin 9/4-present  HPI/Subjective: When asked, patient has no complaints. With prompting examining her neck or wrist, she does relate pain  Objective: BP 152/74  Pulse 60  Temp(Src) 98.3 F (36.8 C) (Oral)  Resp 16  Ht $R'5\' 2"'hX$  (1.575 m)  Wt 84.7 kg (186 lb 11.7 oz)  BMI 34.14 kg/m2  SpO2 98%  LMP 08/22/2011  Intake/Output Summary (Last 24 hours) at 06/10/14 1329 Last data filed at 06/09/14 2200  Gross per 24 hour  Intake    150 ml  Output      0 ml  Net    150 ml   Filed Weights   06/08/14 1612 06/09/14 0252 06/10/14 0427  Weight: 82.6 kg (182 lb 1.6 oz) 81 kg (178 lb 9.2 oz) 84.7 kg (186 lb 11.7 oz)    Exam:   General:  Alert and oriented x1, no acute distress, patient head is angled and turned toward the right, not much change  Cardiovascular:  Sinus arrhythmia with occasional ectopic beats  Respiratory:  Clear to auscultation bilaterally  Abdomen:  Soft, nontender, nondistended, positive bowel sounds  Musculoskeletal:  Trace edema. Left wrist is tender as is area at base of thumb. Decreased erythema  Data Reviewed: Basic Metabolic Panel:  Recent Labs Lab 06/08/14 1642 06/09/14 0530  NA 135* 138  K 3.4* 3.1*  CL 95* 100  CO2 24 22  GLUCOSE 127* 106*  BUN 15 8  CREATININE 0.68 0.57  CALCIUM 9.5 8.4   Liver Function Tests:  Recent Labs Lab 06/08/14 1642  AST 28  ALT 12  ALKPHOS 52  BILITOT 1.1  PROT 7.5  ALBUMIN 3.2*   No results found for this basename: LIPASE, AMYLASE,  in the last 168 hours No results found for this basename: AMMONIA,  in the last 168 hours CBC:  Recent Labs Lab 06/08/14 1642 06/09/14 0530  WBC 9.5 9.0  NEUTROABS 7.5  --     HGB 13.8 12.6  HCT 40.9 38.3  MCV 98.3 98.7  PLT 177 150   Cardiac Enzymes:   No results found for this basename: CKTOTAL, CKMB, CKMBINDEX, TROPONINI,  in the last 168 hours BNP (last 3 results)  Recent Labs  06/09/14 1620  PROBNP 4261.0*   CBG: No results found for this basename: GLUCAP,  in the last 168 hours  Recent Results (from the past 240 hour(s))  URINE CULTURE     Status: None   Collection Time    06/06/14  5:22 PM      Result Value Ref Range Status   Urine Culture, Routine Final report   Final   Result 1 Comment   Final   Comment: Mixed urogenital flora     Greater than 100,000 colony forming units per mL  CULTURE, BLOOD (ROUTINE X 2)     Status: None   Collection Time    06/08/14  4:42 PM      Result Value Ref Range Status   Specimen Description BLOOD RIGHT HAND   Final   Special Requests BOTTLES DRAWN AEROBIC AND ANAEROBIC 6 CC   Final   Culture  Setup Time     Final   Value: 06/08/2014 22:00     Performed at [facility]   Culture     Final   Value:        BLOOD CULTURE RECEIVED NO GROWTH TO DATE CULTURE WILL BE HELD FOR 5 DAYS BEFORE ISSUING A FINAL NEGATIVE REPORT     Performed at [facility]   Report Status PENDING   Incomplete  CULTURE, BLOOD (ROUTINE X 2)     Status: None   Collection Time    06/08/14  4:42 PM      Result Value Ref Range Status   Specimen Description BLOOD RIGHT WRIST   Final   Special Requests BOTTLES DRAWN AEROBIC AND ANAEROBIC 6 CC   Final   Culture  Setup Time     Final   Value: 06/08/2014 21:59     Performed at [facility]   Culture     Final   Value:        BLOOD CULTURE RECEIVED NO GROWTH TO DATE CULTURE WILL BE HELD FOR 5 DAYS BEFORE ISSUING A FINAL NEGATIVE REPORT     Performed at [facility]   Report Status PENDING   Incomplete  URINE CULTURE     Status: None   Collection Time    06/08/14  5:10 PM      Result Value Ref Range Status   Specimen Description URINE, CLEAN CATCH   Final    Special Requests NONE   Final   Culture  Setup Time     Final   Value: 06/08/2014 21:23     Performed at [facility]     Final   Value: NO  GROWTH     Performed at [facility]   Culture     Final   Value: NO GROWTH     Performed at [facility]   Report Status 06/10/2014 FINAL   Final  MRSA PCR SCREENING     Status: Abnormal   Collection Time    06/09/14  4:47 AM      Result Value Ref Range Status   MRSA by PCR POSITIVE (*) NEGATIVE Final   Comment:            The GeneXpert MRSA Assay (FDA     approved for NASAL specimens     only), is one component of a     comprehensive MRSA colonization     surveillance program. It is not     intended to diagnose MRSA     infection nor to guide or     monitor treatment for     MRSA infections.     RESULT CALLED TO, READ BACK BY AND VERIFIED WITH:     N. MAYFIELD RN AT [phone] ON 09.04.15 BY SHUEA     Studies: No results found.  Scheduled Meds: . clindamycin (CLEOCIN) IV  300 mg Intravenous 3 times per day  . enoxaparin (LOVENOX) injection  40 mg Subcutaneous Q24H  . furosemide  20 mg Intravenous Q12H  . lisinopril  2.5 mg Oral Daily  . predniSONE  40 mg Oral Q breakfast    Continuous Infusions:    Time spent: 25 minutes  [facility] Pager [phone]. If 7PM-7AM, please contact night-coverage at [email or web address], password [facility] 06/10/2014, 1:29 PM  LOS: 2 days

## 2014-06-10 NOTE — Progress Notes (Signed)
     Subjective: Left Wrist Pain  Patient resting comfortably in bed.  Difficult to arouse from sleep, unable to fully answer if her wrist was hurting. No events throughout the night.    Objective:   VITALS:   Filed Vitals:   06/10/14  BP: 152/74  Pulse: 60  Temp: 98.3 F (36.8 C)   Resp: 16    No cellulitis present Compartment soft No expressed pain with palpation of the wrist.  LABS  Recent Labs  06/08/14 1642 06/09/14 0530  HGB 13.8 12.6  HCT 40.9 38.3  WBC 9.5 9.0  PLT 177 150     Recent Labs  06/08/14 1642 06/09/14 0530  NA 135* 138  K 3.4* 3.1*  BUN 15 8  CREATININE 0.68 0.57  GLUCOSE 127* 106*     Assessment/Plan: Left Wrist Pain  Doesn't appear to have a septic wrist as I can move the wrist without discomfort and it is non-tender.  She is tender and swollen at the base of the thumb and has significant arthritis in that joint on x-ray.  She probably has an arthritic flare in that joint.  Given the elevated sed rate and CRP may want to put her on IV antibiotics and she apparently had erythema in the arm leading up to this.  No need for any operative intervention.  Will check on patient and labs over weekend.    Anastasio Auerbach Ireene Ballowe   PAC  06/10/2014, 11:18 AM

## 2014-06-10 NOTE — Progress Notes (Signed)
Echocardiogram 2D Echocardiogram has been performed.  Holly Grimes 06/10/2014, 1:11 PM

## 2014-06-10 NOTE — Progress Notes (Signed)
Patient is not oriented and does not have family at bedside to complete heart failure education. Will continue to monitor and re-adress when family present.

## 2014-06-11 DIAGNOSIS — I10 Essential (primary) hypertension: Secondary | ICD-10-CM

## 2014-06-11 LAB — BASIC METABOLIC PANEL
Anion gap: 12 (ref 5–15)
BUN: 15 mg/dL (ref 6–23)
CALCIUM: 9 mg/dL (ref 8.4–10.5)
CO2: 27 mEq/L (ref 19–32)
Chloride: 102 mEq/L (ref 96–112)
Creatinine, Ser: 0.66 mg/dL (ref 0.50–1.10)
GFR calc Af Amer: >90 mL/min (ref >90)
GFR, EST NON AFRICAN AMERICAN: 79 mL/min — AB (ref >90)
Glucose, Bld: 111 mg/dL — ABNORMAL HIGH (ref 70–99)
POTASSIUM: 3 meq/L — AB (ref 3.7–5.3)
SODIUM: 141 meq/L (ref 137–147)

## 2014-06-11 MED ORDER — PREDNISONE 20 MG PO TABS
20.0000 mg | ORAL_TABLET | Freq: Every day | ORAL | Status: AC
  Administered 2014-06-13: 20 mg via ORAL
  Filled 2014-06-11: qty 1

## 2014-06-11 MED ORDER — LISINOPRIL 5 MG PO TABS
5.0000 mg | ORAL_TABLET | Freq: Every day | ORAL | Status: DC
  Administered 2014-06-11 – 2014-06-13 (×3): 5 mg via ORAL
  Filled 2014-06-11 (×3): qty 1

## 2014-06-11 MED ORDER — CLINDAMYCIN HCL 300 MG PO CAPS
300.0000 mg | ORAL_CAPSULE | Freq: Three times a day (TID) | ORAL | Status: DC
  Administered 2014-06-11 – 2014-06-13 (×7): 300 mg via ORAL
  Filled 2014-06-11 (×9): qty 1

## 2014-06-11 MED ORDER — ENSURE COMPLETE PO LIQD
237.0000 mL | Freq: Every day | ORAL | Status: DC
  Administered 2014-06-11 – 2014-06-13 (×3): 237 mL via ORAL

## 2014-06-11 MED ORDER — PREDNISONE 20 MG PO TABS
30.0000 mg | ORAL_TABLET | Freq: Every day | ORAL | Status: AC
  Administered 2014-06-12: 30 mg via ORAL
  Filled 2014-06-11: qty 1

## 2014-06-11 NOTE — Progress Notes (Signed)
Subjective: Wrist pain better.     Objective: Vital signs in last 24 hours: Temp:  [97.3 F (36.3 C)-98.3 F (36.8 C)] 97.8 F (36.6 C) (09/06 0502) Pulse Rate:  [60-72] 60 (09/06 0502) Resp:  [16] 16 (09/06 0502) BP: (129-156)/(51-91) 154/74 mmHg (09/06 0502) SpO2:  [95 %-100 %] 95 % (09/06 0502) Weight:  [79.9 kg (176 lb 2.4 oz)] 79.9 kg (176 lb 2.4 oz) (09/06 0502)  Intake/Output from previous day: 09/05 0701 - 09/06 0700 In: 410 [P.O.:360; IV Piggyback:50] Out: 2275 [Urine:2275] Intake/Output this shift:     Recent Labs  06/08/14 1642 06/09/14 0530  HGB 13.8 12.6    Recent Labs  06/08/14 1642 06/09/14 0530  WBC 9.5 9.0  RBC 4.16 3.88  HCT 40.9 38.3  PLT 177 150    Recent Labs  06/09/14 0530 06/11/14 0440  NA 138 141  K 3.1* 3.0*  CL 100 102  CO2 22 27  BUN 8 15  CREATININE 0.57 0.66  GLUCOSE 106* 111*  CALCIUM 8.4 9.0   No results found for this basename: LABPT, INR,  in the last 72 hours  Exam:  Left wrist good ROM.  Some TTP but states that this is better.  Moves fingers well.  NVI.    Assessment/Plan: Continue present care.     Holly Grimes M 06/11/2014, 10:36 AM

## 2014-06-11 NOTE — Progress Notes (Addendum)
Clinical Social Work Department CLINICAL SOCIAL WORK PLACEMENT NOTE 06/11/2014  Patient:  Holly Grimes, Holly Grimes  Account Number:  [id number] Admit date:  06/08/2014  Clinical Social Worker:  Jacelyn Grip  Date/time:  06/11/2014 11:19 AM  Clinical Social Work is seeking post-discharge placement for this patient at the following level of care:   SKILLED NURSING   (*CSW will update this form in Epic as items are completed)   06/11/2014  Patient/family provided with [facility] Department of Clinical Social Work's list of facilities offering this level of care within the geographic area requested by the patient (or if unable, by the patient's family).  06/11/2014  Patient/family informed of their freedom to choose among providers that offer the needed level of care, that participate in Medicare, Medicaid or managed care program needed by the patient, have an available bed and are willing to accept the patient.  06/11/2014  Patient/family informed of [facility]' ownership interest in [facility], as well as of the fact that they are under no obligation to receive care at this facility.  PASARR submitted to EDS on 06/11/2014 PASARR number received on 06/11/2014  FL2 transmitted to all facilities in geographic area requested by pt/family on  06/11/2014 FL2 transmitted to all facilities within larger geographic area on   Patient informed that his/her managed care company has contracts with or will negotiate with  certain facilities, including the following:     Patient/family informed of bed offers received:  06/12/2014 Patient chooses bed at [facility] Re[facility] Physician recommends and patient chooses bed at    Patient to be transferred to  on  [facility] Patient to be transferred to facility by [facility] privately funded by pt daughter Patient and family notified of transfer on 06/13/2014 Name of family member notified:  Pt daughter,  [name]  The following physician request were entered in Epic:   Additional Comments:   Loletta Specter, MSW, LCSW Clinical Social Work [phone]

## 2014-06-11 NOTE — Progress Notes (Signed)
CARE MANAGEMENT NOTE 06/11/2014  Patient:  Holly Grimes, Holly Grimes   Account Number:  [id number]  Date Initiated:  06/11/2014  Documentation initiated by:  [facility]  Subjective/Objective Assessment:   cellulitis     Action/Plan:   Anticipated DC Date:  06/13/2014   Anticipated DC Plan:  SKILLED NURSING FACILITY  In-house referral  Clinical Social Worker      DC Planning Services  CM consult      Choice offered to / List presented to:             Status of service:   Medicare Important Message given?  YES (If response is "NO", the following Medicare IM given date fields will be blank) Date Medicare IM given:  06/11/2014 Medicare IM given by:  [facility] Date Additional Medicare IM given:   Additional Medicare IM given by:    Discharge Disposition:  SKILLED NURSING FACILITY  Per UR Regulation:    If discussed at Long Length of Stay Meetings, dates discussed:    Comments:

## 2014-06-11 NOTE — Evaluation (Signed)
Physical Therapy Evaluation Patient Details Name: Holly Grimes MRN: [id number] DOB: [birth year] Today's Date: 06/11/2014   History of Present Illness  80 year old female with past medical history of Parkinson's dementia, hypertension, R hip hemiarthroplasty from home with 24-hour care admitted 06/08/14 with pain and erythema of her left wrist and progressive weakness.  Clinical Impression  Pt currently with functional limitations due to the deficits listed below (see PT Problem List).  Pt will benefit from skilled PT to increase their independence and safety with mobility to allow discharge to the venue listed below.  Pt only able to tolerate short distance ambulation, recliner following, with increased assist.  Recommend SNF upon d/c.     Follow Up Recommendations SNF    Equipment Recommendations  None recommended by PT    Recommendations for Other Services       Precautions / Restrictions Precautions Precautions: Fall Precaution Comments: tender L wrist      Mobility  Bed Mobility Overal bed mobility: Needs Assistance Bed Mobility: Supine to Sit     Supine to sit: +2 for physical assistance;Max assist     General bed mobility comments: pt assisted with LEs over edge of bed and scooting hips to EOB however required increased assist for trunk upright  Transfers Overall transfer level: Needs assistance Equipment used: Rolling walker (2 wheeled) Transfers: Sit to/from Stand Sit to Stand: +2 physical assistance;Mod assist         General transfer comment: verbal cues for technique, assist to rise and steady and control descent  Ambulation/Gait Ambulation/Gait assistance: Mod assist;+2 physical assistance Ambulation Distance (Feet): 18 Feet Assistive device: Rolling walker (2 wheeled) Gait Pattern/deviations: Step-through pattern;Decreased stride length;Shuffle;Trunk flexed Gait velocity: decr   General Gait Details: verbal cues for RW positioning, visual and verbal  cues for step length, recliner following as son in law reports pt "gives out" quickly  Stairs            Wheelchair Mobility    Modified Rankin (Stroke Patients Only)       Balance                                             Pertinent Vitals/Pain Pain Assessment: Faces Faces Pain Scale: Hurts even more Pain Location: L wrist tender to touch Pain Descriptors / Indicators: Discomfort;Sore Pain Intervention(s): Monitored during session;Repositioned;Limited activity within patient's tolerance    Home Living Family/patient expects to be discharged to:: Skilled nursing facility Living Arrangements: Children                    Prior Function Level of Independence: Needs assistance   Gait / Transfers Assistance Needed: amb  with 4 wheeled walker and assist with gait belt, progressive decline in function per son in law           Hand Dominance        Extremity/Trunk Assessment   Upper Extremity Assessment:  (reports "bad painful" shoulders bilaterally)           Lower Extremity Assessment: Generalized weakness      Cervical / Trunk Assessment: Other exceptions  Communication   Communication: HOH  Cognition Arousal/Alertness: Awake/alert Behavior During Therapy: WFL for tasks assessed/performed Overall Cognitive Status: Within Functional Limits for tasks assessed (hx Alzheimers)  General Comments      Exercises        Assessment/Plan    PT Assessment Patient needs continued PT services  PT Diagnosis Difficulty walking;Generalized weakness   PT Problem List Decreased strength;Decreased activity tolerance;Decreased balance;Decreased mobility;Decreased knowledge of use of DME;Pain  PT Treatment Interventions DME instruction;Gait training;Functional mobility training;Patient/family education;Therapeutic activities;Therapeutic exercise;Balance training;Wheelchair mobility training   PT Goals  (Current goals can be found in the Care Plan section) Acute Rehab PT Goals PT Goal Formulation: With patient/family Time For Goal Achievement: 06/18/14 Potential to Achieve Goals: Good    Frequency Min 3X/week   Barriers to discharge        Co-evaluation               End of Session Equipment Utilized During Treatment: Gait belt Activity Tolerance: Patient limited by fatigue Patient left: in chair;with call bell/phone within reach;with family/visitor present Nurse Communication: Mobility status (+2, chair alarm pad under pt however no chair alarm  in room)         Time: 1250-1315 PT Time Calculation (min): 25 min   Charges:   PT Evaluation $Initial PT Evaluation Tier I: 1 Procedure PT Treatments $Gait Training: 8-22 mins   PT G Codes:          Holly Grimes,Holly Grimes 06/11/2014, 1:33 PM Zenovia Jarred, PT, DPT 06/11/2014 Pager: [phone]

## 2014-06-11 NOTE — Progress Notes (Signed)
PROGRESS NOTE  Holly Grimes XBJ:[id number] DOB: September 19, 1927 DOA: 06/08/2014 PCP: Hollace Kinnier, DO  HPI/Recap of past 24 hours: Patient is an 80 year old white female past medical history of Parkinson's dementia and hypertension is from home with 24-hour care and was noted to have some pain and erythema of her left wrist. According to her family, she also looked to be more confused than her baseline. Patient brought in a evaluated by hospitalist service. See the orthopedic surgery who felt that this possibly could be inflammatory arthropathy, however patient ESR and sedimentation rate were quite elevated and they recommended antibiotic treatment as well.  Since admission, patient had been favoring keeping her head turned to the right with pain trying to turn her head the other direction.No history of heart failure but given persistently elevated blood pressures, BNP checked and found to be elevated at 4800. Started on IV Lasix.  Patient has diuresed almost 2 L since. This morning, she is significantly improved. Very interactive, appropriate with quite a since of humor. She denies any complaints. She does not have much in terms of short-term memory and does not recall the events of the last few days.  Assessment/Plan: Principal Problem:   Swollen joint: We'll treat as cellulitis plus/minus inflammatory arthropathy. Responding well with significantly decreased pain in her arm. Taper steroids. Active Problems:   Hypertension: Patient noted to have some elevated blood pressures. On home medications. With diuresis, this should help. Titrate up lisinopril.  Acute diastolic heart failure: New diagnosis. Echo notes a grade 2 diastolic dysfunction. Have added lisinopril and then titrating up. Hesitant to start beta blocker as her resting heart rate is around 60 already. She is down about 6 pounds since admission    Parkinson's Dementia with acute confusion: Suspect secondary to arthropathy/cellulitis. As  above, started on IV clindamycin. Will change to by mouth.    Obesity (BMI 30-39.9): Patient needs criteria with BMI greater than 30    Bipolar I disorder, most recent episode (or current) manic   Generalized weakness    Acute torticollis: Resolved. Unclear if this was actual next range versus patient's dementia, but she is comfortably moving her neck around today   Code Status:  DO NOT RESUSCITATE  Family Communication:  Spoke with patient's daughter by phone yesterday.  Disposition: Continue diuresis. Family would like placement. Likely Tuesday.  Consultants:   Hand surgery  Procedures:   None  Antibiotics:   Clindamycin IV, now by mouth: 9/4-present  HPI/Subjective: Patient quite alert and interactive. No complaints. Denies any ear pain.  Objective: BP 154/74  Pulse 60  Temp(Src) 97.8 F (36.6 C) (Axillary)  Resp 16  Ht _0  (1.575 m)  Wt 79.9 kg (176 lb 2.4 oz)  BMI 32.21 kg/m2  SpO2 95%  LMP 08/22/2011  Intake/Output Summary (Last 24 hours) at 06/11/14 0918 Last data filed at 06/11/14 3086  Gross per 24 hour  Intake    290 ml  Output   2275 ml  Net  -1985 ml   Filed Weights   06/09/14 0252 06/10/14 0427 06/11/14 0502  Weight: 81 kg (178 lb 9.2 oz) 84.7 kg (186 lb 11.7 oz) 79.9 kg (176 lb 2.4 oz)    Exam:   General:  Alert and oriented x2, no acute distress  HEENT: As per daughters request, in her ear examined. No signs of otitis. Noted cerumen  Cardiovascular:  Sinus arrhythmia with occasional ectopic beats  Respiratory:  Clear to auscultation bilaterally  Abdomen:  Soft, nontender, nondistended,  positive bowel sounds  Musculoskeletal:  Trace edema. Left wrist and area at base of thumb with decreased tenderness  Data Reviewed: Basic Metabolic Panel:  Recent Labs Lab 06/08/14 1642 06/09/14 0530 06/11/14 0440  NA 135* 138 141  K 3.4* 3.1* 3.0*  CL 95* 100 102  CO2 _0 GLUCOSE 127* 106* 111*  BUN _1 CREATININE 0.68  0.57 0.66  CALCIUM 9.5 8.4 9.0   Liver Function Tests:  Recent Labs Lab 06/08/14 1642  AST 28  ALT 12  ALKPHOS 52  BILITOT 1.1  PROT 7.5  ALBUMIN 3.2*   No results found for this basename: LIPASE, AMYLASE,  in the last 168 hours No results found for this basename: AMMONIA,  in the last 168 hours CBC:  Recent Labs Lab 06/08/14 1642 06/09/14 0530  WBC 9.5 9.0  NEUTROABS 7.5  --   HGB 13.8 12.6  HCT 40.9 38.3  MCV 98.3 98.7  PLT 177 150   Cardiac Enzymes:   No results found for this basename: CKTOTAL, CKMB, CKMBINDEX, TROPONINI,  in the last 168 hours BNP (last 3 results)  Recent Labs  06/09/14 1620  PROBNP 4261.0*   CBG: No results found for this basename: GLUCAP,  in the last 168 hours  Recent Results (from the past 240 hour(s))  URINE CULTURE     Status: None   Collection Time    06/06/14  5:22 PM      Result Value Ref Range Status   Urine Culture, Routine Final report   Final   Result 1 Comment   Final   Comment: Mixed urogenital flora     Greater than 100,000 colony forming units per mL  CULTURE, BLOOD (ROUTINE X 2)     Status: None   Collection Time    06/08/14  4:42 PM      Result Value Ref Range Status   Specimen Description BLOOD RIGHT HAND   Final   Special Requests BOTTLES DRAWN AEROBIC AND ANAEROBIC 6 CC   Final   Culture  Setup Time     Final   Value: 06/08/2014 22:00     Performed at [facility]   Culture     Final   Value:        BLOOD CULTURE RECEIVED NO GROWTH TO DATE CULTURE WILL BE HELD FOR 5 DAYS BEFORE ISSUING A FINAL NEGATIVE REPORT     Performed at [facility]   Report Status PENDING   Incomplete  CULTURE, BLOOD (ROUTINE X 2)     Status: None   Collection Time    06/08/14  4:42 PM      Result Value Ref Range Status   Specimen Description BLOOD RIGHT WRIST   Final   Special Requests BOTTLES DRAWN AEROBIC AND ANAEROBIC 6 CC   Final   Culture  Setup Time     Final   Value: 06/08/2014 21:59     Performed at  [facility]   Culture     Final   Value:        BLOOD CULTURE RECEIVED NO GROWTH TO DATE CULTURE WILL BE HELD FOR 5 DAYS BEFORE ISSUING A FINAL NEGATIVE REPORT     Performed at [facility]   Report Status PENDING   Incomplete  URINE CULTURE     Status: None   Collection Time    06/08/14  5:10 PM      Result Value Ref Range Status  Specimen Description URINE, CLEAN CATCH   Final   Special Requests NONE   Final   Culture  Setup Time     Final   Value: 06/08/2014 21:23     Performed at [place]     Final   Value: NO GROWTH     Performed at [facility]   Culture     Final   Value: NO GROWTH     Performed at [facility]   Report Status 06/10/2014 FINAL   Final  MRSA PCR SCREENING     Status: Abnormal   Collection Time    06/09/14  4:47 AM      Result Value Ref Range Status   MRSA by PCR POSITIVE (*) NEGATIVE Final   Comment:            The GeneXpert MRSA Assay (FDA     approved for NASAL specimens     only), is one component of a     comprehensive MRSA colonization     surveillance program. It is not     intended to diagnose MRSA     infection nor to guide or     monitor treatment for     MRSA infections.     RESULT CALLED TO, READ BACK BY AND VERIFIED WITH:     N. MAYFIELD RN AT [phone] ON 09.04.15 BY SHUEA     Studies: No results found.  Scheduled Meds: . atorvastatin  20 mg Oral q1800  . citalopram  20 mg Oral Daily  . clindamycin (CLEOCIN) IV  300 mg Intravenous 3 times per day  . clopidogrel  75 mg Oral Daily  . enoxaparin (LOVENOX) injection  40 mg Subcutaneous Q24H  . feeding supplement (ENSURE COMPLETE)  237 mL Oral Daily  . folic acid  0.5 mg Oral Daily  . furosemide  20 mg Intravenous Q12H  . lisinopril  2.5 mg Oral Daily  . Memantine HCl ER  28 mg Oral QHS  . mirabegron ER  50 mg Oral Daily  . pantoprazole  40 mg Oral Daily  . polyethylene glycol  17 g Oral Daily  . predniSONE  40 mg Oral Q  breakfast  . rivastigmine  4.6 mg Transdermal Daily    Continuous Infusions:    Time spent: 15 minutes  [facility] Pager [phone]. If 7PM-7AM, please contact night-coverage at [email or web address], password [facility] 06/11/2014, 9:18 AM  LOS: 3 days

## 2014-06-11 NOTE — Progress Notes (Signed)
Clinical Social Work Department BRIEF PSYCHOSOCIAL ASSESSMENT 06/11/2014  Patient:  Holly Grimes, Holly Grimes     Account Number:  [id number]     Admit date:  06/08/2014  Clinical Social Worker:  Holly Grimes  Date/Time:  06/11/2014 10:00 AM  Referred by:  Physician  Date Referred:  06/11/2014 Referred for  SNF Placement   Other Referral:   Interview type:  Family Other interview type:    PSYCHOSOCIAL DATA Living Status:  FAMILY Admitted from facility:   Level of care:   Primary support name:  Holly Grimes/daughter/[phone] Primary support relationship to patient:  CHILD, ADULT Degree of support available:   strong    CURRENT CONCERNS Current Concerns  Post-Acute Placement   Other Concerns:    SOCIAL WORK ASSESSMENT / PLAN CSW received referral for New SNF.    CSW reviewed chart and noted that pt has a history of dementia and oriented to person only. CSW contacted pt daughter, Holly Bradford via telephone. CSW introduced self and explained role. Pt daughter stated that pt lived with her, but has become progressively weaker over time and in the past week pt daughter reports having to provide total care for pt at home. CSW provided support as pt daughter discussed that she has been looking into placement for pt from home and understands that pt needs placement at SNF. Pt daughter discussed that pt family is not yet ready for pt to be placed long term unless it was a facility that the family was very satisfied with, but pt daughter plans to just take it day by day regarding decision for longer term plan for pt. CSW discussed with pt daughter regarding SNF placement and process of SNF search. Pt daughter familiar with SNF facilities as pt has been at SNF in the past and pt daughter was beginning to explore options for pt from home. Pt daughter agreeable to [facility] search. CSW provided supportive listening as pt daughter expressed concerns about facility that pt has been to in the past  and states that she would like to tour facilities before deciding on placement this admission as she regrets making decision based on recommendations the last time pt needed SNF. Pt expressed interest in [facility] [place] and CSW discussed with pt daughter that CSW will contact facility tomorrow re: pt daughter interest.    CSW completed FL2 and initiated SNF search to [facility].    CSW to follow up with pt daughter re: SNF bed offers.    CSW to continue to follow and provide support and assist with pt disposition needs.   Assessment/plan status:  Psychosocial Support/Ongoing Assessment of Needs Other assessment/ plan:   discharge planning   Information/referral to community resources:   Discussed [facility] facilities. Will provide list when pt daughter present at bedside.    PATIENT'S/FAMILY'S RESPONSE TO PLAN OF CARE: Pt alert and oriented to person only. Pt daughter supportive and actively involved in pt care. Pt daughter strong advocate for pt and wants to ensure that SNF facility chosen can meet pt needs. Pt daughter appreciative of CSW phone call and support.    Holly Grimes, MSW, LCSW Clinical Social Work Weekend coverage [phone]

## 2014-06-12 LAB — BASIC METABOLIC PANEL
Anion gap: 17 — ABNORMAL HIGH (ref 5–15)
BUN: 21 mg/dL (ref 6–23)
CO2: 28 mEq/L (ref 19–32)
Calcium: 9.3 mg/dL (ref 8.4–10.5)
Chloride: 96 mEq/L (ref 96–112)
Creatinine, Ser: 0.73 mg/dL (ref 0.50–1.10)
GFR calc non Af Amer: 76 mL/min — ABNORMAL LOW (ref >90)
GFR, EST AFRICAN AMERICAN: 88 mL/min — AB (ref >90)
Glucose, Bld: 128 mg/dL — ABNORMAL HIGH (ref 70–99)
POTASSIUM: 3.5 meq/L — AB (ref 3.7–5.3)
Sodium: 141 mEq/L (ref 137–147)

## 2014-06-12 LAB — PRO B NATRIURETIC PEPTIDE: Pro B Natriuretic peptide (BNP): 2050 pg/mL — ABNORMAL HIGH (ref 0–450)

## 2014-06-12 MED ORDER — FUROSEMIDE 10 MG/ML IJ SOLN
40.0000 mg | Freq: Two times a day (BID) | INTRAMUSCULAR | Status: DC
  Administered 2014-06-12 – 2014-06-13 (×3): 40 mg via INTRAVENOUS
  Filled 2014-06-12 (×4): qty 4

## 2014-06-12 NOTE — Progress Notes (Signed)
Subjective: Patient without complaints. Doing much better according to daughter   Objective: Vital signs in last 24 hours: Temp:  [97.9 F (36.6 C)-98.8 F (37.1 C)] 97.9 F (36.6 C) (09/07 0618) Pulse Rate:  [63-92] 63 (09/07 0618) Resp:  [16-18] 16 (09/07 0618) BP: (105-155)/(57-84) 155/84 mmHg (09/07 0618) SpO2:  [95 %-97 %] 96 % (09/07 0618) Weight:  [175 lb 3.2 oz (79.47 kg)] 175 lb 3.2 oz (79.47 kg) (09/07 0500)  Intake/Output from previous day: 09/06 0701 - 09/07 0700 In: 600 [P.O.:600] Out: 1200 [Urine:1200] Intake/Output this shift:    No results found for this basename: HGB,  in the last 72 hours No results found for this basename: WBC, RBC, HCT, PLT,  in the last 72 hours  Recent Labs  06/11/14 0440 06/12/14 0443  NA 141 141  K 3.0* 3.5*  CL 102 96  CO2 27 28  BUN 15 21  CREATININE 0.66 0.73  GLUCOSE 111* 128*  CALCIUM 9.0 9.3   No results found for this basename: LABPT, INR,  in the last 72 hours  No cellulitis present Compartment soft Left wrist not swollen or tender  Assessment/Plan: Left wrist pain- Doing much better. No swelling or tenderness. Will sign off   Holly Grimes V 06/12/2014, 10:13 AM

## 2014-06-12 NOTE — Progress Notes (Signed)
PROGRESS NOTE  Holly Grimes BCO:[id number] DOB: May 05, 1927 DOA: 06/08/2014 PCP: Bufford Spikes, DO  HPI/Recap of past 24 hours: Patient is an 80 year old white female past medical history of Parkinson's dementia and hypertension is from home with 24-hour care and was noted to have some pain and erythema of her left wrist. According to her family, she also looked to be more confused than her baseline. Patient brought in a evaluated by hospitalist service. See the orthopedic surgery who felt that this possibly could be inflammatory arthropathy, however patient ESR and sedimentation rate were quite elevated and they recommended antibiotic treatment as well.  Since admission, patient had been favoring keeping her head turned to the right with pain trying to turn her head the other direction.No history of heart failure but given persistently elevated blood pressures, BNP checked and found to be elevated at 4800. Started on IV Lasix and has diuresed 3.5 L since. Mentation much improved on 9/6.  Overnight, patient little bit more agitated and did not sleep until 4:00 in the morning. Today a little bit more somnolent and confused  Assessment/Plan: Principal Problem:   Swollen joint: We'll treat as cellulitis plus/minus inflammatory arthropathy. Responding well with significantly decreased pain in her arm. Tapering steroids Active Problems:   Hypertension: Patient noted to have some elevated blood pressures. On home medications. With diuresis, this should help. Titrate up lisinopril. Added beta blocker.   Acute diastolic heart failure: New diagnosis. Echo notes a grade 2 diastolic dysfunction. Have added lisinopril and then titrating up. Have added beta blocker at low dose. Down 6 pounds    Parkinson's Dementia with acute confusion: Suspect secondary to arthropathy/cellulitis. As above, started on IV clindamycin changed to by mouth    Obesity (BMI 30-39.9): Patient needs criteria with BMI greater than  30    Bipolar I disorder, most recent episode (or current) manic   Generalized weakness    Acute torticollis: Resolved. Unclear if this was actual next range versus patient's dementia, but she is comfortably moving her neck around now comfortably   Code Status:  DO NOT RESUSCITATE  Family Communication:  Spoke with daughter and son-in-law at the bedside  Disposition: Continue diuresis. Family would like placement. Likely Tuesday.  Consultants:   Hand surgery  Procedures:   None  Antibiotics:   Clindamycin IV, now by mouth: 9/4-present  HPI/Subjective: More reserved, is slightly confused. Not as animated. No complaints  Objective: BP 146/77  Pulse 71  Temp(Src) 98.1 F (36.7 C) (Axillary)  Resp 16  Ht 5\' 2"  (1.575 m)  Wt 79.47 kg (175 lb 3.2 oz)  BMI 32.04 kg/m2  SpO2 94%  LMP 08/22/2011  Intake/Output Summary (Last 24 hours) at 06/12/14 1501 Last data filed at 06/12/14 1300  Gross per 24 hour  Intake    360 ml  Output   2400 ml  Net  -2040 ml   Filed Weights   06/10/14 0427 06/11/14 0502 06/12/14 0500  Weight: 84.7 kg (186 lb 11.7 oz) 79.9 kg (176 lb 2.4 oz) 79.47 kg (175 lb 3.2 oz)    Exam:   General:  Awake, oriented x1  Cardiovascular:  Sinus rhythm  Respiratory:  Clear to auscultation bilaterally  Abdomen:  Soft, nontender, nondistended, positive bowel sounds  Musculoskeletal:  Trace edema. Erythema and tenderness by was resolved  Data Reviewed: Basic Metabolic Panel:  Recent Labs Lab 06/08/14 1642 06/09/14 0530 06/11/14 0440 06/12/14 0443  NA 135* 138 141 141  K 3.4* 3.1* 3.0* 3.5*  CL 95* 100 102 96  CO2 $Re'24 22 27 28  'FlP$ GLUCOSE 127* 106* 111* 128*  BUN $Re'15 8 15 21  'VzF$ CREATININE 0.68 0.57 0.66 0.73  CALCIUM 9.5 8.4 9.0 9.3   Liver Function Tests:  Recent Labs Lab 06/08/14 1642  AST 28  ALT 12  ALKPHOS 52  BILITOT 1.1  PROT 7.5  ALBUMIN 3.2*   No results found for this basename: LIPASE, AMYLASE,  in the last 168 hours No  results found for this basename: AMMONIA,  in the last 168 hours CBC:  Recent Labs Lab 06/08/14 1642 06/09/14 0530  WBC 9.5 9.0  NEUTROABS 7.5  --   HGB 13.8 12.6  HCT 40.9 38.3  MCV 98.3 98.7  PLT 177 150   Cardiac Enzymes:   No results found for this basename: CKTOTAL, CKMB, CKMBINDEX, TROPONINI,  in the last 168 hours BNP (last 3 results)  Recent Labs  06/09/14 1620 06/12/14 0443  PROBNP 4261.0* 2050.0*   CBG: No results found for this basename: GLUCAP,  in the last 168 hours  Recent Results (from the past 240 hour(s))  URINE CULTURE     Status: None   Collection Time    06/06/14  5:22 PM      Result Value Ref Range Status   Urine Culture, Routine Final report   Final   Result 1 Comment   Final   Comment: Mixed urogenital flora     Greater than 100,000 colony forming units per mL  CULTURE, BLOOD (ROUTINE X 2)     Status: None   Collection Time    06/08/14  4:42 PM      Result Value Ref Range Status   Specimen Description BLOOD RIGHT HAND   Final   Special Requests BOTTLES DRAWN AEROBIC AND ANAEROBIC 6 CC   Final   Culture  Setup Time     Final   Value: 06/08/2014 22:00     Performed at [facility]   Culture     Final   Value:        BLOOD CULTURE RECEIVED NO GROWTH TO DATE CULTURE WILL BE HELD FOR 5 DAYS BEFORE ISSUING A FINAL NEGATIVE REPORT     Performed at [facility]   Report Status PENDING   Incomplete  CULTURE, BLOOD (ROUTINE X 2)     Status: None   Collection Time    06/08/14  4:42 PM      Result Value Ref Range Status   Specimen Description BLOOD RIGHT WRIST   Final   Special Requests BOTTLES DRAWN AEROBIC AND ANAEROBIC 6 CC   Final   Culture  Setup Time     Final   Value: 06/08/2014 21:59     Performed at [facility]   Culture     Final   Value:        BLOOD CULTURE RECEIVED NO GROWTH TO DATE CULTURE WILL BE HELD FOR 5 DAYS BEFORE ISSUING A FINAL NEGATIVE REPORT     Performed at [facility]   Report Status  PENDING   Incomplete  URINE CULTURE     Status: None   Collection Time    06/08/14  5:10 PM      Result Value Ref Range Status   Specimen Description URINE, CLEAN CATCH   Final   Special Requests NONE   Final   Culture  Setup Time     Final   Value: 06/08/2014 21:23  Performed at [place]     Final   Value: NO GROWTH     Performed at [facility]   Culture     Final   Value: NO GROWTH     Performed at [facility]   Report Status 06/10/2014 FINAL   Final  MRSA PCR SCREENING     Status: Abnormal   Collection Time    06/09/14  4:47 AM      Result Value Ref Range Status   MRSA by PCR POSITIVE (*) NEGATIVE Final   Comment:            The GeneXpert MRSA Assay (FDA     approved for NASAL specimens     only), is one component of a     comprehensive MRSA colonization     surveillance program. It is not     intended to diagnose MRSA     infection nor to guide or     monitor treatment for     MRSA infections.     RESULT CALLED TO, READ BACK BY AND VERIFIED WITH:     N. MAYFIELD RN AT [phone] ON 09.04.15 BY SHUEA     Studies: No results found.  Scheduled Meds: . atorvastatin  20 mg Oral q1800  . citalopram  20 mg Oral Daily  . clindamycin  300 mg Oral 3 times per day  . clopidogrel  75 mg Oral Daily  . enoxaparin (LOVENOX) injection  40 mg Subcutaneous Q24H  . feeding supplement (ENSURE COMPLETE)  237 mL Oral Daily  . folic acid  0.5 mg Oral Daily  . furosemide  40 mg Intravenous Q12H  . lisinopril  5 mg Oral Daily  . Memantine HCl ER  28 mg Oral QHS  . mirabegron ER  50 mg Oral Daily  . pantoprazole  40 mg Oral Daily  . polyethylene glycol  17 g Oral Daily  . [START ON 06/13/2014] predniSONE  20 mg Oral Q breakfast  . rivastigmine  4.6 mg Transdermal Daily    Continuous Infusions:    Time spent: 15 minutes  [facility] Pager [phone]. If 7PM-7AM, please contact night-coverage at [email or web address],  password [facility] 06/12/2014, 3:01 PM  LOS: 4 days

## 2014-06-12 NOTE — Evaluation (Signed)
Occupational Therapy Evaluation Patient Details Name: Holly Grimes MRN: [id number] DOB: 09-Jun-1927 Today's Date: 06/12/2014    History of Present Illness 80 year old female with past medical history of Parkinson's dementia, hypertension, R hip hemiarthroplasty (12/14) from home with 24-hour care admitted 06/08/14 with pain and erythema of her left wrist and progressive weakness.   Clinical Impression   Pt was admitted for the above.  She is able to follow commands and would benefit from continued OT while in acute and also at SNF.  Pt had 24/7 caregivers at home and has had recent progressive weakness.  Goals will focus on toilet transfers and standing balance for caregivers to assist with adls:  Mod A of one is goal level set in acute    Follow Up Recommendations  SNF    Equipment Recommendations  None recommended by OT    Recommendations for Other Services       Precautions / Restrictions Precautions Precautions: Fall Restrictions Weight Bearing Restrictions: No      Mobility Bed Mobility   Bed Mobility: Supine to Sit Rolling: Total assist   Supine to sit: +2 for physical assistance;Total assist     General bed mobility comments: pt helped minimally with legs; used pad to assist trunk to EOB  Transfers                 General transfer comment: did not stand this session--had stood with PT yesterday, mod A x 2    Balance Overall balance assessment: Needs assistance Sitting-balance support: Single extremity supported;Feet unsupported   Sitting balance - Comments: variable support:  min guard to mod A sitting eob                                    ADL Overall ADL's : Needs assistance/impaired     Grooming: Minimal assistance;Sitting (did not want to comb hair)                                 General ADL Comments: pt had assistance with adls prior to admission.  She followed commands but needed cues/assistance for thoroughness.   Pt initially sat with min guard, but took more support as she realized tech was behind her.  Son came in at the end of the session and provided PLOF information.  Goals set with him     Vision                     Perception     Praxis      Pertinent Vitals/Pain Pain Assessment: Faces Pain Score: 4  Faces Pain Scale: Hurts little more Pain Location: c/o headache; pointed to R cheek; L wrist tender to touch     Hand Dominance Right   Extremity/Trunk Assessment Upper Extremity Assessment Upper Extremity Assessment: Generalized weakness (limited shoulder flexion approximately 80; tender L wrist)           Communication Communication Communication: HOH   Cognition Arousal/Alertness: Awake/alert Behavior During Therapy: WFL for tasks assessed/performed Overall Cognitive Status: History of cognitive impairments - at baseline                     General Comments       Exercises       Shoulder Instructions      Home Living Family/patient expects to  be discharged to:: Skilled nursing facility                                 Additional Comments: had 24/7--family and caregiver      Prior Functioning/Environment Level of Independence: Needs assistance        Comments: assistance with adls.  Walked to bathroom in day; used 3;1 at night due to frequency    OT Diagnosis: Generalized weakness;Acute pain   OT Problem List: Pain;Decreased strength;Decreased range of motion;Decreased activity tolerance;Impaired balance (sitting and/or standing);Decreased cognition   OT Treatment/Interventions: Self-care/ADL training;DME and/or AE instruction;Patient/family education;Balance training;Therapeutic activities;Cognitive remediation/compensation;Therapeutic exercise    OT Goals(Current goals can be found in the care plan section) Acute Rehab OT Goals Patient Stated Goal: none stated OT Goal Formulation: With family Time For Goal Achievement:  06/26/14 Potential to Achieve Goals: Good ADL Goals Pt Will Transfer to Toilet: with mod assist;stand pivot transfer;bedside commode Additional ADL Goal #1: pt will maintain static sitting for 2 minutes with mod A for adls  OT Frequency: Min 2X/week   Barriers to D/C:            Co-evaluation              End of Session    Activity Tolerance: Patient tolerated treatment well Patient left: in bed;with call bell/phone within reach;with bed alarm set   Time: 1422-1440 OT Time Calculation (min): 18 min Charges:  OT General Charges $OT Visit: 1 Procedure OT Evaluation $Initial OT Evaluation Tier I: 1 Procedure OT Treatments $Self Care/Home Management : 8-22 mins G-Codes:    Elyce Zollinger 2014/06/21, 4:55 PM Marica Otter, OTR/L [phone] 21-Jun-2014

## 2014-06-12 NOTE — Progress Notes (Deleted)
OT Cancellation Note  Patient Details Name: Holly Grimes MRN: [id number] DOB: 11/27/1926   Cancelled Treatment:    Reason Eval/Treat Not Completed: Other (comment)  [name] 06/12/2014, 4:17 PM

## 2014-06-12 NOTE — Progress Notes (Signed)
CSW continuing to follow for disposition planning.  CSW met with pt and pt son and pt daughter at bedside. CSW introduced self and explained role. CSW provided SNF bed offers to pt daughter. CSW clarified questions and provided support. Pt daughter plans to tour a few facilities today and will update CSW regarding decision for SNF.   CSW to continue to follow to assist with pt disposition to SNF.   Alison Murray, MSW, [place] Work [phone]

## 2014-06-13 DIAGNOSIS — F309 Manic episode, unspecified: Secondary | ICD-10-CM | POA: Diagnosis not present

## 2014-06-13 DIAGNOSIS — R269 Unspecified abnormalities of gait and mobility: Secondary | ICD-10-CM | POA: Diagnosis not present

## 2014-06-13 DIAGNOSIS — M6281 Muscle weakness (generalized): Secondary | ICD-10-CM | POA: Diagnosis not present

## 2014-06-13 DIAGNOSIS — I509 Heart failure, unspecified: Secondary | ICD-10-CM | POA: Diagnosis not present

## 2014-06-13 DIAGNOSIS — M25839 Other specified joint disorders, unspecified wrist: Secondary | ICD-10-CM | POA: Diagnosis not present

## 2014-06-13 DIAGNOSIS — I5030 Unspecified diastolic (congestive) heart failure: Secondary | ICD-10-CM | POA: Diagnosis not present

## 2014-06-13 DIAGNOSIS — G20 Parkinson's disease: Secondary | ICD-10-CM | POA: Diagnosis not present

## 2014-06-13 DIAGNOSIS — R110 Nausea: Secondary | ICD-10-CM | POA: Diagnosis not present

## 2014-06-13 DIAGNOSIS — K219 Gastro-esophageal reflux disease without esophagitis: Secondary | ICD-10-CM | POA: Diagnosis not present

## 2014-06-13 DIAGNOSIS — G20A1 Parkinson's disease without dyskinesia, without mention of fluctuations: Secondary | ICD-10-CM | POA: Diagnosis not present

## 2014-06-13 DIAGNOSIS — R112 Nausea with vomiting, unspecified: Secondary | ICD-10-CM | POA: Diagnosis not present

## 2014-06-13 DIAGNOSIS — M436 Torticollis: Secondary | ICD-10-CM | POA: Diagnosis not present

## 2014-06-13 DIAGNOSIS — I10 Essential (primary) hypertension: Secondary | ICD-10-CM | POA: Diagnosis not present

## 2014-06-13 DIAGNOSIS — K5900 Constipation, unspecified: Secondary | ICD-10-CM | POA: Diagnosis not present

## 2014-06-13 DIAGNOSIS — F0390 Unspecified dementia without behavioral disturbance: Secondary | ICD-10-CM | POA: Diagnosis not present

## 2014-06-13 DIAGNOSIS — R262 Difficulty in walking, not elsewhere classified: Secondary | ICD-10-CM | POA: Diagnosis not present

## 2014-06-13 LAB — BASIC METABOLIC PANEL
Anion gap: 12 (ref 5–15)
BUN: 25 mg/dL — AB (ref 6–23)
CALCIUM: 9.2 mg/dL (ref 8.4–10.5)
CO2: 33 mEq/L — ABNORMAL HIGH (ref 19–32)
Chloride: 98 mEq/L (ref 96–112)
Creatinine, Ser: 0.82 mg/dL (ref 0.50–1.10)
GFR calc Af Amer: 74 mL/min — ABNORMAL LOW (ref >90)
GFR calc non Af Amer: 64 mL/min — ABNORMAL LOW (ref >90)
GLUCOSE: 110 mg/dL — AB (ref 70–99)
Potassium: 3.4 mEq/L — ABNORMAL LOW (ref 3.7–5.3)
SODIUM: 143 meq/L (ref 137–147)

## 2014-06-13 MED ORDER — CARVEDILOL 6.25 MG PO TABS: 6.2500 mg | ORAL_TABLET | Freq: Two times a day (BID) | ORAL | Status: DC

## 2014-06-13 MED ORDER — FOSINOPRIL SODIUM 10 MG PO TABS: 10.0000 mg | ORAL_TABLET | Freq: Every day | ORAL | Status: DC

## 2014-06-13 MED ORDER — CLINDAMYCIN HCL 300 MG PO CAPS: 300.0000 mg | ORAL_CAPSULE | Freq: Three times a day (TID) | ORAL | Status: AC

## 2014-06-13 MED ORDER — FUROSEMIDE 20 MG PO TABS: 20.0000 mg | ORAL_TABLET | Freq: Every day | ORAL | Status: DC

## 2014-06-13 NOTE — Progress Notes (Signed)
Called [facility] to give report on patient 3 times - each time on hold greater than 10 minutes

## 2014-06-13 NOTE — Progress Notes (Signed)
CSW received notification that pt daughter interested in [facility] for potential option for placement for pt.   CSW attempted to make referral yesterday, but admissions at ALF was unavailable on the holiday. CSW made referral to [facility] this morning and facility plans to review information, but could not guarantee that decision would be made today.  CSW made pt daughter aware yesterday that if [facility] cannot provide decision then pt family will need to choose SNF facility for placement.   CSW to follow up with pt and pt family today regarding placement.  Loletta Specter, MSW, LCSW Clinical Social Work [phone]

## 2014-06-13 NOTE — Progress Notes (Addendum)
CSW received notification from pt daughter, [name] that pt daughter agreeable to placement at [facility].  CSW contacted facility and confirmed facility could accept pt today.   CSW faxed pt discharge information to [facility].   Pt daughter states that she wishes pt to pay privately for [employer] for pt transport to [facility].   CSW contacted [employer] and arranged transport for 5 pm. Daughter aware. Caregiver at bedside aware.   Discharge packet provided at bedside. RN provided phone number to call report.  No further social work needs identified at this time.  CSW signing off.   Loletta Specter, MSW, LCSW Clinical Social Work [phone]

## 2014-06-13 NOTE — Progress Notes (Addendum)
CSW continuing to follow.   CSW visited pt room, but pt daughter not present at bedside at this time.  CSW contacted pt daughter via telephone.  Pt daughter stated that pt family toured facilities yesterday. Pt daughter stated that pt family is interested in [facility], but only hesitation about facility is that it is far from pt daughter home. Pt daughter states that she plans to visit [facility] the way to the hospital in order to make decision regarding SNF. Pt daughter aware that pt likely ready for discharge today.   CSW discussed with pt daughter that CSW made referral to [facility]. Pt daughter stated that the more she thought about placement, the more she feels that pt will benefit from a rehab at [facility] stay following hospitalization.   CSW will follow up with pt daughter when pt daughter arrives to hospital in order to determine decision for SNF placement.   Awaiting MD to round and determine if pt medically ready for discharge.  CSW to continue to follow.  Addendum 11:06 am:   CSW received phone call from [facility]. Per [facility], pt was reviewed in facilities morning meeting and facility is now reporting that they do not feel that they can meet pt needs and concerned that pt insurance would not cover SNF stay.   CSW notified pt daughter via telephone.   Pt daughter plans to tour [facility] the way to the hospital and will make a decision based on the other facilities that offered a bed.  CSW to continue to follow.    Loletta Specter, MSW, LCSW Clinical Social Work [phone]

## 2014-06-13 NOTE — Discharge Summary (Signed)
Discharge Summary  Holly Grimes ZOX:[id number] DOB: 1927-08-17  PCP: Bufford Spikes, DO  Admit date: 06/08/2014 Discharge date: 06/13/2014  Time spent: 25 minutes  Recommendations for Outpatient Follow-up:  1. New medication: Coreg 6.25 by mouth twice a day 2. New medication: Fosinopril 10 mg by mouth daily 3. New medication: Lasix 20 mg by mouth daily 4. New medication: Clindamycin 300 mg every 8 hours x2 days  Discharge Diagnoses:  Active Hospital Problems   Diagnosis Date Noted  . Swollen joint 06/08/2014  . Acute diastolic heart failure 06/10/2014  . Acute torticollis 06/09/2014  . Generalized weakness 06/08/2014  . Confusion 06/08/2014  . Altered mental state 06/08/2014  . Bipolar I disorder, most recent episode (or current) manic 04/24/2014  . Obesity (BMI 30-39.9) 04/24/2014  . Dementia 09/26/2013  . Hypertension   . Parkinson's disease, Lewy body     Resolved Hospital Problems   Diagnosis Date Noted Date Resolved  No resolved problems to display.    Discharge Condition: Improved, being discharged to skilled nursing facility  Diet recommendation: Heart healthy  Filed Weights   06/11/14 0502 06/12/14 0500 06/13/14 0522  Weight: 79.9 kg (176 lb 2.4 oz) 79.47 kg (175 lb 3.2 oz) 79.4 kg (175 lb 0.7 oz)    History of present illness:  Patient is an 80 year old white female past medical history of Parkinson's dementia and hypertension is from home with 24-hour care and was noted to have some pain and erythema of her left wrist. According to her family, she also looked to be more confused than her baseline. Patient brought in and evaluated by hospitalist service   Hospital Course:     Swollen joint: Patient was evaluated by orthopedic surgery who felt that possibly her wrist to be related to an inflammatory arthropathy. However, a sed rate and CRP were both elevated say recommended antibiotic treatment as well. Patient started on clindamycin and over the next 2 days,  swelling and tenderness is much improved. She will continue 2 more days of antibiotic therapy to complete 7 days course.    Parkinson's disease, Lewy body/dementia with acute confusion: Acute confusion felt to be secondary to CHF plus cellulitis. Treatment as, her symptoms have resolved and her dementia is at her baseline.    Hypertension: Patient noted to have elevated blood pressure during hospitalization. Now on ACE inhibitor plus Coreg.    Obesity (BMI 30-39.9): Patient meets criteria with BMI greater than 30    Bipolar I disorder, most recent episode (or current) manic    Generalized weakness: For skilled nursing    Acute torticollis: Patient is favoring keeping her head turned to the right hand side. Unclear if this was torticollis versus just an issue with her dementia. Treated with muscle relaxers and after 2 days, patient was comfortably moving her head freely    Acute diastolic heart failure: Patient's blood pressures noted to be persistently elevated on admission. Concerned about the possibility for CHF and a BNP was ordered and found to be elevated at 4800. She started on IV Lasix. Echocardiogram pending grade 2 diastolic dysfunction. Since starting Lasix, patient has diuresed 4 L and is down about 7 pounds. By day of discharge, patient's urine output has been minimal and weight has not changed. She is currently at 175 pounds felt to be her dry weight.   Procedures:  2D Echo done 9/5: Grade 2 diastolic dysfunction, moderate MR  Consultations:  None  Discharge Exam: BP 160/84  Pulse 77  Temp(Src) 98.2 F (  36.8 C) (Axillary)  Resp 16  Ht  (1.575 m)  Wt 79.4 kg (175 lb 0.7 oz)  BMI 32.01 kg/m2  SpO2 96%  LMP 08/22/2011  General: Alert, oriented x1, chronic dementia Cardiovascular: Regular rate and rhythm, S1-S2, soft 2/6 systolic ejection murmur Respiratory: Clear to auscultation bilaterally  Discharge Instructions You were cared for by a hospitalist during  your hospital stay. If you have any questions about your discharge medications or the care you received while you were in the hospital after you are discharged, you can call the unit and asked to speak with the hospitalist on call if the hospitalist that took care of you is not available. Once you are discharged, your primary care physician will handle any further medical issues. Please note that NO REFILLS for any discharge medications will be authorized once you are discharged, as it is imperative that you return to your primary care physician (or establish a relationship with a primary care physician if you do not have one) for your aftercare needs so that they can reassess your need for medications and monitor your lab values.  Discharge Instructions   Diet - low sodium heart healthy    Complete by:  As directed      Increase activity slowly    Complete by:  As directed             Medication List         acetaminophen 650 MG CR tablet  Commonly known as:  TYLENOL  Take 650 mg by mouth. Take 650 mg by mouth every 8 hours formulation     albuterol 1.25 MG/3ML nebulizer solution  Commonly known as:  ACCUNEB  Take 3 mLs (1.25 mg total) by nebulization every 6 (six) hours as needed for wheezing.     ALIGN 4 MG Caps  Take 1 capsule (4 mg total) by mouth daily.     BENEFIBER Powd  Take by mouth See admin instructions. Use one capful with liquid as needed for constipation     calcium carbonate 600 MG Tabs tablet  Commonly known as:  OS-CAL  Take 600 mg by mouth 2 (two) times daily with a meal.     carvedilol 6.25 MG tablet  Commonly known as:  COREG  Take 1 tablet (6.25 mg total) by mouth 2 (two) times daily with a meal.     CENTRUM SILVER ULTRA WOMENS PO  Take 1 tablet by mouth every morning.     citalopram 40 MG tablet  Commonly known as:  CELEXA  Take 40 mg by mouth. 1/2 by mouth daily     clindamycin 300 MG capsule  Commonly known as:  CLEOCIN  Take 1 capsule (300 mg  total) by mouth 3 (three) times daily.     clopidogrel 75 MG tablet  Commonly known as:  PLAVIX  Take one tablet by mouth once daily to help circulation     docusate sodium 100 MG capsule  Commonly known as:  COLACE  Take 100 mg by mouth daily.     feeding supplement Liqd  Take 1 Container by mouth daily.     fluticasone 0.05 % cream  Commonly known as:  CUTIVATE  Apply 1 application topically 2 (two) times daily. To affected areas as needed for skin irritaion     folic acid 400 MCG tablet  Commonly known as:  FOLVITE  Take 400 mcg by mouth daily.     fosinopril 10 MG tablet  Commonly known  as:  MONOPRIL  Take 1 tablet (10 mg total) by mouth daily.     furosemide 20 MG tablet  Commonly known as:  LASIX  Take 1 tablet (20 mg total) by mouth daily.     haloperidol 0.5 MG tablet  Commonly known as:  HALDOL  Take one tablet by mouth once daily as needed for agitation/psychosis     Memantine HCl ER 28 MG Cp24  Commonly known as:  NAMENDA XR  Take 28 mg by mouth at bedtime. Take one tablet once a day to preserve memory     mirabegron ER 50 MG Tb24 tablet  Commonly known as:  MYRBETRIQ  Take 1 tablet (50 mg total) by mouth daily.     MUCINEX MAXIMUM STRENGTH 1200 MG Tb12  Generic drug:  Guaifenesin  Take by mouth 2 (two) times daily.     nystatin 100000 UNIT/GM Powd  2 times a day for 2 weeks in LT auxillia     omega-3 acid ethyl esters 1 G capsule  Commonly known as:  LOVAZA  Take 2 capsules (2 g total) by mouth 2 (two) times daily.     omeprazole 20 MG capsule  Commonly known as:  PRILOSEC  Take one capsule by mouth every day to reduce stomach acid secretion     rivastigmine 4.6 mg/24hr  Commonly known as:  EXELON  Place 1 patch onto the skin daily.     rosuvastatin 10 MG tablet  Commonly known as:  CRESTOR  Take 1 tablet (10 mg total) by mouth at bedtime.     vitamin C with rose hips 1000 MG tablet  Take 1,000 mg by mouth daily.     Vitamin D3 2000 UNITS  Tabs  Take 2,000 Units by mouth daily.       Allergies  Allergen Reactions  . Cephalexin Nausea And Vomiting  . Sulfa Antibiotics Other (See Comments)    Daughter unsure of reaction, but believes it is severe  . Sulfites     unknown      The results of significant diagnostics from this hospitalization (including imaging, microbiology, ancillary and laboratory) are listed below for reference.    Significant Diagnostic Studies: Dg Chest 2 View  06/08/2014   .  IMPRESSION: No acute cardiopulmonary process.   Electronically Signed   By: Annia Belt M.D.   On: 06/08/2014 17:52   Dg Wrist Complete Left  06/08/2014   IMPRESSION: Degenerative changes at the radial border of the carpus with question CPPD.  Osseous demineralization and soft tissue swelling.  No acute abnormalities.   Electronically Signed   By: Ulyses Southward M.D.   On: 06/08/2014 17:52   Ct Head Wo Contrast  06/08/2014     IMPRESSION: 1. Negative for bleed or other acute intracranial process. 2. Stable atrophy and nonspecific white matter changes.   Electronically Signed   By: Oley Balm M.D.   On: 06/08/2014 19:26    Microbiology: Recent Results (from the past 240 hour(s))  URINE CULTURE     Status: None   Collection Time    06/06/14  5:22 PM      Result Value Ref Range Status   Urine Culture, Routine Final report   Final   Result 1 Comment   Final   Comment: Mixed urogenital flora     Greater than 100,000 colony forming units per mL  CULTURE, BLOOD (ROUTINE X 2)     Status: None   Collection Time  06/08/14  4:42 PM      Result Value Ref Range Status   Specimen Description BLOOD RIGHT HAND   Final   Special Requests BOTTLES DRAWN AEROBIC AND ANAEROBIC 6 CC   Final   Culture  Setup Time     Final   Value: 06/08/2014 22:00     Performed at [facility]   Culture     Final   Value:        BLOOD CULTURE RECEIVED NO GROWTH TO DATE CULTURE WILL BE HELD FOR 5 DAYS BEFORE ISSUING A FINAL NEGATIVE REPORT      Performed at [facility]   Report Status PENDING   Incomplete  CULTURE, BLOOD (ROUTINE X 2)     Status: None   Collection Time    06/08/14  4:42 PM      Result Value Ref Range Status   Specimen Description BLOOD RIGHT WRIST   Final   Special Requests BOTTLES DRAWN AEROBIC AND ANAEROBIC 6 CC   Final   Culture  Setup Time     Final   Value: 06/08/2014 21:59     Performed at [facility]   Culture     Final   Value:        BLOOD CULTURE RECEIVED NO GROWTH TO DATE CULTURE WILL BE HELD FOR 5 DAYS BEFORE ISSUING A FINAL NEGATIVE REPORT     Performed at [facility]   Report Status PENDING   Incomplete  URINE CULTURE     Status: None   Collection Time    06/08/14  5:10 PM      Result Value Ref Range Status   Specimen Description URINE, CLEAN CATCH   Final   Special Requests NONE   Final   Culture  Setup Time     Final   Value: 06/08/2014 21:23     Performed at [facility] Count     Final   Value: NO GROWTH     Performed at [facility]   Culture     Final   Value: NO GROWTH     Performed at [facility]   Report Status 06/10/2014 FINAL   Final  MRSA PCR SCREENING     Status: Abnormal   Collection Time    06/09/14  4:47 AM      Result Value Ref Range Status   MRSA by PCR POSITIVE (*) NEGATIVE Final   Comment:            The GeneXpert MRSA Assay (FDA     approved for NASAL specimens     only), is one component of a     comprehensive MRSA colonization     surveillance program. It is not     intended to diagnose MRSA     infection nor to guide or     monitor treatment for     MRSA infections.     RESULT CALLED TO, READ BACK BY AND VERIFIED WITH:     N. MAYFIELD RN AT [phone] ON 09.04.15 BY SHUEA     Labs: Basic Metabolic Panel:  Recent Labs Lab 06/08/14 1642 06/09/14 0530 06/11/14 0440 06/12/14 0443 06/13/14 0400  NA 135* 138 141 141 143  K 3.4* 3.1* 3.0* 3.5* 3.4*  CL 95* 100 102 96 98  CO2 33*    GLUCOSE 127* 106* 111* 128* 110*  BUN 25*  CREATININE  0.68 0.57 0.66 0.73 0.82  CALCIUM 9.5 8.4 9.0 9.3 9.2   Liver Function Tests:  Recent Labs Lab 06/08/14 1642  AST 28  ALT 12  ALKPHOS 52  BILITOT 1.1  PROT 7.5  ALBUMIN 3.2*   No results found for this basename: LIPASE, AMYLASE,  in the last 168 hours No results found for this basename: AMMONIA,  in the last 168 hours CBC:  Recent Labs Lab 06/08/14 1642 06/09/14 0530  WBC 9.5 9.0  NEUTROABS 7.5  --   HGB 13.8 12.6  HCT 40.9 38.3  MCV 98.3 98.7  PLT 177 150     Signed:  Olimpia Tinch K  [facility] 06/13/2014, 12:51 PM

## 2014-06-14 DIAGNOSIS — F309 Manic episode, unspecified: Secondary | ICD-10-CM | POA: Diagnosis not present

## 2014-06-14 DIAGNOSIS — I10 Essential (primary) hypertension: Secondary | ICD-10-CM | POA: Diagnosis not present

## 2014-06-14 DIAGNOSIS — I509 Heart failure, unspecified: Secondary | ICD-10-CM | POA: Diagnosis not present

## 2014-06-14 DIAGNOSIS — R112 Nausea with vomiting, unspecified: Secondary | ICD-10-CM | POA: Diagnosis not present

## 2014-06-14 DIAGNOSIS — F0390 Unspecified dementia without behavioral disturbance: Secondary | ICD-10-CM | POA: Diagnosis not present

## 2014-06-14 DIAGNOSIS — K5900 Constipation, unspecified: Secondary | ICD-10-CM | POA: Diagnosis not present

## 2014-06-14 DIAGNOSIS — K219 Gastro-esophageal reflux disease without esophagitis: Secondary | ICD-10-CM | POA: Diagnosis not present

## 2014-06-14 LAB — CULTURE, BLOOD (ROUTINE X 2)
CULTURE: NO GROWTH
Culture: NO GROWTH

## 2014-06-15 ENCOUNTER — Ambulatory Visit: Payer: Medicare Other | Admitting: Nurse Practitioner

## 2014-06-15 DIAGNOSIS — R110 Nausea: Secondary | ICD-10-CM | POA: Diagnosis not present

## 2014-06-15 DIAGNOSIS — K219 Gastro-esophageal reflux disease without esophagitis: Secondary | ICD-10-CM | POA: Diagnosis not present

## 2014-06-15 DIAGNOSIS — K5900 Constipation, unspecified: Secondary | ICD-10-CM | POA: Diagnosis not present

## 2014-06-16 DIAGNOSIS — R269 Unspecified abnormalities of gait and mobility: Secondary | ICD-10-CM | POA: Diagnosis not present

## 2014-06-16 DIAGNOSIS — M25839 Other specified joint disorders, unspecified wrist: Secondary | ICD-10-CM | POA: Diagnosis not present

## 2014-06-16 DIAGNOSIS — G20 Parkinson's disease: Secondary | ICD-10-CM | POA: Diagnosis not present

## 2014-06-16 DIAGNOSIS — F0390 Unspecified dementia without behavioral disturbance: Secondary | ICD-10-CM | POA: Diagnosis not present

## 2014-06-16 DIAGNOSIS — I509 Heart failure, unspecified: Secondary | ICD-10-CM | POA: Diagnosis not present

## 2014-06-29 ENCOUNTER — Telehealth: Payer: Self-pay | Admitting: *Deleted

## 2014-06-29 NOTE — Telephone Encounter (Signed)
Daughter, [name] called and stated that patient has been released from the hospital to Rehab. And left voicemail that she had concerns she wanted to discuss.  Tried calling both numbers left and No answer and could not leave voicemail.

## 2014-07-07 ENCOUNTER — Other Ambulatory Visit: Payer: Self-pay | Admitting: *Deleted

## 2014-07-07 MED ORDER — HALOPERIDOL 0.5 MG PO TABS: ORAL_TABLET | ORAL | Status: DC

## 2014-07-07 NOTE — Telephone Encounter (Signed)
[facility] sent an order for Rx request and stating Patient was a new admit today and needed Rx and Medication list signed off on. Given to Dr. Renato Gailseed.

## 2014-07-12 DIAGNOSIS — F0391 Unspecified dementia with behavioral disturbance: Secondary | ICD-10-CM

## 2014-07-12 DIAGNOSIS — M436 Torticollis: Secondary | ICD-10-CM | POA: Diagnosis not present

## 2014-07-12 DIAGNOSIS — G20 Parkinson's disease: Secondary | ICD-10-CM

## 2014-07-12 DIAGNOSIS — R269 Unspecified abnormalities of gait and mobility: Secondary | ICD-10-CM | POA: Diagnosis not present

## 2014-07-12 DIAGNOSIS — I509 Heart failure, unspecified: Secondary | ICD-10-CM

## 2014-07-12 DIAGNOSIS — Z9181 History of falling: Secondary | ICD-10-CM | POA: Diagnosis not present

## 2014-07-12 DIAGNOSIS — M6281 Muscle weakness (generalized): Secondary | ICD-10-CM

## 2014-07-13 DIAGNOSIS — Z9181 History of falling: Secondary | ICD-10-CM | POA: Diagnosis not present

## 2014-07-13 DIAGNOSIS — G20 Parkinson's disease: Secondary | ICD-10-CM | POA: Diagnosis not present

## 2014-07-13 DIAGNOSIS — I509 Heart failure, unspecified: Secondary | ICD-10-CM | POA: Diagnosis not present

## 2014-07-13 DIAGNOSIS — M436 Torticollis: Secondary | ICD-10-CM | POA: Diagnosis not present

## 2014-07-13 DIAGNOSIS — F0391 Unspecified dementia with behavioral disturbance: Secondary | ICD-10-CM | POA: Diagnosis not present

## 2014-07-13 DIAGNOSIS — M6281 Muscle weakness (generalized): Secondary | ICD-10-CM | POA: Diagnosis not present

## 2014-07-14 DIAGNOSIS — I509 Heart failure, unspecified: Secondary | ICD-10-CM | POA: Diagnosis not present

## 2014-07-14 DIAGNOSIS — Z9181 History of falling: Secondary | ICD-10-CM | POA: Diagnosis not present

## 2014-07-14 DIAGNOSIS — M436 Torticollis: Secondary | ICD-10-CM | POA: Diagnosis not present

## 2014-07-14 DIAGNOSIS — F0391 Unspecified dementia with behavioral disturbance: Secondary | ICD-10-CM | POA: Diagnosis not present

## 2014-07-14 DIAGNOSIS — G20 Parkinson's disease: Secondary | ICD-10-CM | POA: Diagnosis not present

## 2014-07-14 DIAGNOSIS — M6281 Muscle weakness (generalized): Secondary | ICD-10-CM | POA: Diagnosis not present

## 2014-07-18 DIAGNOSIS — G20 Parkinson's disease: Secondary | ICD-10-CM | POA: Diagnosis not present

## 2014-07-18 DIAGNOSIS — M436 Torticollis: Secondary | ICD-10-CM | POA: Diagnosis not present

## 2014-07-18 DIAGNOSIS — F0391 Unspecified dementia with behavioral disturbance: Secondary | ICD-10-CM | POA: Diagnosis not present

## 2014-07-18 DIAGNOSIS — Z9181 History of falling: Secondary | ICD-10-CM | POA: Diagnosis not present

## 2014-07-18 DIAGNOSIS — I509 Heart failure, unspecified: Secondary | ICD-10-CM | POA: Diagnosis not present

## 2014-07-18 DIAGNOSIS — M6281 Muscle weakness (generalized): Secondary | ICD-10-CM | POA: Diagnosis not present

## 2014-07-20 DIAGNOSIS — I509 Heart failure, unspecified: Secondary | ICD-10-CM | POA: Diagnosis not present

## 2014-07-20 DIAGNOSIS — M6281 Muscle weakness (generalized): Secondary | ICD-10-CM | POA: Diagnosis not present

## 2014-07-20 DIAGNOSIS — Z9181 History of falling: Secondary | ICD-10-CM | POA: Diagnosis not present

## 2014-07-20 DIAGNOSIS — F0391 Unspecified dementia with behavioral disturbance: Secondary | ICD-10-CM | POA: Diagnosis not present

## 2014-07-20 DIAGNOSIS — G20 Parkinson's disease: Secondary | ICD-10-CM | POA: Diagnosis not present

## 2014-07-20 DIAGNOSIS — M436 Torticollis: Secondary | ICD-10-CM | POA: Diagnosis not present

## 2014-07-21 DIAGNOSIS — Z23 Encounter for immunization: Secondary | ICD-10-CM | POA: Diagnosis not present

## 2014-07-21 DIAGNOSIS — Z9181 History of falling: Secondary | ICD-10-CM | POA: Diagnosis not present

## 2014-07-21 DIAGNOSIS — I509 Heart failure, unspecified: Secondary | ICD-10-CM | POA: Diagnosis not present

## 2014-07-21 DIAGNOSIS — M6281 Muscle weakness (generalized): Secondary | ICD-10-CM | POA: Diagnosis not present

## 2014-07-21 DIAGNOSIS — F0391 Unspecified dementia with behavioral disturbance: Secondary | ICD-10-CM | POA: Diagnosis not present

## 2014-07-21 DIAGNOSIS — M436 Torticollis: Secondary | ICD-10-CM | POA: Diagnosis not present

## 2014-07-21 DIAGNOSIS — G20 Parkinson's disease: Secondary | ICD-10-CM | POA: Diagnosis not present

## 2014-07-24 DIAGNOSIS — I509 Heart failure, unspecified: Secondary | ICD-10-CM | POA: Diagnosis not present

## 2014-07-24 DIAGNOSIS — M436 Torticollis: Secondary | ICD-10-CM | POA: Diagnosis not present

## 2014-07-24 DIAGNOSIS — G20 Parkinson's disease: Secondary | ICD-10-CM | POA: Diagnosis not present

## 2014-07-24 DIAGNOSIS — Z9181 History of falling: Secondary | ICD-10-CM | POA: Diagnosis not present

## 2014-07-24 DIAGNOSIS — F0391 Unspecified dementia with behavioral disturbance: Secondary | ICD-10-CM | POA: Diagnosis not present

## 2014-07-24 DIAGNOSIS — M6281 Muscle weakness (generalized): Secondary | ICD-10-CM | POA: Diagnosis not present

## 2014-07-25 DIAGNOSIS — M436 Torticollis: Secondary | ICD-10-CM | POA: Diagnosis not present

## 2014-07-25 DIAGNOSIS — G20 Parkinson's disease: Secondary | ICD-10-CM | POA: Diagnosis not present

## 2014-07-25 DIAGNOSIS — I509 Heart failure, unspecified: Secondary | ICD-10-CM | POA: Diagnosis not present

## 2014-07-25 DIAGNOSIS — F0391 Unspecified dementia with behavioral disturbance: Secondary | ICD-10-CM | POA: Diagnosis not present

## 2014-07-25 DIAGNOSIS — Z9181 History of falling: Secondary | ICD-10-CM | POA: Diagnosis not present

## 2014-07-25 DIAGNOSIS — M6281 Muscle weakness (generalized): Secondary | ICD-10-CM | POA: Diagnosis not present

## 2014-07-26 DIAGNOSIS — M6281 Muscle weakness (generalized): Secondary | ICD-10-CM | POA: Diagnosis not present

## 2014-07-26 DIAGNOSIS — G20 Parkinson's disease: Secondary | ICD-10-CM | POA: Diagnosis not present

## 2014-07-26 DIAGNOSIS — M436 Torticollis: Secondary | ICD-10-CM | POA: Diagnosis not present

## 2014-07-26 DIAGNOSIS — Z9181 History of falling: Secondary | ICD-10-CM | POA: Diagnosis not present

## 2014-07-26 DIAGNOSIS — F0391 Unspecified dementia with behavioral disturbance: Secondary | ICD-10-CM | POA: Diagnosis not present

## 2014-07-26 DIAGNOSIS — I509 Heart failure, unspecified: Secondary | ICD-10-CM | POA: Diagnosis not present

## 2014-07-27 ENCOUNTER — Ambulatory Visit (INDEPENDENT_AMBULATORY_CARE_PROVIDER_SITE_OTHER): Payer: Medicare Other | Admitting: Internal Medicine

## 2014-07-27 ENCOUNTER — Encounter: Payer: Self-pay | Admitting: Internal Medicine

## 2014-07-27 VITALS — BP 110/68 | HR 69 | Temp 98.4°F | Resp 18 | Ht 62.0 in | Wt 185.2 lb

## 2014-07-27 DIAGNOSIS — B372 Candidiasis of skin and nail: Secondary | ICD-10-CM

## 2014-07-27 DIAGNOSIS — G3183 Dementia with Lewy bodies: Secondary | ICD-10-CM

## 2014-07-27 DIAGNOSIS — F0280 Dementia in other diseases classified elsewhere without behavioral disturbance: Secondary | ICD-10-CM

## 2014-07-27 DIAGNOSIS — G20 Parkinson's disease: Secondary | ICD-10-CM | POA: Diagnosis not present

## 2014-07-27 DIAGNOSIS — I5031 Acute diastolic (congestive) heart failure: Secondary | ICD-10-CM

## 2014-07-27 DIAGNOSIS — I5032 Chronic diastolic (congestive) heart failure: Secondary | ICD-10-CM

## 2014-07-27 MED ORDER — POTASSIUM CHLORIDE CRYS ER 20 MEQ PO TBCR: 20.0000 meq | EXTENDED_RELEASE_TABLET | Freq: Every day | ORAL | Status: DC

## 2014-07-27 MED ORDER — NYSTATIN 100000 UNIT/GM EX POWD: CUTANEOUS | Status: DC

## 2014-07-27 MED ORDER — FOSINOPRIL SODIUM 10 MG PO TABS: 10.0000 mg | ORAL_TABLET | Freq: Every day | ORAL | Status: DC

## 2014-07-27 MED ORDER — CARVEDILOL 6.25 MG PO TABS: 6.2500 mg | ORAL_TABLET | Freq: Two times a day (BID) | ORAL | Status: DC

## 2014-07-27 MED ORDER — FUROSEMIDE 40 MG PO TABS: 40.0000 mg | ORAL_TABLET | Freq: Every day | ORAL | Status: DC

## 2014-07-27 MED ORDER — FLUTICASONE PROPIONATE 0.05 % EX CREA
1.0000 | TOPICAL_CREAM | Freq: Two times a day (BID) | CUTANEOUS | Status: DC
Start: 2014-07-27 — End: 2015-01-08

## 2014-07-27 NOTE — Progress Notes (Signed)
Patient ID: Holly Grimes, female   DOB: 1927-01-24, 80 y.o.   MRN: [id number]   Location:  [facility]  Code Status: DNR  Allergies  Allergen Reactions  . Cephalexin Nausea And Vomiting  . Sulfa Antibiotics Other (See Comments)    Daughter unsure of reaction, but believes it is severe  . Sulfites     unknown    Chief Complaint  Patient presents with  . Hospitalization Follow-up    HPI: Patient is a 80 y.o. white female seen in the office today for hospital follow up.    Went to [facility]. Had been weak and having trouble standing.  BPs were weird in the ED--very high. Wound up being admitted. Incidentally found CHF (has grade 2 diastolic dysfunction--ace was added).  Arm was swollen with celluliitis.  Then had pseudogout of hand and wrist.  Uric acid was normal.  Also had acute torticollis.    [facility].  Now 185 lbs here.  [facility] are not [facility].  Supposedly she weighed 159 there.  Diet different from what it was at home.  Have dessert before meal complete and will give extra desserts.    2 days ago, had coughing, hacking, weird breathing in her sleep.  Thought she was going to vomit.  Had infected appearing phlegm and other stuff--yellow and brown material.  2 tbs of material.  Was breathing weird again later in evening.  No fever, but has had tylenol.    Review of Systems:  Review of Systems  Unable to perform ROS: dementia    Past Medical History  Diagnosis Date  . Hypertension   . High cholesterol   . Mitral valve prolapse   . Dementia   . Parkinson's disease, Lewy body   . Unspecified constipation   . Onychia and paronychia of toe   . Spasm of muscle   . Abnormality of gait   . Orthostatic hypotension   . Major depressive disorder, single episode, unspecified   . Diaphragmatic hernia without mention of obstruction or gangrene   . Alzheimer's disease   .  Osteoarthrosis, unspecified whether generalized or localized, unspecified site   . Osteoporosis, unspecified   . Unspecified constipation   . Sprain of ribs   . Delirium due to conditions classified elsewhere   . Diarrhea   . Edema   . Pain in joint, pelvic region and thigh   . Other malaise and fatigue   . Urinary frequency [id number]  . Personal history of fall   . Female stress incontinence   . Other and unspecified hyperlipidemia   . Obesity, unspecified   . Osteoarthrosis, unspecified whether generalized or localized, unspecified site   . Pain in joint, shoulder region   . Transient ischemic attack (TIA), and cerebral infarction without residual deficits(V12.54)     Past Surgical History  Procedure Laterality Date  . Joint replacement    . Tonsillectomy    . Ankle surgery Left 1998  . Total knee arthroplasty Right 2002  . Total knee arthroplasty Left 2005  . Hip arthroplasty Right 09/24/2013    Procedure: ARTHROPLASTY BIPOLAR HIP;  Surgeon: Loanne DrillingFrank V Aluisio, MD;  Location: [facility];  Service: Orthopedics;  Laterality: Right;    Social History:   reports that she has never smoked. She does not have any smokeless tobacco history on file. She reports that she does not drink alcohol or use  illicit drugs.  Family History  Problem Relation Age of Onset  . Adopted: Yes    Medications: Patient's Medications  New Prescriptions   No medications on file  Previous Medications   ACETAMINOPHEN (TYLENOL) 650 MG CR TABLET    Take 650 mg by mouth. Take 650 mg by mouth every 8 hours formulation   ALBUTEROL (ACCUNEB) 1.25 MG/3ML NEBULIZER SOLUTION    Take 3 mLs (1.25 mg total) by nebulization every 6 (six) hours as needed for wheezing.   ASCORBIC ACID (VITAMIN C WITH ROSE HIPS) 1000 MG TABLET    Take 1,000 mg by mouth daily.     CALCIUM CARBONATE (OS-CAL) 600 MG TABS TABLET    Take 600 mg by mouth 2 (two) times daily with a meal.   CARVEDILOL (COREG) 6.25 MG TABLET    Take 1 tablet (6.25  mg total) by mouth 2 (two) times daily with a meal.   CHOLECALCIFEROL (VITAMIN D3) 2000 UNITS TABS    Take 2,000 Units by mouth daily.    CITALOPRAM (CELEXA) 40 MG TABLET    Take 20 mg by mouth. 1/2 by mouth daily   CLOPIDOGREL (PLAVIX) 75 MG TABLET    Take one tablet by mouth once daily to help circulation   DOCUSATE SODIUM (COLACE) 100 MG CAPSULE    Take 100 mg by mouth at bedtime.    FEEDING SUPPLEMENT (BOOST HIGH PROTEIN) LIQD    Take 1 Container by mouth daily.   FLUTICASONE (CUTIVATE) 0.05 % CREAM    Apply 1 application topically 2 (two) times daily. To affected areas as needed for skin irritaion   FOLIC ACID (FOLVITE) 400 MCG TABLET    Take 400 mcg by mouth daily.   FOSINOPRIL (MONOPRIL) 10 MG TABLET    Take 1 tablet (10 mg total) by mouth daily.   GUAIFENESIN (MUCINEX MAXIMUM STRENGTH) 1200 MG TB12    Take by mouth 2 (two) times daily.   HALOPERIDOL (HALDOL) 0.5 MG TABLET    Take one tablet by mouth once daily as needed for agitation/psychosis   MEMANTINE HCL ER (NAMENDA XR) 28 MG CP24    Take 28 mg by mouth at bedtime. Take one tablet once a day to preserve memory   MIRABEGRON ER (MYRBETRIQ) 50 MG TB24 TABLET    Take 1 tablet (50 mg total) by mouth daily.   MULTIPLE VITAMINS-MINERALS (CENTRUM SILVER ULTRA WOMENS PO)    Take 1 tablet by mouth every morning.     NYSTATIN (MYCOSTATIN/NYSTOP) 100000 UNIT/GM POWD    2 times a day for 2 weeks in LT auxillia   OMEGA-3 ACID ETHYL ESTERS (LOVAZA) 1 G CAPSULE    Take 2 capsules (2 g total) by mouth 2 (two) times daily.   OMEPRAZOLE (PRILOSEC) 20 MG CAPSULE    Take one capsule by mouth every day to reduce stomach acid secretion   POLYETHYLENE GLYCOL (MIRALAX / GLYCOLAX) PACKET    Take 17 g by mouth daily.   PROBIOTIC PRODUCT (ALIGN) 4 MG CAPS    Take 1 capsule (4 mg total) by mouth daily.   RIVASTIGMINE (EXELON) 4.6 MG/24HR    Place 1 patch onto the skin daily.    ROSUVASTATIN (CRESTOR) 10 MG TABLET    Take 1 tablet (10 mg total) by mouth at  bedtime.   WHEAT DEXTRIN (BENEFIBER) POWD    Take by mouth See admin instructions. Use one capful with liquid as needed for constipation  Modified Medications   Modified Medication Previous Medication  FUROSEMIDE (LASIX) 20 MG TABLET furosemide (LASIX) 20 MG tablet      Take 40 mg by mouth daily.    Take 1 tablet (20 mg total) by mouth daily.  Discontinued Medications   No medications on file     Physical Exam: Filed Vitals:   07/27/14 1424  BP: 110/68  Pulse: 69  Temp: 98.4 F (36.9 C)  TempSrc: Oral  Resp: 18  Height: 5\' 2"  (1.575 m)  Weight: 185 lb 3.2 oz (84.006 kg)  SpO2: 97%  Physical Exam  Constitutional: She appears well-developed and well-nourished. No distress.  HENT:  Head: Normocephalic and atraumatic.  Cardiovascular: Normal rate, regular rhythm, normal heart sounds and intact distal pulses.   Pulmonary/Chest: Effort normal. She has rales.  Right base  Abdominal: Soft. Bowel sounds are normal. She exhibits no distension and no mass. There is no tenderness.  Musculoskeletal: Normal range of motion. She exhibits no edema and no tenderness.  Neurological: She is alert.  Oriented to person only  Skin: Skin is warm and dry. There is pallor.  Psychiatric:  Very pleasant lady sitting on her rollator walker    Labs reviewed: Basic Metabolic Panel:  Recent Labs  14/78/2908/03/20 0440 06/12/14 0443 06/13/14 0400  NA 141 141 143  K 3.0* 3.5* 3.4*  CL 102 96 98  CO2 27 28 33*  GLUCOSE 111* 128* 110*  BUN 15 21 25*  CREATININE 0.66 0.73 0.82  CALCIUM 9.0 9.3 9.2   Liver Function Tests:  Recent Labs  06/08/14 1642  AST 28  ALT 12  ALKPHOS 52  BILITOT 1.1  PROT 7.5  ALBUMIN 3.2*   No results found for this basename: LIPASE, AMYLASE,  in the last 8760 hours No results found for this basename: AMMONIA,  in the last 8760 hours CBC:  Recent Labs  11/30/13 1544 01/19/14 1611 06/08/14 1642 06/09/14 0530  WBC 6.5 5.0 9.5 9.0  NEUTROABS 3.3 2.7 7.5  --     HGB 11.9 12.7 13.8 12.6  HCT 36.3 38.9 40.9 38.3  MCV 94 95 98.3 98.7  PLT 320 209 177 150   Lipid Panel: No results found for this basename: CHOL, HDL, LDLCALC, TRIG, CHOLHDL, LDLDIRECT,  in the last 8760 hours No results found for this basename: HGBA1C    Assessment/Plan 1. Acute diastolic heart failure - has crackles at right base and pitting edema 2+ bilaterally with severe weight gain -[facility] needs to be doing daily weights that are accurate at the same time each day -check potassium b/c she's not on any and is taking lasix 40meq daily -increase lasix to bid with kcl 20 daily for 3 days, then back to daily lasix - Basic metabolic panel - potassium chloride SA (K-DUR,KLOR-CON) 20 MEQ tablet; Take 1 tablet (20 mEq total) by mouth daily.  Dispense: 30 tablet; Refill: 3  2. Candidal skin infection -refilled powder - nystatin (MYCOSTATIN/NYSTOP) 100000 UNIT/GM POWD; 2 times a day for 2 weeks in LT [place]  Dispense: 30 g; Refill: 1 - Basic metabolic panel  3. Chronic diastolic heart failure - haivng acute exacerbation - Basic metabolic panel - potassium chloride SA (K-DUR,KLOR-CON) 20 MEQ tablet; Take 1 tablet (20 mEq total) by mouth daily.  Dispense: 30 tablet; Refill: 3  4. Parkinson's disease, Lewy body -stable at this time - Basic metabolic panel  Labs/tests ordered:    Orders Placed This Encounter  Procedures  . Basic metabolic panel   Next appt:  3 mos  Anila Bojarski L.  Rocklyn Mayberry, D.O. [facility] [address], [place] [address] Cell Phone (Mon-Fri 8am-5pm):  [phone] On Call:  [phone] & follow prompts after 5pm & weekends Office Phone:  [phone] Office Fax:  [phone]

## 2014-07-28 DIAGNOSIS — Z9181 History of falling: Secondary | ICD-10-CM | POA: Diagnosis not present

## 2014-07-28 DIAGNOSIS — I509 Heart failure, unspecified: Secondary | ICD-10-CM | POA: Diagnosis not present

## 2014-07-28 DIAGNOSIS — F0391 Unspecified dementia with behavioral disturbance: Secondary | ICD-10-CM | POA: Diagnosis not present

## 2014-07-28 DIAGNOSIS — M436 Torticollis: Secondary | ICD-10-CM | POA: Diagnosis not present

## 2014-07-28 DIAGNOSIS — G20 Parkinson's disease: Secondary | ICD-10-CM | POA: Diagnosis not present

## 2014-07-28 DIAGNOSIS — M6281 Muscle weakness (generalized): Secondary | ICD-10-CM | POA: Diagnosis not present

## 2014-07-28 LAB — BASIC METABOLIC PANEL
BUN/Creatinine Ratio: 17 (ref 11–26)
BUN: 16 mg/dL (ref 8–27)
CO2: 28 mmol/L (ref 18–29)
Calcium: 9.2 mg/dL (ref 8.7–10.3)
Chloride: 99 mmol/L (ref 97–108)
Creatinine, Ser: 0.94 mg/dL (ref 0.57–1.00)
GFR calc Af Amer: 64 mL/min/{1.73_m2} (ref >59)
GFR calc non Af Amer: 56 mL/min/{1.73_m2} — ABNORMAL LOW (ref >59)
Glucose: 114 mg/dL — ABNORMAL HIGH (ref 65–99)
Potassium: 3.9 mmol/L (ref 3.5–5.2)
Sodium: 142 mmol/L (ref 134–144)

## 2014-07-31 ENCOUNTER — Telehealth: Payer: Self-pay | Admitting: *Deleted

## 2014-07-31 DIAGNOSIS — M436 Torticollis: Secondary | ICD-10-CM | POA: Diagnosis not present

## 2014-07-31 DIAGNOSIS — F0391 Unspecified dementia with behavioral disturbance: Secondary | ICD-10-CM | POA: Diagnosis not present

## 2014-07-31 DIAGNOSIS — Z9181 History of falling: Secondary | ICD-10-CM | POA: Diagnosis not present

## 2014-07-31 DIAGNOSIS — M6281 Muscle weakness (generalized): Secondary | ICD-10-CM | POA: Diagnosis not present

## 2014-07-31 DIAGNOSIS — I509 Heart failure, unspecified: Secondary | ICD-10-CM | POA: Diagnosis not present

## 2014-07-31 DIAGNOSIS — G20 Parkinson's disease: Secondary | ICD-10-CM | POA: Diagnosis not present

## 2014-07-31 NOTE — Telephone Encounter (Signed)
Ok.  Looks like lasix doubled was effective even if she only got it 2x instead of 3 assuming these weights are accurate.  She should return to daily lasix.  If her weight goes up by 3 lbs in 1 day or 5 lbs in 1 week, she should double up on the lasix again until her weight returns to baseline

## 2014-07-31 NOTE — Telephone Encounter (Signed)
Daughter stated you wanted her to call you today with patient's weights. Friday- 184.5 Saturday- 171.6 Sunday- 180-did not feel well. Refused to get up and refused to get dressed. Husband got this weight several times. Daughter thinks she may have missed a dose of Lasix Monday- 167.9  Prescribed double dose of Lasix for 3 days (Fri., Sat, Sun) and potassium. Today will go back to 40mg . At 8am Please call daughter back on Cell # [phone]

## 2014-07-31 NOTE — Telephone Encounter (Signed)
Daughter Notified and faxed this order to [facility].

## 2014-08-01 ENCOUNTER — Telehealth: Payer: Self-pay | Admitting: *Deleted

## 2014-08-01 DIAGNOSIS — F0391 Unspecified dementia with behavioral disturbance: Secondary | ICD-10-CM | POA: Diagnosis not present

## 2014-08-01 DIAGNOSIS — Z9181 History of falling: Secondary | ICD-10-CM | POA: Diagnosis not present

## 2014-08-01 DIAGNOSIS — M6281 Muscle weakness (generalized): Secondary | ICD-10-CM | POA: Diagnosis not present

## 2014-08-01 DIAGNOSIS — M436 Torticollis: Secondary | ICD-10-CM | POA: Diagnosis not present

## 2014-08-01 DIAGNOSIS — G20 Parkinson's disease: Secondary | ICD-10-CM | POA: Diagnosis not present

## 2014-08-01 DIAGNOSIS — I509 Heart failure, unspecified: Secondary | ICD-10-CM | POA: Diagnosis not present

## 2014-08-01 NOTE — Telephone Encounter (Signed)
Daughter, Selena BattenKim Notified. Spoke with [employer] with [facility] Fax # [phone]. Faxed order

## 2014-08-01 NOTE — Telephone Encounter (Signed)
Patient daughter, Selena BattenKim called and stated that the patient was weighed today and gained 7 1/2 pounds since yesterday. Should they double the Lasix Again? BP was 120/76. Also noticed that her belly looks bloated. Also complaining of Dizziness and when she lays down in the bed the room spins. Please Advise.

## 2014-08-01 NOTE — Telephone Encounter (Signed)
They can double the lasix again just today, but make sure she rests in bed so she does not fall and gets extra assistance to get to the commode.  Do they have a bedside commode?  It might be good to get one if they don't.  We can do a Rx that I will sign.

## 2014-08-02 DIAGNOSIS — M436 Torticollis: Secondary | ICD-10-CM | POA: Diagnosis not present

## 2014-08-02 DIAGNOSIS — G20 Parkinson's disease: Secondary | ICD-10-CM | POA: Diagnosis not present

## 2014-08-02 DIAGNOSIS — M6281 Muscle weakness (generalized): Secondary | ICD-10-CM | POA: Diagnosis not present

## 2014-08-02 DIAGNOSIS — Z9181 History of falling: Secondary | ICD-10-CM | POA: Diagnosis not present

## 2014-08-02 DIAGNOSIS — I509 Heart failure, unspecified: Secondary | ICD-10-CM | POA: Diagnosis not present

## 2014-08-02 DIAGNOSIS — F0391 Unspecified dementia with behavioral disturbance: Secondary | ICD-10-CM | POA: Diagnosis not present

## 2014-08-03 ENCOUNTER — Telehealth: Payer: Self-pay | Admitting: *Deleted

## 2014-08-03 DIAGNOSIS — M6281 Muscle weakness (generalized): Secondary | ICD-10-CM | POA: Diagnosis not present

## 2014-08-03 DIAGNOSIS — F0391 Unspecified dementia with behavioral disturbance: Secondary | ICD-10-CM | POA: Diagnosis not present

## 2014-08-03 DIAGNOSIS — I509 Heart failure, unspecified: Secondary | ICD-10-CM | POA: Diagnosis not present

## 2014-08-03 DIAGNOSIS — Z9181 History of falling: Secondary | ICD-10-CM | POA: Diagnosis not present

## 2014-08-03 DIAGNOSIS — G20 Parkinson's disease: Secondary | ICD-10-CM | POA: Diagnosis not present

## 2014-08-03 DIAGNOSIS — M436 Torticollis: Secondary | ICD-10-CM | POA: Diagnosis not present

## 2014-08-03 NOTE — Telephone Encounter (Signed)
Patient daughter called and stated that she is worried about the patient's BP readings  08/03/2014 pre med:  120/56  After med: 102/70 pulse 66 temp. 96 (patient fell asleep while they were talking to her) and BP taken again:  92/60 and then at 11:13 it was 88/54 then they put her to bed and took it again and it was 102/60 at 11:22am Patient gets very lethargic and unable to stay awake. Having trouble rousing her from sleep. Seems consistent with morning meds. 08/03/2014 weight was 176.1 (up 7/10th from yesterday weight) legs are still swollen.  Is it possible that patient does not need the Monopril? Please Advise.

## 2014-08-03 NOTE — Telephone Encounter (Signed)
Patient daughter, Selena BattenKim Notified and called [facility] and D/C the Monopril.

## 2014-08-03 NOTE — Telephone Encounter (Signed)
Spoke with Marcelino DusterMichelle at [facility] Fax [phone] Order faxed.

## 2014-08-03 NOTE — Telephone Encounter (Signed)
Yes, stop monopril.  Continue to monitor BP.   Glad weight didn't change significantly in 24 hrs.  If we are unable to get her volume status stabilized, I may have to send her to the cardiologist.

## 2014-08-04 DIAGNOSIS — I509 Heart failure, unspecified: Secondary | ICD-10-CM | POA: Diagnosis not present

## 2014-08-04 DIAGNOSIS — F0391 Unspecified dementia with behavioral disturbance: Secondary | ICD-10-CM | POA: Diagnosis not present

## 2014-08-04 DIAGNOSIS — G20 Parkinson's disease: Secondary | ICD-10-CM | POA: Diagnosis not present

## 2014-08-04 DIAGNOSIS — Z9181 History of falling: Secondary | ICD-10-CM | POA: Diagnosis not present

## 2014-08-04 DIAGNOSIS — M436 Torticollis: Secondary | ICD-10-CM | POA: Diagnosis not present

## 2014-08-04 DIAGNOSIS — M6281 Muscle weakness (generalized): Secondary | ICD-10-CM | POA: Diagnosis not present

## 2014-08-07 ENCOUNTER — Telehealth: Payer: Self-pay | Admitting: *Deleted

## 2014-08-07 NOTE — Telephone Encounter (Signed)
Selena BattenKim, daughter called and wanted to confirm patient's Coreg dosage. Advised her that patient should be taking Coreg 6.25 One tablet twice daily. Selena BattenKim has only been giving one tablet since Sunday will correct it and start the right dosage. Patient has [facility] coming out to the home now since discharged from [facility].

## 2014-08-08 DIAGNOSIS — F0391 Unspecified dementia with behavioral disturbance: Secondary | ICD-10-CM | POA: Diagnosis not present

## 2014-08-08 DIAGNOSIS — G20 Parkinson's disease: Secondary | ICD-10-CM | POA: Diagnosis not present

## 2014-08-08 DIAGNOSIS — M6281 Muscle weakness (generalized): Secondary | ICD-10-CM | POA: Diagnosis not present

## 2014-08-08 DIAGNOSIS — Z9181 History of falling: Secondary | ICD-10-CM | POA: Diagnosis not present

## 2014-08-08 DIAGNOSIS — I509 Heart failure, unspecified: Secondary | ICD-10-CM | POA: Diagnosis not present

## 2014-08-08 DIAGNOSIS — M436 Torticollis: Secondary | ICD-10-CM | POA: Diagnosis not present

## 2014-08-09 DIAGNOSIS — Z9181 History of falling: Secondary | ICD-10-CM | POA: Diagnosis not present

## 2014-08-09 DIAGNOSIS — F0391 Unspecified dementia with behavioral disturbance: Secondary | ICD-10-CM | POA: Diagnosis not present

## 2014-08-09 DIAGNOSIS — G20 Parkinson's disease: Secondary | ICD-10-CM | POA: Diagnosis not present

## 2014-08-09 DIAGNOSIS — M436 Torticollis: Secondary | ICD-10-CM | POA: Diagnosis not present

## 2014-08-09 DIAGNOSIS — M6281 Muscle weakness (generalized): Secondary | ICD-10-CM | POA: Diagnosis not present

## 2014-08-09 DIAGNOSIS — I509 Heart failure, unspecified: Secondary | ICD-10-CM | POA: Diagnosis not present

## 2014-08-11 DIAGNOSIS — M6281 Muscle weakness (generalized): Secondary | ICD-10-CM | POA: Diagnosis not present

## 2014-08-11 DIAGNOSIS — F0391 Unspecified dementia with behavioral disturbance: Secondary | ICD-10-CM | POA: Diagnosis not present

## 2014-08-11 DIAGNOSIS — G20 Parkinson's disease: Secondary | ICD-10-CM | POA: Diagnosis not present

## 2014-08-11 DIAGNOSIS — Z9181 History of falling: Secondary | ICD-10-CM | POA: Diagnosis not present

## 2014-08-11 DIAGNOSIS — I509 Heart failure, unspecified: Secondary | ICD-10-CM | POA: Diagnosis not present

## 2014-08-11 DIAGNOSIS — M436 Torticollis: Secondary | ICD-10-CM | POA: Diagnosis not present

## 2014-08-14 DIAGNOSIS — G20 Parkinson's disease: Secondary | ICD-10-CM | POA: Diagnosis not present

## 2014-08-14 DIAGNOSIS — I509 Heart failure, unspecified: Secondary | ICD-10-CM | POA: Diagnosis not present

## 2014-08-14 DIAGNOSIS — M436 Torticollis: Secondary | ICD-10-CM | POA: Diagnosis not present

## 2014-08-14 DIAGNOSIS — M6281 Muscle weakness (generalized): Secondary | ICD-10-CM | POA: Diagnosis not present

## 2014-08-14 DIAGNOSIS — Z9181 History of falling: Secondary | ICD-10-CM | POA: Diagnosis not present

## 2014-08-14 DIAGNOSIS — F0391 Unspecified dementia with behavioral disturbance: Secondary | ICD-10-CM | POA: Diagnosis not present

## 2014-08-15 DIAGNOSIS — I509 Heart failure, unspecified: Secondary | ICD-10-CM | POA: Diagnosis not present

## 2014-08-15 DIAGNOSIS — M6281 Muscle weakness (generalized): Secondary | ICD-10-CM | POA: Diagnosis not present

## 2014-08-15 DIAGNOSIS — Z9181 History of falling: Secondary | ICD-10-CM | POA: Diagnosis not present

## 2014-08-15 DIAGNOSIS — F0391 Unspecified dementia with behavioral disturbance: Secondary | ICD-10-CM | POA: Diagnosis not present

## 2014-08-15 DIAGNOSIS — M436 Torticollis: Secondary | ICD-10-CM | POA: Diagnosis not present

## 2014-08-15 DIAGNOSIS — G20 Parkinson's disease: Secondary | ICD-10-CM | POA: Diagnosis not present

## 2014-08-16 DIAGNOSIS — F0391 Unspecified dementia with behavioral disturbance: Secondary | ICD-10-CM | POA: Diagnosis not present

## 2014-08-16 DIAGNOSIS — M436 Torticollis: Secondary | ICD-10-CM | POA: Diagnosis not present

## 2014-08-16 DIAGNOSIS — G20 Parkinson's disease: Secondary | ICD-10-CM | POA: Diagnosis not present

## 2014-08-16 DIAGNOSIS — M6281 Muscle weakness (generalized): Secondary | ICD-10-CM | POA: Diagnosis not present

## 2014-08-16 DIAGNOSIS — Z9181 History of falling: Secondary | ICD-10-CM | POA: Diagnosis not present

## 2014-08-16 DIAGNOSIS — I509 Heart failure, unspecified: Secondary | ICD-10-CM | POA: Diagnosis not present

## 2014-08-17 DIAGNOSIS — H1859 Other hereditary corneal dystrophies: Secondary | ICD-10-CM | POA: Diagnosis not present

## 2014-08-17 DIAGNOSIS — H1789 Other corneal scars and opacities: Secondary | ICD-10-CM | POA: Diagnosis not present

## 2014-08-17 DIAGNOSIS — Z961 Presence of intraocular lens: Secondary | ICD-10-CM | POA: Diagnosis not present

## 2014-08-18 DIAGNOSIS — Z9181 History of falling: Secondary | ICD-10-CM | POA: Diagnosis not present

## 2014-08-18 DIAGNOSIS — M6281 Muscle weakness (generalized): Secondary | ICD-10-CM | POA: Diagnosis not present

## 2014-08-18 DIAGNOSIS — G20 Parkinson's disease: Secondary | ICD-10-CM | POA: Diagnosis not present

## 2014-08-18 DIAGNOSIS — M436 Torticollis: Secondary | ICD-10-CM | POA: Diagnosis not present

## 2014-08-18 DIAGNOSIS — I509 Heart failure, unspecified: Secondary | ICD-10-CM | POA: Diagnosis not present

## 2014-08-18 DIAGNOSIS — F0391 Unspecified dementia with behavioral disturbance: Secondary | ICD-10-CM | POA: Diagnosis not present

## 2014-08-21 DIAGNOSIS — M436 Torticollis: Secondary | ICD-10-CM | POA: Diagnosis not present

## 2014-08-21 DIAGNOSIS — F0391 Unspecified dementia with behavioral disturbance: Secondary | ICD-10-CM | POA: Diagnosis not present

## 2014-08-21 DIAGNOSIS — I509 Heart failure, unspecified: Secondary | ICD-10-CM | POA: Diagnosis not present

## 2014-08-21 DIAGNOSIS — M6281 Muscle weakness (generalized): Secondary | ICD-10-CM | POA: Diagnosis not present

## 2014-08-21 DIAGNOSIS — Z9181 History of falling: Secondary | ICD-10-CM | POA: Diagnosis not present

## 2014-08-21 DIAGNOSIS — G20 Parkinson's disease: Secondary | ICD-10-CM | POA: Diagnosis not present

## 2014-08-22 DIAGNOSIS — F0391 Unspecified dementia with behavioral disturbance: Secondary | ICD-10-CM | POA: Diagnosis not present

## 2014-08-22 DIAGNOSIS — M6281 Muscle weakness (generalized): Secondary | ICD-10-CM | POA: Diagnosis not present

## 2014-08-22 DIAGNOSIS — Z9181 History of falling: Secondary | ICD-10-CM | POA: Diagnosis not present

## 2014-08-22 DIAGNOSIS — I509 Heart failure, unspecified: Secondary | ICD-10-CM | POA: Diagnosis not present

## 2014-08-22 DIAGNOSIS — G20 Parkinson's disease: Secondary | ICD-10-CM | POA: Diagnosis not present

## 2014-08-22 DIAGNOSIS — M436 Torticollis: Secondary | ICD-10-CM | POA: Diagnosis not present

## 2014-08-23 DIAGNOSIS — F0391 Unspecified dementia with behavioral disturbance: Secondary | ICD-10-CM | POA: Diagnosis not present

## 2014-08-23 DIAGNOSIS — I509 Heart failure, unspecified: Secondary | ICD-10-CM | POA: Diagnosis not present

## 2014-08-23 DIAGNOSIS — M6281 Muscle weakness (generalized): Secondary | ICD-10-CM | POA: Diagnosis not present

## 2014-08-23 DIAGNOSIS — Z9181 History of falling: Secondary | ICD-10-CM | POA: Diagnosis not present

## 2014-08-23 DIAGNOSIS — M436 Torticollis: Secondary | ICD-10-CM | POA: Diagnosis not present

## 2014-08-23 DIAGNOSIS — G20 Parkinson's disease: Secondary | ICD-10-CM | POA: Diagnosis not present

## 2014-08-24 ENCOUNTER — Other Ambulatory Visit: Payer: Self-pay | Admitting: *Deleted

## 2014-08-24 DIAGNOSIS — M6281 Muscle weakness (generalized): Secondary | ICD-10-CM | POA: Diagnosis not present

## 2014-08-24 DIAGNOSIS — Z9181 History of falling: Secondary | ICD-10-CM | POA: Diagnosis not present

## 2014-08-24 DIAGNOSIS — M436 Torticollis: Secondary | ICD-10-CM | POA: Diagnosis not present

## 2014-08-24 DIAGNOSIS — I509 Heart failure, unspecified: Secondary | ICD-10-CM | POA: Diagnosis not present

## 2014-08-24 DIAGNOSIS — G20 Parkinson's disease: Secondary | ICD-10-CM | POA: Diagnosis not present

## 2014-08-24 DIAGNOSIS — F0391 Unspecified dementia with behavioral disturbance: Secondary | ICD-10-CM | POA: Diagnosis not present

## 2014-08-24 MED ORDER — MIRABEGRON ER 50 MG PO TB24: ORAL_TABLET | ORAL | Status: DC

## 2014-08-24 NOTE — Telephone Encounter (Signed)
[facility] °

## 2014-08-28 DIAGNOSIS — M436 Torticollis: Secondary | ICD-10-CM | POA: Diagnosis not present

## 2014-08-28 DIAGNOSIS — Z9181 History of falling: Secondary | ICD-10-CM | POA: Diagnosis not present

## 2014-08-28 DIAGNOSIS — I509 Heart failure, unspecified: Secondary | ICD-10-CM | POA: Diagnosis not present

## 2014-08-28 DIAGNOSIS — F0391 Unspecified dementia with behavioral disturbance: Secondary | ICD-10-CM | POA: Diagnosis not present

## 2014-08-28 DIAGNOSIS — G20 Parkinson's disease: Secondary | ICD-10-CM | POA: Diagnosis not present

## 2014-08-28 DIAGNOSIS — M6281 Muscle weakness (generalized): Secondary | ICD-10-CM | POA: Diagnosis not present

## 2014-08-29 ENCOUNTER — Telehealth: Payer: Self-pay

## 2014-08-29 DIAGNOSIS — Z9181 History of falling: Secondary | ICD-10-CM | POA: Diagnosis not present

## 2014-08-29 DIAGNOSIS — F0391 Unspecified dementia with behavioral disturbance: Secondary | ICD-10-CM | POA: Diagnosis not present

## 2014-08-29 DIAGNOSIS — M6281 Muscle weakness (generalized): Secondary | ICD-10-CM | POA: Diagnosis not present

## 2014-08-29 DIAGNOSIS — I509 Heart failure, unspecified: Secondary | ICD-10-CM | POA: Diagnosis not present

## 2014-08-29 DIAGNOSIS — M436 Torticollis: Secondary | ICD-10-CM | POA: Diagnosis not present

## 2014-08-29 DIAGNOSIS — G20 Parkinson's disease: Secondary | ICD-10-CM | POA: Diagnosis not present

## 2014-08-29 NOTE — Telephone Encounter (Signed)
Message left on triage voicemail- Patient gained 3 1/2 pounds in 24 hours and with increase leg swelling. Patient's daughter questions if it is ok to give extra lasix  Please advise

## 2014-08-29 NOTE — Telephone Encounter (Signed)
Yes, give lasix daily until weight returns to baseline (down 3.5lbs).  Please wish them a Happy Thanksgiving!

## 2014-08-30 DIAGNOSIS — M6281 Muscle weakness (generalized): Secondary | ICD-10-CM | POA: Diagnosis not present

## 2014-08-30 DIAGNOSIS — G20 Parkinson's disease: Secondary | ICD-10-CM | POA: Diagnosis not present

## 2014-08-30 DIAGNOSIS — Z9181 History of falling: Secondary | ICD-10-CM | POA: Diagnosis not present

## 2014-08-30 DIAGNOSIS — M436 Torticollis: Secondary | ICD-10-CM | POA: Diagnosis not present

## 2014-08-30 DIAGNOSIS — I509 Heart failure, unspecified: Secondary | ICD-10-CM | POA: Diagnosis not present

## 2014-08-30 DIAGNOSIS — F0391 Unspecified dementia with behavioral disturbance: Secondary | ICD-10-CM | POA: Diagnosis not present

## 2014-08-30 NOTE — Telephone Encounter (Signed)
Spoke with patient's daughter to discuss Dr.Reed's response. Kim verbalized understanding of response

## 2014-09-04 DIAGNOSIS — M436 Torticollis: Secondary | ICD-10-CM | POA: Diagnosis not present

## 2014-09-04 DIAGNOSIS — G20 Parkinson's disease: Secondary | ICD-10-CM | POA: Diagnosis not present

## 2014-09-04 DIAGNOSIS — F0391 Unspecified dementia with behavioral disturbance: Secondary | ICD-10-CM | POA: Diagnosis not present

## 2014-09-04 DIAGNOSIS — Z9181 History of falling: Secondary | ICD-10-CM | POA: Diagnosis not present

## 2014-09-04 DIAGNOSIS — M6281 Muscle weakness (generalized): Secondary | ICD-10-CM | POA: Diagnosis not present

## 2014-09-04 DIAGNOSIS — I509 Heart failure, unspecified: Secondary | ICD-10-CM | POA: Diagnosis not present

## 2014-09-06 DIAGNOSIS — G20 Parkinson's disease: Secondary | ICD-10-CM | POA: Diagnosis not present

## 2014-09-06 DIAGNOSIS — I509 Heart failure, unspecified: Secondary | ICD-10-CM | POA: Diagnosis not present

## 2014-09-06 DIAGNOSIS — Z9181 History of falling: Secondary | ICD-10-CM | POA: Diagnosis not present

## 2014-09-06 DIAGNOSIS — M436 Torticollis: Secondary | ICD-10-CM | POA: Diagnosis not present

## 2014-09-06 DIAGNOSIS — M6281 Muscle weakness (generalized): Secondary | ICD-10-CM | POA: Diagnosis not present

## 2014-09-06 DIAGNOSIS — F0391 Unspecified dementia with behavioral disturbance: Secondary | ICD-10-CM | POA: Diagnosis not present

## 2014-09-08 DIAGNOSIS — M436 Torticollis: Secondary | ICD-10-CM | POA: Diagnosis not present

## 2014-09-08 DIAGNOSIS — G20 Parkinson's disease: Secondary | ICD-10-CM | POA: Diagnosis not present

## 2014-09-08 DIAGNOSIS — M6281 Muscle weakness (generalized): Secondary | ICD-10-CM | POA: Diagnosis not present

## 2014-09-08 DIAGNOSIS — Z9181 History of falling: Secondary | ICD-10-CM | POA: Diagnosis not present

## 2014-09-08 DIAGNOSIS — F0391 Unspecified dementia with behavioral disturbance: Secondary | ICD-10-CM | POA: Diagnosis not present

## 2014-09-08 DIAGNOSIS — I509 Heart failure, unspecified: Secondary | ICD-10-CM | POA: Diagnosis not present

## 2014-09-10 DIAGNOSIS — R269 Unspecified abnormalities of gait and mobility: Secondary | ICD-10-CM | POA: Diagnosis not present

## 2014-09-10 DIAGNOSIS — Z9181 History of falling: Secondary | ICD-10-CM | POA: Diagnosis not present

## 2014-09-10 DIAGNOSIS — G20 Parkinson's disease: Secondary | ICD-10-CM

## 2014-09-10 DIAGNOSIS — F0391 Unspecified dementia with behavioral disturbance: Secondary | ICD-10-CM

## 2014-09-10 DIAGNOSIS — I509 Heart failure, unspecified: Secondary | ICD-10-CM

## 2014-09-10 DIAGNOSIS — M6281 Muscle weakness (generalized): Secondary | ICD-10-CM

## 2014-09-10 DIAGNOSIS — M436 Torticollis: Secondary | ICD-10-CM | POA: Diagnosis not present

## 2014-09-12 DIAGNOSIS — M6281 Muscle weakness (generalized): Secondary | ICD-10-CM | POA: Diagnosis not present

## 2014-09-12 DIAGNOSIS — M436 Torticollis: Secondary | ICD-10-CM | POA: Diagnosis not present

## 2014-09-12 DIAGNOSIS — I509 Heart failure, unspecified: Secondary | ICD-10-CM | POA: Diagnosis not present

## 2014-09-12 DIAGNOSIS — G20 Parkinson's disease: Secondary | ICD-10-CM | POA: Diagnosis not present

## 2014-09-12 DIAGNOSIS — Z9181 History of falling: Secondary | ICD-10-CM | POA: Diagnosis not present

## 2014-09-12 DIAGNOSIS — F0391 Unspecified dementia with behavioral disturbance: Secondary | ICD-10-CM | POA: Diagnosis not present

## 2014-09-14 DIAGNOSIS — M6281 Muscle weakness (generalized): Secondary | ICD-10-CM | POA: Diagnosis not present

## 2014-09-14 DIAGNOSIS — F0391 Unspecified dementia with behavioral disturbance: Secondary | ICD-10-CM | POA: Diagnosis not present

## 2014-09-14 DIAGNOSIS — I509 Heart failure, unspecified: Secondary | ICD-10-CM | POA: Diagnosis not present

## 2014-09-14 DIAGNOSIS — M436 Torticollis: Secondary | ICD-10-CM | POA: Diagnosis not present

## 2014-09-14 DIAGNOSIS — Z9181 History of falling: Secondary | ICD-10-CM | POA: Diagnosis not present

## 2014-09-14 DIAGNOSIS — G20 Parkinson's disease: Secondary | ICD-10-CM | POA: Diagnosis not present

## 2014-09-19 DIAGNOSIS — G20 Parkinson's disease: Secondary | ICD-10-CM | POA: Diagnosis not present

## 2014-09-19 DIAGNOSIS — M436 Torticollis: Secondary | ICD-10-CM | POA: Diagnosis not present

## 2014-09-19 DIAGNOSIS — Z9181 History of falling: Secondary | ICD-10-CM | POA: Diagnosis not present

## 2014-09-19 DIAGNOSIS — I509 Heart failure, unspecified: Secondary | ICD-10-CM | POA: Diagnosis not present

## 2014-09-19 DIAGNOSIS — M6281 Muscle weakness (generalized): Secondary | ICD-10-CM | POA: Diagnosis not present

## 2014-09-19 DIAGNOSIS — F0391 Unspecified dementia with behavioral disturbance: Secondary | ICD-10-CM | POA: Diagnosis not present

## 2014-09-21 DIAGNOSIS — M6281 Muscle weakness (generalized): Secondary | ICD-10-CM | POA: Diagnosis not present

## 2014-09-21 DIAGNOSIS — Z9181 History of falling: Secondary | ICD-10-CM | POA: Diagnosis not present

## 2014-09-21 DIAGNOSIS — G20 Parkinson's disease: Secondary | ICD-10-CM | POA: Diagnosis not present

## 2014-09-21 DIAGNOSIS — I509 Heart failure, unspecified: Secondary | ICD-10-CM | POA: Diagnosis not present

## 2014-09-21 DIAGNOSIS — F0391 Unspecified dementia with behavioral disturbance: Secondary | ICD-10-CM | POA: Diagnosis not present

## 2014-09-21 DIAGNOSIS — M436 Torticollis: Secondary | ICD-10-CM | POA: Diagnosis not present

## 2014-09-25 ENCOUNTER — Other Ambulatory Visit: Payer: Self-pay | Admitting: *Deleted

## 2014-09-25 DIAGNOSIS — G3183 Dementia with Lewy bodies: Secondary | ICD-10-CM

## 2014-09-25 MED ORDER — CLOPIDOGREL BISULFATE 75 MG PO TABS: ORAL_TABLET | ORAL | Status: DC

## 2014-09-25 MED ORDER — ROSUVASTATIN CALCIUM 10 MG PO TABS: 10.0000 mg | ORAL_TABLET | Freq: Every day | ORAL | Status: DC

## 2014-09-25 MED ORDER — CARVEDILOL 6.25 MG PO TABS: 6.2500 mg | ORAL_TABLET | Freq: Two times a day (BID) | ORAL | Status: DC

## 2014-09-25 NOTE — Telephone Encounter (Signed)
[facility] °

## 2014-09-26 DIAGNOSIS — F0391 Unspecified dementia with behavioral disturbance: Secondary | ICD-10-CM | POA: Diagnosis not present

## 2014-09-26 DIAGNOSIS — I509 Heart failure, unspecified: Secondary | ICD-10-CM | POA: Diagnosis not present

## 2014-09-26 DIAGNOSIS — M436 Torticollis: Secondary | ICD-10-CM | POA: Diagnosis not present

## 2014-09-26 DIAGNOSIS — M6281 Muscle weakness (generalized): Secondary | ICD-10-CM | POA: Diagnosis not present

## 2014-09-26 DIAGNOSIS — G20 Parkinson's disease: Secondary | ICD-10-CM | POA: Diagnosis not present

## 2014-09-26 DIAGNOSIS — Z9181 History of falling: Secondary | ICD-10-CM | POA: Diagnosis not present

## 2014-09-28 DIAGNOSIS — Z9181 History of falling: Secondary | ICD-10-CM | POA: Diagnosis not present

## 2014-09-28 DIAGNOSIS — G20 Parkinson's disease: Secondary | ICD-10-CM | POA: Diagnosis not present

## 2014-09-28 DIAGNOSIS — I509 Heart failure, unspecified: Secondary | ICD-10-CM | POA: Diagnosis not present

## 2014-09-28 DIAGNOSIS — M6281 Muscle weakness (generalized): Secondary | ICD-10-CM | POA: Diagnosis not present

## 2014-09-28 DIAGNOSIS — F0391 Unspecified dementia with behavioral disturbance: Secondary | ICD-10-CM | POA: Diagnosis not present

## 2014-09-28 DIAGNOSIS — M436 Torticollis: Secondary | ICD-10-CM | POA: Diagnosis not present

## 2014-10-03 ENCOUNTER — Other Ambulatory Visit: Payer: Self-pay | Admitting: *Deleted

## 2014-10-03 ENCOUNTER — Telehealth: Payer: Self-pay | Admitting: *Deleted

## 2014-10-03 MED ORDER — FUROSEMIDE 20 MG PO TABS
ORAL_TABLET | ORAL | Status: DC
Start: 2014-10-03 — End: 2014-10-03

## 2014-10-03 MED ORDER — RIVASTIGMINE 4.6 MG/24HR TD PT24: 4.6000 mg | MEDICATED_PATCH | Freq: Every day | TRANSDERMAL | Status: DC

## 2014-10-03 MED ORDER — FUROSEMIDE 20 MG PO TABS: ORAL_TABLET | ORAL | Status: DC

## 2014-10-03 MED ORDER — CARVEDILOL 6.25 MG PO TABS: 6.2500 mg | ORAL_TABLET | Freq: Two times a day (BID) | ORAL | Status: DC

## 2014-10-03 NOTE — Telephone Encounter (Signed)
Patient daughter Selena BattenKim called and stated that she needs a Rx called in for patient for BRAND Exelon patch because the generic patch will not stick to her mothers skin and it keeps falling off. They have pets in the house and she is scared that the pets are going to find the fallen off patch and eat it. Also wants to know the dosing of patient's Lasix, Dosing gave to her from dosing in chart and called to pharmacy. Faxed in Exelon Brand patch to pharmacy.

## 2014-10-04 DIAGNOSIS — M436 Torticollis: Secondary | ICD-10-CM | POA: Diagnosis not present

## 2014-10-04 DIAGNOSIS — G20 Parkinson's disease: Secondary | ICD-10-CM | POA: Diagnosis not present

## 2014-10-04 DIAGNOSIS — Z9181 History of falling: Secondary | ICD-10-CM | POA: Diagnosis not present

## 2014-10-04 DIAGNOSIS — F0391 Unspecified dementia with behavioral disturbance: Secondary | ICD-10-CM | POA: Diagnosis not present

## 2014-10-04 DIAGNOSIS — I509 Heart failure, unspecified: Secondary | ICD-10-CM | POA: Diagnosis not present

## 2014-10-04 DIAGNOSIS — M6281 Muscle weakness (generalized): Secondary | ICD-10-CM | POA: Diagnosis not present

## 2014-10-11 ENCOUNTER — Telehealth: Payer: Self-pay | Admitting: *Deleted

## 2014-10-11 NOTE — Telephone Encounter (Signed)
I Called [facility] and spoke with [place] Terrace[place]eth and gave verbal orders for Nursing. Patient daughter Notified and agreed.

## 2014-10-11 NOTE — Telephone Encounter (Signed)
Daughter, Holly Grimes called and stated that patient had another Mania episode and another fall out of bed on Monday and has had Insomnia. Daughter gave her 1/4 of a Haldol and patient has been sleeping since 2:00 on Tuesday. Daughter stated that something is just different about her this time, she's just not feeling well. Daughter wants [facility] to come back into home to evaluate her. Is this ok to order Nursing. Please Advise.

## 2014-10-11 NOTE — Telephone Encounter (Signed)
Yes, [facility] to evaluate is fine with me.  Dxs:  lewy body dementia, fatigue and weakness

## 2014-10-12 DIAGNOSIS — Z9181 History of falling: Secondary | ICD-10-CM | POA: Diagnosis not present

## 2014-10-12 DIAGNOSIS — M6281 Muscle weakness (generalized): Secondary | ICD-10-CM | POA: Diagnosis not present

## 2014-10-12 DIAGNOSIS — I509 Heart failure, unspecified: Secondary | ICD-10-CM | POA: Diagnosis not present

## 2014-10-12 DIAGNOSIS — M436 Torticollis: Secondary | ICD-10-CM | POA: Diagnosis not present

## 2014-10-12 DIAGNOSIS — F0391 Unspecified dementia with behavioral disturbance: Secondary | ICD-10-CM | POA: Diagnosis not present

## 2014-10-12 DIAGNOSIS — G20 Parkinson's disease: Secondary | ICD-10-CM | POA: Diagnosis not present

## 2014-10-13 DIAGNOSIS — G20 Parkinson's disease: Secondary | ICD-10-CM | POA: Diagnosis not present

## 2014-10-13 DIAGNOSIS — Z9181 History of falling: Secondary | ICD-10-CM | POA: Diagnosis not present

## 2014-10-13 DIAGNOSIS — F0391 Unspecified dementia with behavioral disturbance: Secondary | ICD-10-CM | POA: Diagnosis not present

## 2014-10-13 DIAGNOSIS — M436 Torticollis: Secondary | ICD-10-CM | POA: Diagnosis not present

## 2014-10-13 DIAGNOSIS — M6281 Muscle weakness (generalized): Secondary | ICD-10-CM | POA: Diagnosis not present

## 2014-10-13 DIAGNOSIS — I509 Heart failure, unspecified: Secondary | ICD-10-CM | POA: Diagnosis not present

## 2014-10-17 DIAGNOSIS — F0391 Unspecified dementia with behavioral disturbance: Secondary | ICD-10-CM | POA: Diagnosis not present

## 2014-10-17 DIAGNOSIS — M436 Torticollis: Secondary | ICD-10-CM | POA: Diagnosis not present

## 2014-10-17 DIAGNOSIS — Z9181 History of falling: Secondary | ICD-10-CM | POA: Diagnosis not present

## 2014-10-17 DIAGNOSIS — M6281 Muscle weakness (generalized): Secondary | ICD-10-CM | POA: Diagnosis not present

## 2014-10-17 DIAGNOSIS — G20 Parkinson's disease: Secondary | ICD-10-CM | POA: Diagnosis not present

## 2014-10-17 DIAGNOSIS — I509 Heart failure, unspecified: Secondary | ICD-10-CM | POA: Diagnosis not present

## 2014-10-19 DIAGNOSIS — G20 Parkinson's disease: Secondary | ICD-10-CM | POA: Diagnosis not present

## 2014-10-19 DIAGNOSIS — M6281 Muscle weakness (generalized): Secondary | ICD-10-CM | POA: Diagnosis not present

## 2014-10-19 DIAGNOSIS — Z9181 History of falling: Secondary | ICD-10-CM | POA: Diagnosis not present

## 2014-10-19 DIAGNOSIS — F0391 Unspecified dementia with behavioral disturbance: Secondary | ICD-10-CM | POA: Diagnosis not present

## 2014-10-19 DIAGNOSIS — M436 Torticollis: Secondary | ICD-10-CM | POA: Diagnosis not present

## 2014-10-19 DIAGNOSIS — I509 Heart failure, unspecified: Secondary | ICD-10-CM | POA: Diagnosis not present

## 2014-10-23 DIAGNOSIS — F0391 Unspecified dementia with behavioral disturbance: Secondary | ICD-10-CM | POA: Diagnosis not present

## 2014-10-23 DIAGNOSIS — I509 Heart failure, unspecified: Secondary | ICD-10-CM | POA: Diagnosis not present

## 2014-10-23 DIAGNOSIS — Z9181 History of falling: Secondary | ICD-10-CM | POA: Diagnosis not present

## 2014-10-23 DIAGNOSIS — M6281 Muscle weakness (generalized): Secondary | ICD-10-CM | POA: Diagnosis not present

## 2014-10-23 DIAGNOSIS — M436 Torticollis: Secondary | ICD-10-CM | POA: Diagnosis not present

## 2014-10-23 DIAGNOSIS — G20 Parkinson's disease: Secondary | ICD-10-CM | POA: Diagnosis not present

## 2014-10-26 DIAGNOSIS — Z9181 History of falling: Secondary | ICD-10-CM | POA: Diagnosis not present

## 2014-10-26 DIAGNOSIS — G20 Parkinson's disease: Secondary | ICD-10-CM | POA: Diagnosis not present

## 2014-10-26 DIAGNOSIS — F0391 Unspecified dementia with behavioral disturbance: Secondary | ICD-10-CM | POA: Diagnosis not present

## 2014-10-26 DIAGNOSIS — M6281 Muscle weakness (generalized): Secondary | ICD-10-CM | POA: Diagnosis not present

## 2014-10-26 DIAGNOSIS — M436 Torticollis: Secondary | ICD-10-CM | POA: Diagnosis not present

## 2014-10-26 DIAGNOSIS — I509 Heart failure, unspecified: Secondary | ICD-10-CM | POA: Diagnosis not present

## 2014-11-02 ENCOUNTER — Ambulatory Visit (INDEPENDENT_AMBULATORY_CARE_PROVIDER_SITE_OTHER): Payer: Medicare Other | Admitting: Internal Medicine

## 2014-11-02 ENCOUNTER — Encounter: Payer: Self-pay | Admitting: Internal Medicine

## 2014-11-02 VITALS — BP 118/80 | HR 68 | Temp 98.5°F | Resp 10 | Ht 62.0 in | Wt 180.0 lb

## 2014-11-02 DIAGNOSIS — B372 Candidiasis of skin and nail: Secondary | ICD-10-CM

## 2014-11-02 DIAGNOSIS — Z9181 History of falling: Secondary | ICD-10-CM | POA: Diagnosis not present

## 2014-11-02 DIAGNOSIS — I5032 Chronic diastolic (congestive) heart failure: Secondary | ICD-10-CM

## 2014-11-02 DIAGNOSIS — Z23 Encounter for immunization: Secondary | ICD-10-CM | POA: Diagnosis not present

## 2014-11-02 DIAGNOSIS — F0280 Dementia in other diseases classified elsewhere without behavioral disturbance: Secondary | ICD-10-CM

## 2014-11-02 DIAGNOSIS — F3110 Bipolar disorder, current episode manic without psychotic features, unspecified: Secondary | ICD-10-CM | POA: Diagnosis not present

## 2014-11-02 DIAGNOSIS — N3941 Urge incontinence: Secondary | ICD-10-CM | POA: Diagnosis not present

## 2014-11-02 DIAGNOSIS — G20 Parkinson's disease: Secondary | ICD-10-CM | POA: Diagnosis not present

## 2014-11-02 DIAGNOSIS — K5901 Slow transit constipation: Secondary | ICD-10-CM | POA: Diagnosis not present

## 2014-11-02 DIAGNOSIS — G3183 Dementia with Lewy bodies: Secondary | ICD-10-CM

## 2014-11-02 MED ORDER — CITALOPRAM HYDROBROMIDE 20 MG PO TABS: 20.0000 mg | ORAL_TABLET | Freq: Every day | ORAL | Status: DC

## 2014-11-02 MED ORDER — NYSTATIN 100000 UNIT/GM EX POWD: CUTANEOUS | Status: DC

## 2014-11-02 NOTE — Progress Notes (Signed)
Patient ID: Holly Grimes, female   DOB: December 05, 1926, 81 y.o.   MRN: [id number]   Location:  [facility]  Code Status: DNR  Allergies  Allergen Reactions  . Cephalexin Nausea And Vomiting  . Sulfa Antibiotics Other (See Comments)    Daughter unsure of reaction, but believes it is severe  . Sulfites     unknown    Chief Complaint  Patient presents with  . Medical Management of Chronic Issues    3 month follow-up, frequent falls (4 within last month), no recent labs (last meal 10:30-11:00 am)   . Immunizations    Discuss need for pneumonia vaccine 13     HPI: Patient is a 81 y.o. white female seen in the office today for med mgt chronic diseases.    She has fallen 4x since last visit.  First two were associated with too much lasix (med error when she left the ALF).  Other two were when son in law took her to the bathroom and didn't wait until she sat down on the toilet and she fell forward into the tub area.  She had a bruise on her right knee.  Does not sleep well  Incontinence improved a little with myrbetriq.  Has declined since her AL respite visit and her daughter thinks she was happier among others her age.  She is considering another respite stay.    Had c/o left hip and thigh that resolved.    Review of Systems:  Review of Systems  Constitutional: Negative for fever.  HENT: Negative for congestion.   Respiratory: Negative for shortness of breath.   Cardiovascular: Negative for chest pain.  Gastrointestinal: Positive for constipation. Negative for abdominal pain, blood in stool and melena.  Genitourinary: Positive for urgency. Negative for dysuria and frequency.  Musculoskeletal: Positive for joint pain and falls.  Skin: Negative for rash.  Neurological: Positive for dizziness. Negative for loss of consciousness.  Endo/Heme/Allergies: Bruises/bleeds easily.  Psychiatric/Behavioral: Positive for depression and memory loss. The  patient has insomnia.      Past Medical History  Diagnosis Date  . Hypertension   . High cholesterol   . Mitral valve prolapse   . Dementia   . Parkinson's disease, Lewy body   . Unspecified constipation   . Onychia and paronychia of toe   . Spasm of muscle   . Abnormality of gait   . Orthostatic hypotension   . Major depressive disorder, single episode, unspecified   . Diaphragmatic hernia without mention of obstruction or gangrene   . Alzheimer's disease   . Osteoarthrosis, unspecified whether generalized or localized, unspecified site   . Osteoporosis, unspecified   . Unspecified constipation   . Sprain of ribs   . Delirium due to conditions classified elsewhere   . Diarrhea   . Edema   . Pain in joint, pelvic region and thigh   . Other malaise and fatigue   . Urinary frequency [id number]  . Personal history of fall   . Female stress incontinence   . Other and unspecified hyperlipidemia   . Obesity, unspecified   . Osteoarthrosis, unspecified whether generalized or localized, unspecified site   . Pain in joint, shoulder region   . Transient ischemic attack (TIA), and cerebral infarction without residual deficits(V12.54)     Past Surgical History  Procedure Laterality Date  . Joint replacement    . Tonsillectomy    . Ankle surgery Left 1998  . Total knee  arthroplasty Right 2002  . Total knee arthroplasty Left 2005  . Hip arthroplasty Right 09/24/2013    Procedure: ARTHROPLASTY BIPOLAR HIP;  Surgeon: Loanne Drilling, MD;  Location: [facility];  Service: Orthopedics;  Laterality: Right;    Social History:   reports that she has never smoked. She does not have any smokeless tobacco history on file. She reports that she does not drink alcohol or use illicit drugs.  Family History  Problem Relation Age of Onset  . Adopted: Yes    Medications: Patient's Medications  New Prescriptions   No medications on file  Previous Medications   ACETAMINOPHEN (TYLENOL) 650 MG CR  TABLET    Take 650 mg by mouth. Take 650 mg by mouth every 8 hours formulation   ALBUTEROL (ACCUNEB) 1.25 MG/3ML NEBULIZER SOLUTION    Take 3 mLs (1.25 mg total) by nebulization every 6 (six) hours as needed for wheezing.   ASCORBIC ACID (VITAMIN C WITH ROSE HIPS) 1000 MG TABLET    Take 1,000 mg by mouth daily.     CALCIUM CITRATE (CITRACAL PO)    Take by mouth 2 (two) times daily.   CARVEDILOL (COREG) 6.25 MG TABLET    Take 1 tablet (6.25 mg total) by mouth 2 (two) times daily with a meal.   CHOLECALCIFEROL (VITAMIN D3) 2000 UNITS TABS    Take 2,000 Units by mouth daily.    CITALOPRAM (CELEXA) 40 MG TABLET    Take 20 mg by mouth. 1/2 by mouth daily   CLOPIDOGREL (PLAVIX) 75 MG TABLET    Take one tablet by mouth once daily to help circulation   DOCUSATE SODIUM (COLACE) 100 MG CAPSULE    Take 100 mg by mouth at bedtime.    FEEDING SUPPLEMENT (BOOST HIGH PROTEIN) LIQD    Take 1 Container by mouth as needed.    FLUTICASONE (CUTIVATE) 0.05 % CREAM    Apply 1 application topically 2 (two) times daily. To affected areas as needed for skin irritaion   FUROSEMIDE (LASIX) 20 MG TABLET    Take one tablet by mouth once daily unless weight gain of 3 pounds in one day or 5 pounds in one week  take two tablets by mouth as needed for swelling   GUAIFENESIN (MUCINEX MAXIMUM STRENGTH) 1200 MG TB12    Take by mouth daily.    HALOPERIDOL (HALDOL) 0.5 MG TABLET    Take one tablet by mouth once daily as needed for agitation/psychosis   MELATONIN 1 MG TABS    Take by mouth. 1/4 MG Daily as needed   MEMANTINE HCL ER (NAMENDA XR) 28 MG CP24    Take 28 mg by mouth at bedtime. Take one tablet once a day to preserve memory   MIRABEGRON ER (MYRBETRIQ) 50 MG TB24 TABLET    Take one tablet by mouth once daily for bladder   MULTIPLE VITAMINS-MINERALS (CENTRUM SILVER ULTRA WOMENS PO)    Take 1 tablet by mouth every morning.     NYSTATIN (MYCOSTATIN/NYSTOP) 100000 UNIT/GM POWD    2 times a day for 2 weeks in LT auxillia   OMEGA-3  ACID ETHYL ESTERS (LOVAZA) 1 G CAPSULE    Take 2 capsules (2 g total) by mouth 2 (two) times daily.   OMEPRAZOLE (PRILOSEC) 20 MG CAPSULE    Take one capsule by mouth every day to reduce stomach acid secretion   PROBIOTIC PRODUCT (ALIGN) 4 MG CAPS    Take 1 capsule (4 mg total) by mouth daily.  RIVASTIGMINE (EXELON) 4.6 MG/24HR    Place 1 patch (4.6 mg total) onto the skin daily.   ROSUVASTATIN (CRESTOR) 10 MG TABLET    Take 1 tablet (10 mg total) by mouth at bedtime.   WHEAT DEXTRIN (BENEFIBER) POWD    Take by mouth. Use one capful with liquid daily for constipation  Modified Medications   No medications on file  Discontinued Medications   CALCIUM CARBONATE (OS-CAL) 600 MG TABS TABLET    Take 600 mg by mouth 2 (two) times daily with a meal.   FOLIC ACID (FOLVITE) 400 MCG TABLET    Take 400 mcg by mouth daily.   FOSINOPRIL (MONOPRIL) 10 MG TABLET    Take 1 tablet (10 mg total) by mouth daily.   POLYETHYLENE GLYCOL (MIRALAX / GLYCOLAX) PACKET    Take 17 g by mouth daily.   POTASSIUM CHLORIDE SA (K-DUR,KLOR-CON) 20 MEQ TABLET    Take 1 tablet (20 mEq total) by mouth daily.     Physical Exam: Filed Vitals:   11/02/14 1455  BP: 118/80  Pulse: 68  Temp: 98.5 F (36.9 C)  TempSrc: Oral  Resp: 10  Height: 5\' 2"  (1.575 m)  Weight: 180 lb (81.647 kg)  SpO2: 97%  Physical Exam  Constitutional: She appears well-developed and well-nourished. No distress.  Eyes:  glasses  Cardiovascular: Normal rate, regular rhythm, normal heart sounds and intact distal pulses.   Pulmonary/Chest: Effort normal and breath sounds normal. No respiratory distress.  Abdominal: Soft. Bowel sounds are normal. She exhibits no distension and no mass. There is no tenderness.  Musculoskeletal: Normal range of motion.  Came in transport chair  Neurological: She is alert.  Skin: Skin is warm and dry.  Small bruise yellow in color remains on left knee and left temple area  Psychiatric: She has a normal mood and  affect.  Smiles and pleasant here    Labs reviewed: Basic Metabolic Panel:  Recent Labs  93/81/108/04/19 0443 06/13/14 0400 07/27/14 1559  NA 141 143 142  K 3.5* 3.4* 3.9  CL 96 98 99  CO2 28 33* 28  GLUCOSE 128* 110* 114*  BUN 21 25* 16  CREATININE 0.73 0.82 0.94  CALCIUM 9.3 9.2 9.2   Liver Function Tests:  Recent Labs  06/08/14 1642  AST 28  ALT 12  ALKPHOS 52  BILITOT 1.1  PROT 7.5  ALBUMIN 3.2*   No results for input(s): LIPASE, AMYLASE in the last 8760 hours. No results for input(s): AMMONIA in the last 8760 hours. CBC:  Recent Labs  11/30/13 1544 01/19/14 1611 06/08/14 1642 06/09/14 0530  WBC 6.5 5.0 9.5 9.0  NEUTROABS 3.3 2.7 7.5  --   HGB 11.9 12.7 13.8 12.6  HCT 36.3 38.9 40.9 38.3  MCV 94 95 98.3 98.7  PLT 320 209 177 150   Assessment/Plan 1. Parkinson's disease, Lewy body -cont exelon and namenda XR  2. Chronic diastolic heart failure -cont lasix 20mg  daily  3. Bipolar I disorder, most recent episode (or current) manic -cont haldol prn  4. Urge incontinence -cont myrbetriq and help with toileting  5. Slow transit constipation -cont current regimen  6. History of fall - has had 4 falls since last visit -her daughter is considering moving her to an AL or memory care at this point  7. Need for vaccination with 13-polyvalent pneumococcal conjugate vaccine -prevnar given   Labs/tests ordered:  No new Next appt:  3 mos  Baylynn Shifflett L. Farzad Tibbetts, D.O. [facility]  [address] [place], [place] [address] Cell Phone (Mon-Fri 8am-5pm):  [phone] On Call:  [phone] & follow prompts after 5pm & weekends Office Phone:  [phone] Office Fax:  [phone]

## 2014-11-03 DIAGNOSIS — M6281 Muscle weakness (generalized): Secondary | ICD-10-CM | POA: Diagnosis not present

## 2014-11-03 DIAGNOSIS — M436 Torticollis: Secondary | ICD-10-CM | POA: Diagnosis not present

## 2014-11-03 DIAGNOSIS — G20 Parkinson's disease: Secondary | ICD-10-CM | POA: Diagnosis not present

## 2014-11-03 DIAGNOSIS — I509 Heart failure, unspecified: Secondary | ICD-10-CM | POA: Diagnosis not present

## 2014-11-03 DIAGNOSIS — F0391 Unspecified dementia with behavioral disturbance: Secondary | ICD-10-CM | POA: Diagnosis not present

## 2014-11-03 DIAGNOSIS — Z9181 History of falling: Secondary | ICD-10-CM | POA: Diagnosis not present

## 2014-11-07 DIAGNOSIS — I509 Heart failure, unspecified: Secondary | ICD-10-CM | POA: Diagnosis not present

## 2014-11-07 DIAGNOSIS — M6281 Muscle weakness (generalized): Secondary | ICD-10-CM | POA: Diagnosis not present

## 2014-11-07 DIAGNOSIS — Z9181 History of falling: Secondary | ICD-10-CM | POA: Diagnosis not present

## 2014-11-07 DIAGNOSIS — M436 Torticollis: Secondary | ICD-10-CM | POA: Diagnosis not present

## 2014-11-07 DIAGNOSIS — G20 Parkinson's disease: Secondary | ICD-10-CM | POA: Diagnosis not present

## 2014-11-07 DIAGNOSIS — F0391 Unspecified dementia with behavioral disturbance: Secondary | ICD-10-CM | POA: Diagnosis not present

## 2014-11-09 DIAGNOSIS — F319 Bipolar disorder, unspecified: Secondary | ICD-10-CM | POA: Diagnosis not present

## 2014-11-09 DIAGNOSIS — F0391 Unspecified dementia with behavioral disturbance: Secondary | ICD-10-CM | POA: Diagnosis not present

## 2014-11-09 DIAGNOSIS — I951 Orthostatic hypotension: Secondary | ICD-10-CM | POA: Diagnosis not present

## 2014-11-09 DIAGNOSIS — Z9181 History of falling: Secondary | ICD-10-CM | POA: Diagnosis not present

## 2014-11-09 DIAGNOSIS — M810 Age-related osteoporosis without current pathological fracture: Secondary | ICD-10-CM | POA: Diagnosis not present

## 2014-11-09 DIAGNOSIS — I509 Heart failure, unspecified: Secondary | ICD-10-CM | POA: Diagnosis not present

## 2014-11-09 DIAGNOSIS — M6281 Muscle weakness (generalized): Secondary | ICD-10-CM | POA: Diagnosis not present

## 2014-11-09 DIAGNOSIS — Z7902 Long term (current) use of antithrombotics/antiplatelets: Secondary | ICD-10-CM | POA: Diagnosis not present

## 2014-11-09 DIAGNOSIS — G20 Parkinson's disease: Secondary | ICD-10-CM | POA: Diagnosis not present

## 2014-11-29 ENCOUNTER — Other Ambulatory Visit: Payer: Self-pay | Admitting: Internal Medicine

## 2014-12-27 ENCOUNTER — Ambulatory Visit (INDEPENDENT_AMBULATORY_CARE_PROVIDER_SITE_OTHER): Payer: Medicare Other | Admitting: Internal Medicine

## 2014-12-27 ENCOUNTER — Encounter: Payer: Self-pay | Admitting: Internal Medicine

## 2014-12-27 VITALS — BP 116/68 | HR 67 | Temp 98.0°F | Resp 20 | Ht 61.0 in | Wt 181.0 lb

## 2014-12-27 DIAGNOSIS — F32A Depression, unspecified: Secondary | ICD-10-CM

## 2014-12-27 DIAGNOSIS — F329 Major depressive disorder, single episode, unspecified: Secondary | ICD-10-CM | POA: Diagnosis not present

## 2014-12-27 DIAGNOSIS — E785 Hyperlipidemia, unspecified: Secondary | ICD-10-CM

## 2014-12-27 DIAGNOSIS — Z8673 Personal history of transient ischemic attack (TIA), and cerebral infarction without residual deficits: Secondary | ICD-10-CM | POA: Diagnosis not present

## 2014-12-27 DIAGNOSIS — K219 Gastro-esophageal reflux disease without esophagitis: Secondary | ICD-10-CM | POA: Diagnosis not present

## 2014-12-27 DIAGNOSIS — F0391 Unspecified dementia with behavioral disturbance: Secondary | ICD-10-CM | POA: Diagnosis not present

## 2014-12-27 DIAGNOSIS — I10 Essential (primary) hypertension: Secondary | ICD-10-CM | POA: Diagnosis not present

## 2014-12-27 DIAGNOSIS — I5032 Chronic diastolic (congestive) heart failure: Secondary | ICD-10-CM

## 2014-12-27 DIAGNOSIS — Z9181 History of falling: Secondary | ICD-10-CM | POA: Diagnosis not present

## 2014-12-27 DIAGNOSIS — M2550 Pain in unspecified joint: Secondary | ICD-10-CM

## 2014-12-27 NOTE — Progress Notes (Signed)
Patient ID: Holly Grimes, female   DOB: [birth year], 81 y.o.   MRN: [id number]    Facility  [facility]    Place of Service:   OFFICE   Allergies  Allergen Reactions  . Cephalexin Nausea And Vomiting  . Sulfa Antibiotics Other (See Comments)    Daughter unsure of reaction, but believes it is severe  . Sulfites     unknown    Chief Complaint  Patient presents with  . Follow-up    FL2 form completion    HPI:  81 yo female seen today for form completion. She will be transferring to ALF ([facility]) from home. Daughter has brought several forms to be completed. Pt has dementia and is a poor historian. Hx obtained from chart and daughter. She will require special care unit as she has sundowning and "wants to go home". She takes haldol and takes a 1/4th of 0.5mg  tab for agitation. She takes melatonin to help sleep but it causes heavy sedation with 1mg  SL. Pt has had several falls in the last few mos.  Parkinson's tremor is stable. She takes namenda XR and exelon patch (brand necessary) for dementia. She has behavioral disturbances. She does wander when she is sundowning.  Depression is unchanged and she takes celexa.  She has a hx stroke and takes plavix and crestor.  Tylenol helps arthritis pain.   OAB controlled on myrbetriq.  Daughter also wants 3 additional meds on FL2 form including miralax     Past Medical History  Diagnosis Date  . Hypertension   . High cholesterol   . Mitral valve prolapse   . Dementia   . Parkinson's disease, Lewy body   . Unspecified constipation   . Onychia and paronychia of toe   . Spasm of muscle   . Abnormality of gait   . Orthostatic hypotension   . Major depressive disorder, single episode, unspecified   . Diaphragmatic hernia without mention of obstruction or gangrene   . Alzheimer's disease   . Osteoarthrosis, unspecified whether generalized or localized, unspecified site   . Osteoporosis, unspecified   . Unspecified  constipation   . Sprain of ribs   . Delirium due to conditions classified elsewhere   . Diarrhea   . Edema   . Pain in joint, pelvic region and thigh   . Other malaise and fatigue   . Urinary frequency [id number]  . Personal history of fall   . Female stress incontinence   . Other and unspecified hyperlipidemia   . Obesity, unspecified   . Osteoarthrosis, unspecified whether generalized or localized, unspecified site   . Pain in joint, shoulder region   . Transient ischemic attack (TIA), and cerebral infarction without residual deficits(V12.54)    Past Surgical History  Procedure Laterality Date  . Joint replacement    . Tonsillectomy    . Ankle surgery Left 1998  . Total knee arthroplasty Right 2002  . Total knee arthroplasty Left 2005  . Hip arthroplasty Right 09/24/2013    Procedure: ARTHROPLASTY BIPOLAR HIP;  Surgeon: Loanne Drilling, MD;  Location: [facility];  Service: Orthopedics;  Laterality: Right;   History   Social History  . Marital Status: Legally Separated    Spouse Name: N/A  . Number of Children: N/A  . Years of Education: N/A   Social History Main Topics  . Smoking status: Never Smoker   . Smokeless tobacco: Not on file  . Alcohol Use: No  . Drug Use: No  .  Sexual Activity: Not on file   Other Topics Concern  . None   Social History Narrative     Medications: Patient's Medications  New Prescriptions   No medications on file  Previous Medications   ACETAMINOPHEN (TYLENOL) 650 MG CR TABLET    Take 650 mg by mouth. Take 650 mg by mouth every 8 hours formulation   ALBUTEROL (ACCUNEB) 1.25 MG/3ML NEBULIZER SOLUTION    Take 3 mLs (1.25 mg total) by nebulization every 6 (six) hours as needed for wheezing.   ASCORBIC ACID (VITAMIN C WITH ROSE HIPS) 1000 MG TABLET    Take 1,000 mg by mouth daily.     CALCIUM CITRATE (CITRACAL PO)    Take by mouth 2 (two) times daily.   CARVEDILOL (COREG) 6.25 MG TABLET    Take 1 tablet (6.25 mg total) by mouth 2 (two) times  daily with a meal.   CHOLECALCIFEROL (VITAMIN D3) 2000 UNITS TABS    Take 2,000 Units by mouth daily.    CITALOPRAM (CELEXA) 20 MG TABLET    Take 1 tablet (20 mg total) by mouth daily.   CLOPIDOGREL (PLAVIX) 75 MG TABLET    Take one tablet by mouth once daily to help circulation   DOCUSATE SODIUM (COLACE) 100 MG CAPSULE    Take 100 mg by mouth at bedtime.    FEEDING SUPPLEMENT (BOOST HIGH PROTEIN) LIQD    Take 1 Container by mouth as needed.    FLUTICASONE (CUTIVATE) 0.05 % CREAM    Apply 1 application topically 2 (two) times daily. To affected areas as needed for skin irritaion   FUROSEMIDE (LASIX) 20 MG TABLET    Take one tablet by mouth once daily unless weight gain of 3 pounds in one day or 5 pounds in one week  take two tablets by mouth as needed for swelling   GUAIFENESIN (MUCINEX MAXIMUM STRENGTH) 1200 MG TB12    Take by mouth daily.    HALOPERIDOL (HALDOL) 0.5 MG TABLET    Take one tablet by mouth once daily as needed for agitation/psychosis   MELATONIN 1 MG TABS    Take by mouth. 1/4 MG Daily as needed   MEMANTINE HCL ER (NAMENDA XR) 28 MG CP24    Take 28 mg by mouth at bedtime. Take one tablet once a day to preserve memory   MIRABEGRON ER (MYRBETRIQ) 50 MG TB24 TABLET    Take one tablet by mouth once daily for bladder   MULTIPLE VITAMINS-MINERALS (CENTRUM SILVER ULTRA WOMENS PO)    Take 1 tablet by mouth every morning.     NYSTATIN (MYCOSTATIN/NYSTOP) 100000 UNIT/GM POWD    2 times a day for 2 weeks in LT auxillia   OMEGA-3 ACID ETHYL ESTERS (LOVAZA) 1 G CAPSULE    TAKE (2) CAPSULES BY MOUTH TWICE DAILY.   OMEPRAZOLE (PRILOSEC) 20 MG CAPSULE    Take one capsule by mouth every day to reduce stomach acid secretion   PROBIOTIC PRODUCT (ALIGN) 4 MG CAPS    Take 1 capsule (4 mg total) by mouth daily.   RIVASTIGMINE (EXELON) 4.6 MG/24HR    Place 1 patch (4.6 mg total) onto the skin daily.   ROSUVASTATIN (CRESTOR) 10 MG TABLET    Take 1 tablet (10 mg total) by mouth at bedtime.   WHEAT DEXTRIN  (BENEFIBER) POWD    Take by mouth. Use one capful with liquid daily for constipation  Modified Medications   No medications on file  Discontinued Medications   No  medications on file     Review of Systems  Unable to perform ROS: Dementia    Filed Vitals:   12/27/14 1120  BP: 116/68  Pulse: 67  Temp: 98 F (36.7 C)  TempSrc: Oral  Resp: 20  Height:  (1.549 m)  Weight: 181 lb (82.101 kg)  SpO2: 94%   Body mass index is 34.22 kg/(m^2).  Physical Exam  Constitutional: She appears well-developed and well-nourished. She appears lethargic. No distress.  Asleep but easily aroused sitting in chair  HENT:  Mouth/Throat: Oropharynx is clear and moist. No oropharyngeal exudate.  Eyes: Pupils are equal, round, and reactive to light. No scleral icterus.  Neck: Neck supple. No tracheal deviation present.  Cardiovascular: Normal rate, regular rhythm and intact distal pulses.  Exam reveals no gallop and no friction rub.   Murmur (1/6 SEM) heard. No LE edema b/l. no calf TTP. No carotid bruit b/l  Pulmonary/Chest: Effort normal and breath sounds normal. No stridor. No respiratory distress. She has no wheezes. She has no rales.  Abdominal: Soft. Bowel sounds are normal. She exhibits no distension and no mass. There is no tenderness. There is no rebound and no guarding.  Musculoskeletal: She exhibits edema and tenderness.  Lymphadenopathy:    She has no cervical adenopathy.  Neurological: She appears lethargic.  Skin: Skin is warm and dry. No rash noted.  Psychiatric: She has a normal mood and affect. Her speech is delayed. She is slowed. Thought content is delusional.     Labs reviewed: No visits with results within 3 Month(s) from this visit. Latest known visit with results is:  Office Visit on 07/27/2014  Component Date Value Ref Range Status  . Glucose 07/27/2014 114* 65 - 99 mg/dL Final  . BUN 40/98/1191 16  8 - 27 mg/dL Final  . Creatinine, Ser 07/27/2014 0.94  0.57 -  1.00 mg/dL Final  . GFR calc non Af Amer 07/27/2014 56* >59 mL/min/1.73 Final  . GFR calc Af Amer 07/27/2014 64  >59 mL/min/1.73 Final  . BUN/Creatinine Ratio 07/27/2014 17  11 - 26 Final  . Sodium 07/27/2014 142  134 - 144 mmol/L Final  . Potassium 07/27/2014 3.9  3.5 - 5.2 mmol/L Final  . Chloride 07/27/2014 99  97 - 108 mmol/L Final  . CO2 07/27/2014 28  18 - 29 mmol/L Final  . Calcium 07/27/2014 9.2  8.7 - 10.3 mg/dL Final     Assessment/Plan   ICD-9-CM ICD-10-CM   1. Dementia, with behavioral disturbance- taking namenda xr, exelon patch, prn haldol and melatonin 294.21 F03.91   2. Personal history of fall probably related to #1 and #7 V15.88 Z91.81   3. Depression - stable on celexa 311 F32.9   4. Hyperlipemia - stable on crestor 272.4 E78.5   5. History of stroke without residual deficits - on plavix V12.54 Z86.73   6. Chronic diastolic heart failure - on lasix and coreg 428.32 I50.32   7. Pain in joint involving multiple sites - pain controlled with tylenol 719.49 M25.50   8.      Hypertension - controlled on coreg and lasix 9.      GERD - stable on omeprazole   --DNR form completed x 2 and copy to be scanned into chart  --after discussing pt's multiple meds, daughter stated she prefers for Dr Renato Gails to review the [facility] form and complete it. She is willing to wait on it. Pt to [facility] TB [facility] test at [facility]  --continue current  medications as ordered. She will beak melatonin tab in 1/2 to reduce next day sedation.  --f/u with Dr Renato Gails as scheduled  Bertis Ruddy. Ancil Linsey  [facility] [address] [place], [place] [address] [phone] Office (Wednesdays and Fridays 8 AM - 5 PM) [phone] Cell (Monday-Friday 8 AM - 5 PM)

## 2014-12-27 NOTE — Patient Instructions (Addendum)
Keep appt with Dr Renato Gailseed as scheduled  Keep DNR form in visible site  Forms to be verfied/completed by Dr Renato Gailseed. [facility]

## 2014-12-29 ENCOUNTER — Other Ambulatory Visit: Payer: Self-pay | Admitting: Internal Medicine

## 2015-01-05 ENCOUNTER — Telehealth: Payer: Self-pay | Admitting: *Deleted

## 2015-01-05 ENCOUNTER — Encounter: Payer: Self-pay | Admitting: *Deleted

## 2015-01-05 MED ORDER — AMBULATORY NON FORMULARY MEDICATION: Status: DC

## 2015-01-05 NOTE — Telephone Encounter (Signed)
[facility] sent fax order for Medical Equipment due to patient moving to ALF ([facility]) in order to improve safety and reduce falls. Printed Rx for Equipment and given to Dr. Renato Gailseed to review and sign

## 2015-01-08 ENCOUNTER — Other Ambulatory Visit: Payer: Self-pay

## 2015-01-08 ENCOUNTER — Other Ambulatory Visit: Payer: Self-pay | Admitting: *Deleted

## 2015-01-08 DIAGNOSIS — M810 Age-related osteoporosis without current pathological fracture: Secondary | ICD-10-CM | POA: Diagnosis not present

## 2015-01-08 DIAGNOSIS — I10 Essential (primary) hypertension: Secondary | ICD-10-CM

## 2015-01-08 DIAGNOSIS — F0391 Unspecified dementia with behavioral disturbance: Secondary | ICD-10-CM | POA: Diagnosis not present

## 2015-01-08 DIAGNOSIS — Z9181 History of falling: Secondary | ICD-10-CM | POA: Diagnosis not present

## 2015-01-08 DIAGNOSIS — M6281 Muscle weakness (generalized): Secondary | ICD-10-CM | POA: Diagnosis not present

## 2015-01-08 DIAGNOSIS — I951 Orthostatic hypotension: Secondary | ICD-10-CM

## 2015-01-08 DIAGNOSIS — I509 Heart failure, unspecified: Secondary | ICD-10-CM | POA: Diagnosis not present

## 2015-01-08 DIAGNOSIS — K219 Gastro-esophageal reflux disease without esophagitis: Secondary | ICD-10-CM

## 2015-01-08 DIAGNOSIS — G3183 Dementia with Lewy bodies: Secondary | ICD-10-CM

## 2015-01-08 DIAGNOSIS — F319 Bipolar disorder, unspecified: Secondary | ICD-10-CM | POA: Diagnosis not present

## 2015-01-08 DIAGNOSIS — B372 Candidiasis of skin and nail: Secondary | ICD-10-CM

## 2015-01-08 DIAGNOSIS — G20 Parkinson's disease: Secondary | ICD-10-CM | POA: Diagnosis not present

## 2015-01-08 DIAGNOSIS — Z7902 Long term (current) use of antithrombotics/antiplatelets: Secondary | ICD-10-CM | POA: Diagnosis not present

## 2015-01-08 DIAGNOSIS — F3110 Bipolar disorder, current episode manic without psychotic features, unspecified: Secondary | ICD-10-CM

## 2015-01-08 DIAGNOSIS — R350 Frequency of micturition: Secondary | ICD-10-CM

## 2015-01-08 MED ORDER — ALBUTEROL SULFATE 1.25 MG/3ML IN NEBU: 1.0000 | INHALATION_SOLUTION | Freq: Four times a day (QID) | RESPIRATORY_TRACT | Status: AC | PRN

## 2015-01-08 MED ORDER — DOCUSATE SODIUM 100 MG PO CAPS: 100.0000 mg | ORAL_CAPSULE | Freq: Every day | ORAL | Status: DC

## 2015-01-08 MED ORDER — ALIGN 4 MG PO CAPS: 1.0000 | ORAL_CAPSULE | Freq: Every day | ORAL | Status: AC

## 2015-01-08 MED ORDER — MIRABEGRON ER 50 MG PO TB24: ORAL_TABLET | ORAL | Status: DC

## 2015-01-08 MED ORDER — FLUTICASONE PROPIONATE 0.05 % EX CREA: 1.0000 "application " | TOPICAL_CREAM | Freq: Two times a day (BID) | CUTANEOUS | Status: DC

## 2015-01-08 MED ORDER — MEMANTINE HCL ER 28 MG PO CP24: ORAL_CAPSULE | ORAL | Status: DC

## 2015-01-08 MED ORDER — NYSTATIN 100000 UNIT/GM EX POWD: CUTANEOUS | Status: DC

## 2015-01-08 MED ORDER — CARVEDILOL 6.25 MG PO TABS: 6.2500 mg | ORAL_TABLET | Freq: Two times a day (BID) | ORAL | Status: DC

## 2015-01-08 MED ORDER — ZOSTER VACCINE LIVE 19400 UNT/0.65ML ~~LOC~~ SOLR: 0.6500 mL | Freq: Once | SUBCUTANEOUS | Status: DC

## 2015-01-08 MED ORDER — CLOPIDOGREL BISULFATE 75 MG PO TABS: ORAL_TABLET | ORAL | Status: DC

## 2015-01-08 MED ORDER — FUROSEMIDE 20 MG PO TABS: ORAL_TABLET | ORAL | Status: DC

## 2015-01-08 MED ORDER — HALOPERIDOL 0.5 MG PO TABS: ORAL_TABLET | ORAL | Status: AC

## 2015-01-08 MED ORDER — TETANUS-DIPHTH-ACELL PERTUSSIS 5-2.5-18.5 LF-MCG/0.5 IM SUSP: 0.5000 mL | Freq: Once | INTRAMUSCULAR | Status: DC

## 2015-01-08 MED ORDER — VITAMIN D3 50 MCG (2000 UT) PO TABS: 2000.0000 [IU] | ORAL_TABLET | Freq: Every day | ORAL | Status: AC

## 2015-01-08 MED ORDER — CITALOPRAM HYDROBROMIDE 20 MG PO TABS: 20.0000 mg | ORAL_TABLET | Freq: Every day | ORAL | Status: DC

## 2015-01-08 MED ORDER — RIVASTIGMINE 4.6 MG/24HR TD PT24: 4.6000 mg | MEDICATED_PATCH | Freq: Every day | TRANSDERMAL | Status: DC

## 2015-01-08 MED ORDER — OMEPRAZOLE 20 MG PO CPDR: DELAYED_RELEASE_CAPSULE | ORAL | Status: DC

## 2015-01-08 MED ORDER — ROSUVASTATIN CALCIUM 10 MG PO TABS: 10.0000 mg | ORAL_TABLET | Freq: Every day | ORAL | Status: DC

## 2015-01-08 NOTE — Telephone Encounter (Signed)
Selena BattenKim, daughter called and wants all of patients Rx's faxed to [facility] Fax # [phone] Attention [place]. She wants them faxed so patient can have her medications as soon as she gets to the facility. Printed and given to Dr. Renato Gailseed to review and sign.

## 2015-01-09 DIAGNOSIS — G20 Parkinson's disease: Secondary | ICD-10-CM | POA: Diagnosis not present

## 2015-01-09 DIAGNOSIS — I509 Heart failure, unspecified: Secondary | ICD-10-CM | POA: Diagnosis not present

## 2015-01-09 DIAGNOSIS — I951 Orthostatic hypotension: Secondary | ICD-10-CM | POA: Diagnosis not present

## 2015-01-09 DIAGNOSIS — M6281 Muscle weakness (generalized): Secondary | ICD-10-CM | POA: Diagnosis not present

## 2015-01-09 DIAGNOSIS — F0391 Unspecified dementia with behavioral disturbance: Secondary | ICD-10-CM | POA: Diagnosis not present

## 2015-01-09 DIAGNOSIS — F319 Bipolar disorder, unspecified: Secondary | ICD-10-CM | POA: Diagnosis not present

## 2015-01-17 ENCOUNTER — Telehealth: Payer: Self-pay

## 2015-01-17 NOTE — Telephone Encounter (Signed)
I know you and I did a ton of paperwork (date I am uncertain), but I was not aware of anymore.  Initially we did the FL2, diet, and activity forms.  I don't have any paperwork with me so if it was not in my file or they did not get it back, it was never received.  The completed paperwork should have been scanned so we can tell what we already did.

## 2015-01-17 NOTE — Telephone Encounter (Signed)
Holly Grimes was calling to check the status of paperwork sent over on Monday from [facility]. Patients daughter needs paperwork addressed today because she is going out of town. I checked with Triage Assistant Synetta Failnita, paperwork came over on Monday and was given to Dr.Reed to address.  Holly Grimes states [facility] at [facility] at the time of call and they have not received a response from our office.   I received another copy of paperwork today, will put in folder for Dr.Green to review and sign.

## 2015-01-18 DIAGNOSIS — I509 Heart failure, unspecified: Secondary | ICD-10-CM | POA: Diagnosis not present

## 2015-01-18 DIAGNOSIS — G20 Parkinson's disease: Secondary | ICD-10-CM | POA: Diagnosis not present

## 2015-01-18 DIAGNOSIS — M6281 Muscle weakness (generalized): Secondary | ICD-10-CM | POA: Diagnosis not present

## 2015-01-18 DIAGNOSIS — I951 Orthostatic hypotension: Secondary | ICD-10-CM | POA: Diagnosis not present

## 2015-01-18 DIAGNOSIS — F319 Bipolar disorder, unspecified: Secondary | ICD-10-CM | POA: Diagnosis not present

## 2015-01-18 DIAGNOSIS — F0391 Unspecified dementia with behavioral disturbance: Secondary | ICD-10-CM | POA: Diagnosis not present

## 2015-01-23 ENCOUNTER — Telehealth: Payer: Self-pay | Admitting: *Deleted

## 2015-01-23 DIAGNOSIS — F319 Bipolar disorder, unspecified: Secondary | ICD-10-CM | POA: Diagnosis not present

## 2015-01-23 DIAGNOSIS — I509 Heart failure, unspecified: Secondary | ICD-10-CM | POA: Diagnosis not present

## 2015-01-23 DIAGNOSIS — I951 Orthostatic hypotension: Secondary | ICD-10-CM | POA: Diagnosis not present

## 2015-01-23 DIAGNOSIS — F0391 Unspecified dementia with behavioral disturbance: Secondary | ICD-10-CM | POA: Diagnosis not present

## 2015-01-23 DIAGNOSIS — G20 Parkinson's disease: Secondary | ICD-10-CM | POA: Diagnosis not present

## 2015-01-23 DIAGNOSIS — M6281 Muscle weakness (generalized): Secondary | ICD-10-CM | POA: Diagnosis not present

## 2015-01-23 NOTE — Telephone Encounter (Signed)
Received Paperwork from [facility] to clarify patients medications. Given to Dr. Chilton SiGreen to review and sign.

## 2015-01-23 NOTE — Telephone Encounter (Signed)
Dr. Chilton SiGreen filled out for the 3rd time and faxed back to [facility]

## 2015-01-30 DIAGNOSIS — M6281 Muscle weakness (generalized): Secondary | ICD-10-CM | POA: Diagnosis not present

## 2015-01-30 DIAGNOSIS — I951 Orthostatic hypotension: Secondary | ICD-10-CM | POA: Diagnosis not present

## 2015-01-30 DIAGNOSIS — I509 Heart failure, unspecified: Secondary | ICD-10-CM | POA: Diagnosis not present

## 2015-01-30 DIAGNOSIS — F0391 Unspecified dementia with behavioral disturbance: Secondary | ICD-10-CM | POA: Diagnosis not present

## 2015-01-30 DIAGNOSIS — F319 Bipolar disorder, unspecified: Secondary | ICD-10-CM | POA: Diagnosis not present

## 2015-01-30 DIAGNOSIS — G20 Parkinson's disease: Secondary | ICD-10-CM | POA: Diagnosis not present

## 2015-02-01 ENCOUNTER — Ambulatory Visit (INDEPENDENT_AMBULATORY_CARE_PROVIDER_SITE_OTHER): Payer: Medicare Other | Admitting: Internal Medicine

## 2015-02-01 ENCOUNTER — Encounter: Payer: Self-pay | Admitting: Internal Medicine

## 2015-02-01 VITALS — BP 128/78 | HR 74 | Temp 98.1°F | Resp 16 | Ht 61.0 in | Wt 190.6 lb

## 2015-02-01 DIAGNOSIS — F0280 Dementia in other diseases classified elsewhere without behavioral disturbance: Secondary | ICD-10-CM

## 2015-02-01 DIAGNOSIS — R4182 Altered mental status, unspecified: Secondary | ICD-10-CM | POA: Diagnosis not present

## 2015-02-01 DIAGNOSIS — G20 Parkinson's disease: Secondary | ICD-10-CM

## 2015-02-01 DIAGNOSIS — Z9181 History of falling: Secondary | ICD-10-CM | POA: Diagnosis not present

## 2015-02-01 DIAGNOSIS — R609 Edema, unspecified: Secondary | ICD-10-CM | POA: Diagnosis not present

## 2015-02-01 DIAGNOSIS — I10 Essential (primary) hypertension: Secondary | ICD-10-CM

## 2015-02-01 DIAGNOSIS — B3789 Other sites of candidiasis: Secondary | ICD-10-CM

## 2015-02-01 DIAGNOSIS — I5032 Chronic diastolic (congestive) heart failure: Secondary | ICD-10-CM | POA: Diagnosis not present

## 2015-02-01 DIAGNOSIS — G4700 Insomnia, unspecified: Secondary | ICD-10-CM

## 2015-02-01 DIAGNOSIS — K5901 Slow transit constipation: Secondary | ICD-10-CM

## 2015-02-01 DIAGNOSIS — G3183 Dementia with Lewy bodies: Secondary | ICD-10-CM

## 2015-02-01 LAB — POCT URINALYSIS DIPSTICK
Bilirubin, UA: NEGATIVE
Blood, UA: NEGATIVE
Glucose, UA: NEGATIVE
Ketones, UA: NEGATIVE
Leukocytes, UA: NEGATIVE
Nitrite, UA: NEGATIVE
Protein, UA: NEGATIVE
Spec Grav, UA: 1.015
Urobilinogen, UA: 0.2
pH, UA: 6

## 2015-02-01 MED ORDER — POLYETHYLENE GLYCOL 3350 17 GM/SCOOP PO POWD: 17.0000 g | Freq: Every day | ORAL | Status: DC | PRN

## 2015-02-01 MED ORDER — MELATONIN 1 MG/4ML PO LIQD
1.0000 mL | Freq: Every evening | ORAL | Status: AC | PRN
Start: 2015-02-01 — End: ?

## 2015-02-01 MED ORDER — PRAMOX-PE-GLYCERIN-PETROLATUM 1-0.25-14.4-15 % RE CREA: TOPICAL_CREAM | RECTAL | Status: AC

## 2015-02-01 MED ORDER — DOCUSATE SODIUM 100 MG PO CAPS: 100.0000 mg | ORAL_CAPSULE | Freq: Two times a day (BID) | ORAL | Status: DC

## 2015-02-01 MED ORDER — MICONAZOLE NITRATE 2 % EX POWD: CUTANEOUS | Status: DC | PRN

## 2015-02-01 NOTE — Progress Notes (Signed)
Patient ID: Holly SchwalbeJoan M Deane, female   DOB: 04-24-1927, 81 y.o.   MRN: [id number]   Location[facility]  [facility]  Code Status: DNR Goals of Care: Advanced Directive information Does patient have an advance directive?: Yes, Type of Advance Directive: Healthcare Power [place];Living will;Out of facility DNR (pink MOST or yellow form), Pre-existing out of facility DNR order (yellow form or pink MOST form): Yellow form placed in chart (order not valid for inpatient use), Does patient want to make changes to advanced directive?: No - Patient declined   Allergies  Allergen Reactions  . Cephalexin Nausea And Vomiting  . Sulfa Antibiotics Other (See Comments)    Daughter unsure of reaction, but believes it is severe  . Sulfites     unknown    Chief Complaint  Patient presents with  . Medical Management of Chronic Issues    3 month follow-up   . Cough    Dry cough x 1 week, 2 days ago patient coughed up light yellow phlegm (tried delysm)  . Weight Gain    Patient with weight gain since recent move, ? related to diet or early signs of CHF  . Orders    New RX for custom fit compression stockings (8-15 hg)  . Orders    Order change request, change to daily dry weight  . Medication Management    Discuss changing colace to BID due to BM changes. Discuss changing melatonin to liquid form. Discuss adding Lotramin OTC for skin folds and Miralax. Discuss standing order for preparation H OTC.   Marland Kitchen. Altered Mental Status    ? UTI    HPI: Patient is a 81 y.o. white female seen in the office today for med mgt.    Lots of starchy foods, canned fruits.    Having coughing and leg swelling.    Urine dipstick was negative.    Needs a bm.  None in 2 days.    Melatonin liquid instead of 0.25mg  dose due to low dose.    Lotrimin seems to do the trick for yeast rashes beneath skin folds.    Compression hose--8-4415mm Hg strength.  Her daughter wants her to get a  merry walker.  Has been sitting more in wheelchair.    Hemorrhoids are irritated and need order for cream.  Review of Systems:  Review of Systems  Constitutional: Negative for fever and chills.  HENT: Positive for congestion.   Eyes: Negative for blurred vision.  Respiratory: Positive for cough. Negative for shortness of breath.   Cardiovascular: Negative for chest pain and palpitations.  Gastrointestinal: Positive for constipation. Negative for abdominal pain.  Genitourinary: Negative for dysuria.  Musculoskeletal: Negative for myalgias.  Neurological: Negative for dizziness and loss of consciousness.  Endo/Heme/Allergies: Bruises/bleeds easily.  Psychiatric/Behavioral: Positive for memory loss.     Past Medical History  Diagnosis Date  . Hypertension   . High cholesterol   . Mitral valve prolapse   . Dementia   . Parkinson's disease, Lewy body   . Unspecified constipation   . Onychia and paronychia of toe   . Spasm of muscle   . Abnormality of gait   . Orthostatic hypotension   . Major depressive disorder, single episode, unspecified   . Diaphragmatic hernia without mention of obstruction or gangrene   . Alzheimer's disease   . Osteoarthrosis, unspecified whether generalized or localized, unspecified site   . Osteoporosis, unspecified   . Unspecified constipation   . Sprain  of ribs   . Delirium due to conditions classified elsewhere   . Diarrhea   . Edema   . Pain in joint, pelvic region and thigh   . Other malaise and fatigue   . Urinary frequency [id number]  . Personal history of fall   . Female stress incontinence   . Other and unspecified hyperlipidemia   . Obesity, unspecified   . Osteoarthrosis, unspecified whether generalized or localized, unspecified site   . Pain in joint, shoulder region   . Transient ischemic attack (TIA), and cerebral infarction without residual deficits(V12.54)   . Risk for falls     Past Surgical History  Procedure Laterality Date   . Joint replacement    . Tonsillectomy    . Ankle surgery Left 1998  . Total knee arthroplasty Right 2002  . Total knee arthroplasty Left 2005  . Hip arthroplasty Right 09/24/2013    Procedure: ARTHROPLASTY BIPOLAR HIP;  Surgeon: Loanne Drilling, MD;  Location: [facility];  Service: Orthopedics;  Laterality: Right;    Social History:   reports that she has never smoked. She does not have any smokeless tobacco history on file. She reports that she does not drink alcohol or use illicit drugs.  Family History  Problem Relation Age of Onset  . Adopted: Yes    Medications: Patient's Medications  New Prescriptions   No medications on file  Previous Medications   ACETAMINOPHEN (TYLENOL) 650 MG CR TABLET    Take (2) 650 mg tablets by mouth BID (8 hours formulation)   ALBUTEROL (ACCUNEB) 1.25 MG/3ML NEBULIZER SOLUTION    Take 3 mLs (1.25 mg total) by nebulization every 6 (six) hours as needed for wheezing.   AMBULATORY NON FORMULARY MEDICATION    1. High Low Hospital Bed to allow for safe tranfers 2. Scoop Mattress 3. Bed Alarm and Chair Alarm-to reduce fall risk 4. (2) Halo Bed Rails 5. Fall Mat Dx: G30.9, I95.1, G20, Z91.81   ASCORBIC ACID (VITAMIN C WITH ROSE HIPS) 1000 MG TABLET    Take 1,000 mg by mouth daily.     CALCIUM CITRATE-VITAMIN D 500-400 MG-UNIT CHEWABLE TABLET    Chew 1 tablet by mouth 2 (two) times daily.   CARVEDILOL (COREG) 6.25 MG TABLET    Take 1 tablet (6.25 mg total) by mouth 2 (two) times daily with a meal.   CHOLECALCIFEROL (VITAMIN D3) 2000 UNITS TABS    Take 2,000 Units by mouth daily.   CITALOPRAM (CELEXA) 20 MG TABLET    Take 1 tablet (20 mg total) by mouth daily.   CLOPIDOGREL (PLAVIX) 75 MG TABLET    Take one tablet by mouth once daily to help circulation   DOCUSATE SODIUM (COLACE) 100 MG CAPSULE    Take 1 capsule (100 mg total) by mouth at bedtime.   FEEDING SUPPLEMENT (BOOST HIGH PROTEIN) LIQD    Take 1 Container by mouth as needed.    FLUTICASONE  (CUTIVATE) 0.05 % CREAM    Apply 1 application topically 2 (two) times daily. To affected areas as needed for skin irritaion   FUROSEMIDE (LASIX) 20 MG TABLET    Take one tablet by mouth once daily unless weight gain of 3 pounds in one day or 5 pounds in one week  take two tablets by mouth as needed for swelling   GUAIFENESIN (MUCINEX MAXIMUM STRENGTH) 1200 MG TB12    Take by mouth daily.    HALOPERIDOL (HALDOL) 0.5 MG TABLET    Take one tablet by  mouth once daily as needed for agitation/psychosis   MELATONIN 1 MG TABS    Take by mouth. 1/4 MG Daily as needed   MEMANTINE (NAMENDA XR) 28 MG CP24 24 HR CAPSULE    Take one capsule by mouth once daily for memory   MIRABEGRON ER (MYRBETRIQ) 50 MG TB24 TABLET    Take one tablet by mouth once daily for bladder   MULTIPLE VITAMINS-MINERALS (CENTRUM SILVER ULTRA WOMENS PO)    Take 1 tablet by mouth every morning.     NYSTATIN (MYCOSTATIN/NYSTOP) 100000 UNIT/GM POWD    2 times a day for 2 weeks in LT auxillia   OMEGA-3 ACID ETHYL ESTERS (LOVAZA) 1 G CAPSULE    TAKE (2) CAPSULES BY MOUTH TWICE DAILY.   OMEPRAZOLE (PRILOSEC) 20 MG CAPSULE    Take one capsule by mouth every day to reduce stomach acid secretion   PROBIOTIC PRODUCT (ALIGN) 4 MG CAPS    Take 1 capsule by mouth daily.   RIVASTIGMINE (EXELON) 4.6 MG/24HR    Place 1 patch (4.6 mg total) onto the skin daily.   ROSUVASTATIN (CRESTOR) 10 MG TABLET    Take 1 tablet (10 mg total) by mouth at bedtime.   TDAP (BOOSTRIX) 5-2.5-18.5 LF-MCG/0.5 INJECTION    Inject 0.5 mLs into the muscle once.   WHEAT DEXTRIN (BENEFIBER) POWD    Take by mouth. Use one capful with liquid daily for constipation   ZOSTER VACCINE LIVE, PF, (ZOSTAVAX) [address] UNT/0.65ML INJECTION    Inject 19,400 Units into the skin once.  Modified Medications   No medications on file  Discontinued Medications   CALCIUM CITRATE (CITRACAL PO)    Take by mouth 2 (two) times daily.     Physical Exam: Filed Vitals:   02/01/15 1546  BP: 128/78    Pulse: 74  Temp: 98.1 F (36.7 C)  TempSrc: Oral  Resp: 16  Height: 5\' 1"  (1.549 m)  Weight: 190 lb 9.6 oz (86.456 kg)  SpO2: 94%  Physical Exam  Constitutional: She appears well-developed and well-nourished. No distress.  Cardiovascular: Normal rate, regular rhythm, normal heart sounds and intact distal pulses.   Pulmonary/Chest: Effort normal and breath sounds normal. She has no rales.  Abdominal: Soft. Bowel sounds are normal. She exhibits no distension and no mass. There is no tenderness.  Musculoskeletal: Normal range of motion. She exhibits no edema or tenderness.  Neurological: She is alert.  Oriented to person only  Skin: Skin is warm and dry.  Psychiatric: She has a normal mood and affect.     Labs reviewed: Basic Metabolic Panel:  Recent Labs  60/45/40 0443 06/13/14 0400 07/27/14 1559  NA 141 143 142  K 3.5* 3.4* 3.9  CL 96 98 99  CO2 28 33* 28  GLUCOSE 128* 110* 114*  BUN 21 25* 16  CREATININE 0.73 0.82 0.94  CALCIUM 9.3 9.2 9.2   Liver Function Tests:  Recent Labs  06/08/14 1642  AST 28  ALT 12  ALKPHOS 52  BILITOT 1.1  PROT 7.5  ALBUMIN 3.2*   No results for input(s): LIPASE, AMYLASE in the last 8760 hours. No results for input(s): AMMONIA in the last 8760 hours. CBC:  Recent Labs  06/08/14 1642 06/09/14 0530  WBC 9.5 9.0  NEUTROABS 7.5  --   HGB 13.8 12.6  HCT 40.9 38.3  MCV 98.3 98.7  PLT 177 150   Assessment/Plan 1. Altered mental status, unspecified altered mental status type -likely due to adjusting to new home  in [place] at [facility] - POCT urinalysis dipstick  2. Chronic diastolic heart failure -cont current lasix and monitor weight daily before breakfast (was being done at variable times) -is on coreg  3. Personal history of fall -none recently, doing better with this it seems since move  4. Essential hypertension -bp at goal with current meds, no changes  5. Parkinson's disease, Lewy body -stable, unclear  for sure exact cause of her dementia -her daughter believes she has bipolar with manic episodes, but I think her behaviors are simply due to her dementia  6. Slow transit constipation -advised to increase colace to bid with prn miralax daily due to worsening constipation with dietary changes since moving to facility from home  7. Edema -cont lasix and elevating feet at rest, plus new Rx for compression hose written - Compression stockings  8. Insomnia -melatonin liquid Rx written for HG due to very low dose used  9. Candida rash of groin -lotrimin powder added to help with yeast rashes under skin folds  10.  Hemorrhoids -preparation H order written for HG  Labs/tests ordered: no new F/u appt 3 mos  Arath Kaigler L. Renesmay Nesbitt, D.O. Geriatrics [facility] [address], [place] [address] Cell Phone (Mon-Fri 8am-5pm):  [phone] On Call:  [phone] & follow prompts after 5pm & weekends Office Phone:  [phone] Office Fax:  [phone]

## 2015-02-02 DIAGNOSIS — F319 Bipolar disorder, unspecified: Secondary | ICD-10-CM | POA: Diagnosis not present

## 2015-02-02 DIAGNOSIS — I951 Orthostatic hypotension: Secondary | ICD-10-CM | POA: Diagnosis not present

## 2015-02-02 DIAGNOSIS — I509 Heart failure, unspecified: Secondary | ICD-10-CM | POA: Diagnosis not present

## 2015-02-02 DIAGNOSIS — M6281 Muscle weakness (generalized): Secondary | ICD-10-CM | POA: Diagnosis not present

## 2015-02-02 DIAGNOSIS — F0391 Unspecified dementia with behavioral disturbance: Secondary | ICD-10-CM | POA: Diagnosis not present

## 2015-02-02 DIAGNOSIS — G20 Parkinson's disease: Secondary | ICD-10-CM | POA: Diagnosis not present

## 2015-02-06 DIAGNOSIS — I951 Orthostatic hypotension: Secondary | ICD-10-CM | POA: Diagnosis not present

## 2015-02-06 DIAGNOSIS — G20 Parkinson's disease: Secondary | ICD-10-CM | POA: Diagnosis not present

## 2015-02-06 DIAGNOSIS — F319 Bipolar disorder, unspecified: Secondary | ICD-10-CM | POA: Diagnosis not present

## 2015-02-06 DIAGNOSIS — I509 Heart failure, unspecified: Secondary | ICD-10-CM | POA: Diagnosis not present

## 2015-02-06 DIAGNOSIS — F0391 Unspecified dementia with behavioral disturbance: Secondary | ICD-10-CM | POA: Diagnosis not present

## 2015-02-06 DIAGNOSIS — M6281 Muscle weakness (generalized): Secondary | ICD-10-CM | POA: Diagnosis not present

## 2015-02-08 DIAGNOSIS — G20 Parkinson's disease: Secondary | ICD-10-CM | POA: Diagnosis not present

## 2015-02-08 DIAGNOSIS — I509 Heart failure, unspecified: Secondary | ICD-10-CM | POA: Diagnosis not present

## 2015-02-08 DIAGNOSIS — M6281 Muscle weakness (generalized): Secondary | ICD-10-CM | POA: Diagnosis not present

## 2015-02-08 DIAGNOSIS — F319 Bipolar disorder, unspecified: Secondary | ICD-10-CM | POA: Diagnosis not present

## 2015-02-08 DIAGNOSIS — I951 Orthostatic hypotension: Secondary | ICD-10-CM | POA: Diagnosis not present

## 2015-02-08 DIAGNOSIS — F0391 Unspecified dementia with behavioral disturbance: Secondary | ICD-10-CM | POA: Diagnosis not present

## 2015-02-13 DIAGNOSIS — I509 Heart failure, unspecified: Secondary | ICD-10-CM | POA: Diagnosis not present

## 2015-02-13 DIAGNOSIS — M6281 Muscle weakness (generalized): Secondary | ICD-10-CM | POA: Diagnosis not present

## 2015-02-13 DIAGNOSIS — G20 Parkinson's disease: Secondary | ICD-10-CM | POA: Diagnosis not present

## 2015-02-13 DIAGNOSIS — F319 Bipolar disorder, unspecified: Secondary | ICD-10-CM | POA: Diagnosis not present

## 2015-02-13 DIAGNOSIS — I951 Orthostatic hypotension: Secondary | ICD-10-CM | POA: Diagnosis not present

## 2015-02-13 DIAGNOSIS — F0391 Unspecified dementia with behavioral disturbance: Secondary | ICD-10-CM | POA: Diagnosis not present

## 2015-02-22 DIAGNOSIS — I509 Heart failure, unspecified: Secondary | ICD-10-CM | POA: Diagnosis not present

## 2015-02-22 DIAGNOSIS — I951 Orthostatic hypotension: Secondary | ICD-10-CM | POA: Diagnosis not present

## 2015-02-22 DIAGNOSIS — M6281 Muscle weakness (generalized): Secondary | ICD-10-CM | POA: Diagnosis not present

## 2015-02-22 DIAGNOSIS — G20 Parkinson's disease: Secondary | ICD-10-CM | POA: Diagnosis not present

## 2015-02-22 DIAGNOSIS — F0391 Unspecified dementia with behavioral disturbance: Secondary | ICD-10-CM | POA: Diagnosis not present

## 2015-02-22 DIAGNOSIS — F319 Bipolar disorder, unspecified: Secondary | ICD-10-CM | POA: Diagnosis not present

## 2015-02-27 DIAGNOSIS — I951 Orthostatic hypotension: Secondary | ICD-10-CM | POA: Diagnosis not present

## 2015-02-27 DIAGNOSIS — F319 Bipolar disorder, unspecified: Secondary | ICD-10-CM | POA: Diagnosis not present

## 2015-02-27 DIAGNOSIS — F0391 Unspecified dementia with behavioral disturbance: Secondary | ICD-10-CM | POA: Diagnosis not present

## 2015-02-27 DIAGNOSIS — G20 Parkinson's disease: Secondary | ICD-10-CM | POA: Diagnosis not present

## 2015-02-27 DIAGNOSIS — M6281 Muscle weakness (generalized): Secondary | ICD-10-CM | POA: Diagnosis not present

## 2015-02-27 DIAGNOSIS — I509 Heart failure, unspecified: Secondary | ICD-10-CM | POA: Diagnosis not present

## 2015-03-01 DIAGNOSIS — G20 Parkinson's disease: Secondary | ICD-10-CM | POA: Diagnosis not present

## 2015-03-01 DIAGNOSIS — F319 Bipolar disorder, unspecified: Secondary | ICD-10-CM | POA: Diagnosis not present

## 2015-03-01 DIAGNOSIS — F0391 Unspecified dementia with behavioral disturbance: Secondary | ICD-10-CM | POA: Diagnosis not present

## 2015-03-01 DIAGNOSIS — I951 Orthostatic hypotension: Secondary | ICD-10-CM | POA: Diagnosis not present

## 2015-03-01 DIAGNOSIS — I509 Heart failure, unspecified: Secondary | ICD-10-CM | POA: Diagnosis not present

## 2015-03-01 DIAGNOSIS — M6281 Muscle weakness (generalized): Secondary | ICD-10-CM | POA: Diagnosis not present

## 2015-03-07 DIAGNOSIS — I951 Orthostatic hypotension: Secondary | ICD-10-CM | POA: Diagnosis not present

## 2015-03-07 DIAGNOSIS — I509 Heart failure, unspecified: Secondary | ICD-10-CM | POA: Diagnosis not present

## 2015-03-07 DIAGNOSIS — F319 Bipolar disorder, unspecified: Secondary | ICD-10-CM | POA: Diagnosis not present

## 2015-03-07 DIAGNOSIS — M6281 Muscle weakness (generalized): Secondary | ICD-10-CM | POA: Diagnosis not present

## 2015-03-07 DIAGNOSIS — F0391 Unspecified dementia with behavioral disturbance: Secondary | ICD-10-CM | POA: Diagnosis not present

## 2015-03-07 DIAGNOSIS — G20 Parkinson's disease: Secondary | ICD-10-CM | POA: Diagnosis not present

## 2015-03-09 DIAGNOSIS — M810 Age-related osteoporosis without current pathological fracture: Secondary | ICD-10-CM | POA: Diagnosis not present

## 2015-03-09 DIAGNOSIS — F319 Bipolar disorder, unspecified: Secondary | ICD-10-CM | POA: Diagnosis not present

## 2015-03-09 DIAGNOSIS — I509 Heart failure, unspecified: Secondary | ICD-10-CM | POA: Diagnosis not present

## 2015-03-09 DIAGNOSIS — I951 Orthostatic hypotension: Secondary | ICD-10-CM | POA: Diagnosis not present

## 2015-03-09 DIAGNOSIS — Z7902 Long term (current) use of antithrombotics/antiplatelets: Secondary | ICD-10-CM | POA: Diagnosis not present

## 2015-03-09 DIAGNOSIS — G20 Parkinson's disease: Secondary | ICD-10-CM | POA: Diagnosis not present

## 2015-03-09 DIAGNOSIS — I10 Essential (primary) hypertension: Secondary | ICD-10-CM | POA: Diagnosis not present

## 2015-03-09 DIAGNOSIS — F0281 Dementia in other diseases classified elsewhere with behavioral disturbance: Secondary | ICD-10-CM | POA: Diagnosis not present

## 2015-03-09 DIAGNOSIS — I2510 Atherosclerotic heart disease of native coronary artery without angina pectoris: Secondary | ICD-10-CM | POA: Diagnosis not present

## 2015-03-09 DIAGNOSIS — Z9181 History of falling: Secondary | ICD-10-CM | POA: Diagnosis not present

## 2015-03-13 ENCOUNTER — Ambulatory Visit (INDEPENDENT_AMBULATORY_CARE_PROVIDER_SITE_OTHER): Payer: Medicare Other | Admitting: Nurse Practitioner

## 2015-03-13 ENCOUNTER — Ambulatory Visit
Admission: RE | Admit: 2015-03-13 | Discharge: 2015-03-13 | Disposition: A | Discharge status: Home / Self Care | Payer: Medicare Other | Source: Ambulatory Visit | Attending: Nurse Practitioner | Admitting: Nurse Practitioner

## 2015-03-13 ENCOUNTER — Encounter: Payer: Self-pay | Admitting: Nurse Practitioner

## 2015-03-13 VITALS — BP 140/82 | HR 64 | Temp 98.1°F | Resp 20 | Ht 62.0 in | Wt 192.4 lb

## 2015-03-13 DIAGNOSIS — R5383 Other fatigue: Secondary | ICD-10-CM

## 2015-03-13 DIAGNOSIS — N898 Other specified noninflammatory disorders of vagina: Secondary | ICD-10-CM | POA: Diagnosis not present

## 2015-03-13 DIAGNOSIS — R609 Edema, unspecified: Secondary | ICD-10-CM

## 2015-03-13 DIAGNOSIS — K219 Gastro-esophageal reflux disease without esophagitis: Secondary | ICD-10-CM

## 2015-03-13 DIAGNOSIS — R0602 Shortness of breath: Secondary | ICD-10-CM | POA: Diagnosis not present

## 2015-03-13 DIAGNOSIS — R5381 Other malaise: Secondary | ICD-10-CM | POA: Diagnosis not present

## 2015-03-13 DIAGNOSIS — I5032 Chronic diastolic (congestive) heart failure: Secondary | ICD-10-CM

## 2015-03-13 MED ORDER — PANTOPRAZOLE SODIUM 40 MG PO TBEC: 40.0000 mg | DELAYED_RELEASE_TABLET | Freq: Every day | ORAL | Status: DC

## 2015-03-13 NOTE — Progress Notes (Signed)
Patient ID: Holly Grimes, female   DOB: September 25, 1927, 81 y.o.   MRN: [id number]    PCP: Bufford Spikes, DO  Allergies  Allergen Reactions  . Cephalexin Nausea And Vomiting  . Sulfa Antibiotics Other (See Comments)    Daughter unsure of reaction, but believes it is severe  . Sulfites     unknown    Chief Complaint  Patient presents with  . Acute Visit    Reflux and swelling,vomiting ,fatigue , wheezing coughing at night      HPI: Patient is a 81 y.o. female seen in the office today for several acute issues per daughter. Pt of Dr Renato Gails with a pmh of dementia, hyperlipemia, CHF, depression, GERD who is living in [place]. Daughter here providing the hx. Reports worsening shortness of breath at night, has hospital bed and elevates HOB at night. Daughter reports increased wheezing. Weight gain noted. More fatigue than normal   Also spitting up after meals. Hx of Hiatal hernia on omeprazole for many years.   Concerns of hygiene and peri-care. Increase in scratching. Strong odor in brief but resolved after good bath, noted 3 days ago, has been okay since.   Does not like current care given at [facility]. Moving pt to independent living [facility])  tomorrow with around the clock caregivers with dementia experience. Daughter  will have over lapping care during the morning hours and bath time.   Review of Systems:  Review of Systems  Unable to perform ROS: Dementia    Past Medical History  Diagnosis Date  . Hypertension   . High cholesterol   . Mitral valve prolapse   . Dementia   . Parkinson's disease, Lewy body   . Unspecified constipation   . Onychia and paronychia of toe   . Spasm of muscle   . Abnormality of gait   . Orthostatic hypotension   . Major depressive disorder, single episode, unspecified   . Diaphragmatic hernia without mention of obstruction or gangrene   . Alzheimer's disease   . Osteoarthrosis, unspecified whether generalized or localized, unspecified site    . Osteoporosis, unspecified   . Unspecified constipation   . Sprain of ribs   . Delirium due to conditions classified elsewhere   . Diarrhea   . Edema   . Pain in joint, pelvic region and thigh   . Other malaise and fatigue   . Urinary frequency [id number]  . Personal history of fall   . Female stress incontinence   . Other and unspecified hyperlipidemia   . Obesity, unspecified   . Osteoarthrosis, unspecified whether generalized or localized, unspecified site   . Pain in joint, shoulder region   . Transient ischemic attack (TIA), and cerebral infarction without residual deficits(V12.54)   . Risk for falls    Past Surgical History  Procedure Laterality Date  . Joint replacement    . Tonsillectomy    . Ankle surgery Left 1998  . Total knee arthroplasty Right 2002  . Total knee arthroplasty Left 2005  . Hip arthroplasty Right 09/24/2013    Procedure: ARTHROPLASTY BIPOLAR HIP;  Surgeon: Loanne Drilling, MD;  Location: [facility];  Service: Orthopedics;  Laterality: Right;   Social History:   reports that she has never smoked. She does not have any smokeless tobacco history on file. She reports that she does not drink alcohol or use illicit drugs.  Family History  Problem Relation Age of Onset  . Adopted: Yes    Medications: Patient's Medications  New Prescriptions   No medications on file  Previous Medications   ACETAMINOPHEN (TYLENOL) 650 MG CR TABLET    Take (2) 650 mg tablets by mouth BID (8 hours formulation)   ALBUTEROL (ACCUNEB) 1.25 MG/3ML NEBULIZER SOLUTION    Take 3 mLs (1.25 mg total) by nebulization every 6 (six) hours as needed for wheezing.   AMBULATORY NON FORMULARY MEDICATION    1. High Low Hospital Bed to allow for safe tranfers 2. Scoop Mattress 3. Bed Alarm and Chair Alarm-to reduce fall risk 4. (2) Halo Bed Rails 5. Fall Mat Dx: G30.9, I95.1, G20, Z91.81   ASCORBIC ACID (VITAMIN C WITH ROSE HIPS) 1000 MG TABLET    Take 1,000 mg by mouth daily.      CALCIUM CITRATE-VITAMIN D 500-400 MG-UNIT CHEWABLE TABLET    Chew 1 tablet by mouth 2 (two) times daily.   CARVEDILOL (COREG) 6.25 MG TABLET    Take 1 tablet (6.25 mg total) by mouth 2 (two) times daily with a meal.   CHOLECALCIFEROL (VITAMIN D3) 2000 UNITS TABS    Take 2,000 Units by mouth daily.   CITALOPRAM (CELEXA) 20 MG TABLET    Take 1 tablet (20 mg total) by mouth daily.   CLOPIDOGREL (PLAVIX) 75 MG TABLET    Take one tablet by mouth once daily to help circulation   DOCUSATE SODIUM (COLACE) 100 MG CAPSULE    Take 1 capsule (100 mg total) by mouth 2 (two) times daily.   FEEDING SUPPLEMENT (BOOST HIGH PROTEIN) LIQD    Take 1 Container by mouth as needed.    FLUTICASONE (CUTIVATE) 0.05 % CREAM    Apply 1 application topically 2 (two) times daily. To affected areas as needed for skin irritaion   FUROSEMIDE (LASIX) 20 MG TABLET    Take one tablet by mouth once daily unless weight gain of 3 pounds in one day or 5 pounds in one week  take two tablets by mouth as needed for swelling   GUAIFENESIN (MUCINEX MAXIMUM STRENGTH) 1200 MG TB12    Take by mouth daily.    HALOPERIDOL (HALDOL) 0.5 MG TABLET    Take one tablet by mouth once daily as needed for agitation/psychosis   MELATONIN 1 MG/4ML LIQD    Take 1 mL (0.25 mg total) by mouth at bedtime as needed (insomnia).   MEMANTINE (NAMENDA XR) 28 MG CP24 24 HR CAPSULE    Take one capsule by mouth once daily for memory   MICONAZOLE (LOTRIMIN AF) 2 % POWDER    Apply topically as needed for itching (use beneath skin folds as needed).   MIRABEGRON ER (MYRBETRIQ) 50 MG TB24 TABLET    Take one tablet by mouth once daily for bladder   MULTIPLE VITAMINS-MINERALS (CENTRUM SILVER ULTRA WOMENS PO)    Take 1 tablet by mouth every morning.     NYSTATIN (MYCOSTATIN/NYSTOP) 100000 UNIT/GM POWD    2 times a day for 2 weeks in LT auxillia   OMEGA-3 ACID ETHYL ESTERS (LOVAZA) 1 G CAPSULE    TAKE (2) CAPSULES BY MOUTH TWICE DAILY.   OMEPRAZOLE (PRILOSEC) 20 MG CAPSULE     Take one capsule by mouth every day to reduce stomach acid secretion   POLYETHYLENE GLYCOL POWDER (GLYCOLAX/MIRALAX) POWDER    Take 17 g by mouth daily as needed for moderate constipation.   PRAMOX-PE-GLYCERIN-PETROLATUM (PREPARATION H) 1-0.25-14.4-15 % CREA    Apply to hemorrhoids as needed for pain or itching   PROBIOTIC PRODUCT (ALIGN) 4 MG CAPS  Take 1 capsule by mouth daily.   RIVASTIGMINE (EXELON) 4.6 MG/24HR    Place 1 patch (4.6 mg total) onto the skin daily.   ROSUVASTATIN (CRESTOR) 10 MG TABLET    Take 1 tablet (10 mg total) by mouth at bedtime.   TDAP (BOOSTRIX) 5-2.5-18.5 LF-MCG/0.5 INJECTION    Inject 0.5 mLs into the muscle once.   WHEAT DEXTRIN (BENEFIBER) POWD    Take by mouth. Use one capful with liquid daily for constipation   ZOSTER VACCINE LIVE, PF, (ZOSTAVAX) [address] UNT/0.65ML INJECTION    Inject 19,400 Units into the skin once.  Modified Medications   No medications on file  Discontinued Medications   No medications on file     Physical Exam:  Filed Vitals:   03/13/15 1131  BP: 140/82    Physical Exam  Constitutional: She appears well-developed. She appears lethargic. No distress.  HENT:  Mouth/Throat: Oropharynx is clear and moist. No oropharyngeal exudate.  Neck: Normal range of motion. Neck supple. No JVD present.  Cardiovascular: Normal rate, regular rhythm, normal heart sounds and intact distal pulses.   Pulmonary/Chest: Effort normal and breath sounds normal. She has no wheezes. She has no rales.  Abdominal: Soft. Bowel sounds are normal. She exhibits no distension and no mass. There is no tenderness.  Musculoskeletal: She exhibits edema (trace). She exhibits no tenderness.  Neurological: She appears lethargic.  Oriented to person only  Skin: Skin is warm and dry.  Psychiatric: She has a normal mood and affect.    Labs reviewed: Basic Metabolic Panel:  Recent Labs  60/45/40 0443 06/13/14 0400 07/27/14 1559  NA 141 143 142  K 3.5* 3.4* 3.9    CL 96 98 99  CO2 28 33* 28  GLUCOSE 128* 110* 114*  BUN 21 25* 16  CREATININE 0.73 0.82 0.94  CALCIUM 9.3 9.2 9.2   Liver Function Tests:  Recent Labs  06/08/14 1642  AST 28  ALT 12  ALKPHOS 52  BILITOT 1.1  PROT 7.5  ALBUMIN 3.2*   No results for input(s): LIPASE, AMYLASE in the last 8760 hours. No results for input(s): AMMONIA in the last 8760 hours. CBC:  Recent Labs  06/08/14 1642 06/09/14 0530  WBC 9.5 9.0  NEUTROABS 7.5  --   HGB 13.8 12.6  HCT 40.9 38.3  MCV 98.3 98.7  PLT 177 150   Lipid Panel: No results for input(s): CHOL, HDL, LDLCALC, TRIG, CHOLHDL, LDLDIRECT in the last 8760 hours. TSH: No results for input(s): TSH in the last 8760 hours. A1C: No results found for: HGBA1C   Assessment/Plan  1. Chronic diastolic heart failure -has needed 2 additional doses of lasix in the last 2 weeks, weight has been stable over the last 3 days. Oxygen saturation reviewed and stable over last few visits. Without shortness of breath during visit but increased fatigue -will get blood work today, to cont daily weights for now -will get chest xray  - Brain Natriuretic Peptide  2. Edema -no significant edema, not wearing compression hose today, daughter reports she plans to get some consume made.   3. Gastroesophageal reflux disease without esophagitis -will stop omeprazole and start Protonix to see if this helps symptom - pantoprazole (PROTONIX) 40 MG tablet; Take 1 tablet (40 mg total) by mouth daily.  Dispense: 30 tablet; Refill: 3  4. Malaise and fatigue -reports this comes and goes, worse when she is not feeling well which has been a current complaint, will evaluate labs and chest xray, no  fevers noted but on around the clock tylenol. conts to eat and drink well despite not feeling good. - Basic metabolic panel - CBC with Differential - DG Chest 2 View  5. Vaginal discharged Noted once, does not wish to have vaginal exam today, will monitor for now and if  reoccurs to follow up

## 2015-03-14 LAB — BASIC METABOLIC PANEL
BUN/Creatinine Ratio: 17 (ref 11–26)
BUN: 15 mg/dL (ref 8–27)
CALCIUM: 9.2 mg/dL (ref 8.7–10.3)
CO2: 27 mmol/L (ref 18–29)
CREATININE: 0.87 mg/dL (ref 0.57–1.00)
Chloride: 104 mmol/L (ref 97–108)
GFR, EST AFRICAN AMERICAN: 70 mL/min/{1.73_m2} (ref >59)
GFR, EST NON AFRICAN AMERICAN: 61 mL/min/{1.73_m2} (ref >59)
Glucose: 96 mg/dL (ref 65–99)
POTASSIUM: 4.2 mmol/L (ref 3.5–5.2)
Sodium: 145 mmol/L — ABNORMAL HIGH (ref 134–144)

## 2015-03-14 LAB — CBC WITH DIFFERENTIAL/PLATELET
BASOS ABS: 0 10*3/uL (ref 0.0–0.2)
Basos: 1 %
EOS (ABSOLUTE): 0.2 10*3/uL (ref 0.0–0.4)
Eos: 3 %
HEMATOCRIT: 34.4 % (ref 34.0–46.6)
Hemoglobin: 11.4 g/dL (ref 11.1–15.9)
IMMATURE GRANS (ABS): 0 10*3/uL (ref 0.0–0.1)
Immature Granulocytes: 0 %
Lymphocytes Absolute: 1.4 10*3/uL (ref 0.7–3.1)
Lymphs: 21 %
MCH: 31.6 pg (ref 26.6–33.0)
MCHC: 33.1 g/dL (ref 31.5–35.7)
MCV: 95 fL (ref 79–97)
Monocytes Absolute: 0.7 10*3/uL (ref 0.1–0.9)
Monocytes: 11 %
NEUTROS ABS: 4.1 10*3/uL (ref 1.4–7.0)
Neutrophils: 64 %
PLATELETS: 229 10*3/uL (ref 150–379)
RBC: 3.61 x10E6/uL — ABNORMAL LOW (ref 3.77–5.28)
RDW: 12.4 % (ref 12.3–15.4)
WBC: 6.4 10*3/uL (ref 3.4–10.8)

## 2015-03-14 LAB — BRAIN NATRIURETIC PEPTIDE: BNP: 262.4 pg/mL — AB (ref 0.0–100.0)

## 2015-03-19 ENCOUNTER — Encounter: Payer: Self-pay | Admitting: *Deleted

## 2015-03-19 ENCOUNTER — Telehealth: Payer: Self-pay | Admitting: *Deleted

## 2015-03-19 NOTE — Telephone Encounter (Signed)
Patient daughter is wanting the results from patient's chest x-ray you had ordered. Patient is still coughing and has actually gotten worse. More bronchitis like. Patient does have a Nebulizer. Please Advise.

## 2015-03-20 NOTE — Telephone Encounter (Signed)
Patient daughter stated that she will call if anymore concerns.

## 2015-03-20 NOTE — Telephone Encounter (Signed)
Results were called to daughter by sharon, Tiny bit of fluid on the lungs in the bases, so we recommended-- to have daughter increase lasix to twice daily for 3 days then resume previous dosing also to encourage foods high in potassium to supplement potassium

## 2015-04-03 IMAGING — CR DG CHEST 2V
3 series · 3 of 3 positions shown · non-contrast
Comparison: Chest radiograph 09/23/2013

CLINICAL DATA: Hypertension.

EXAM:
CHEST  2 VIEW

[w chest lat (1 of 2)]
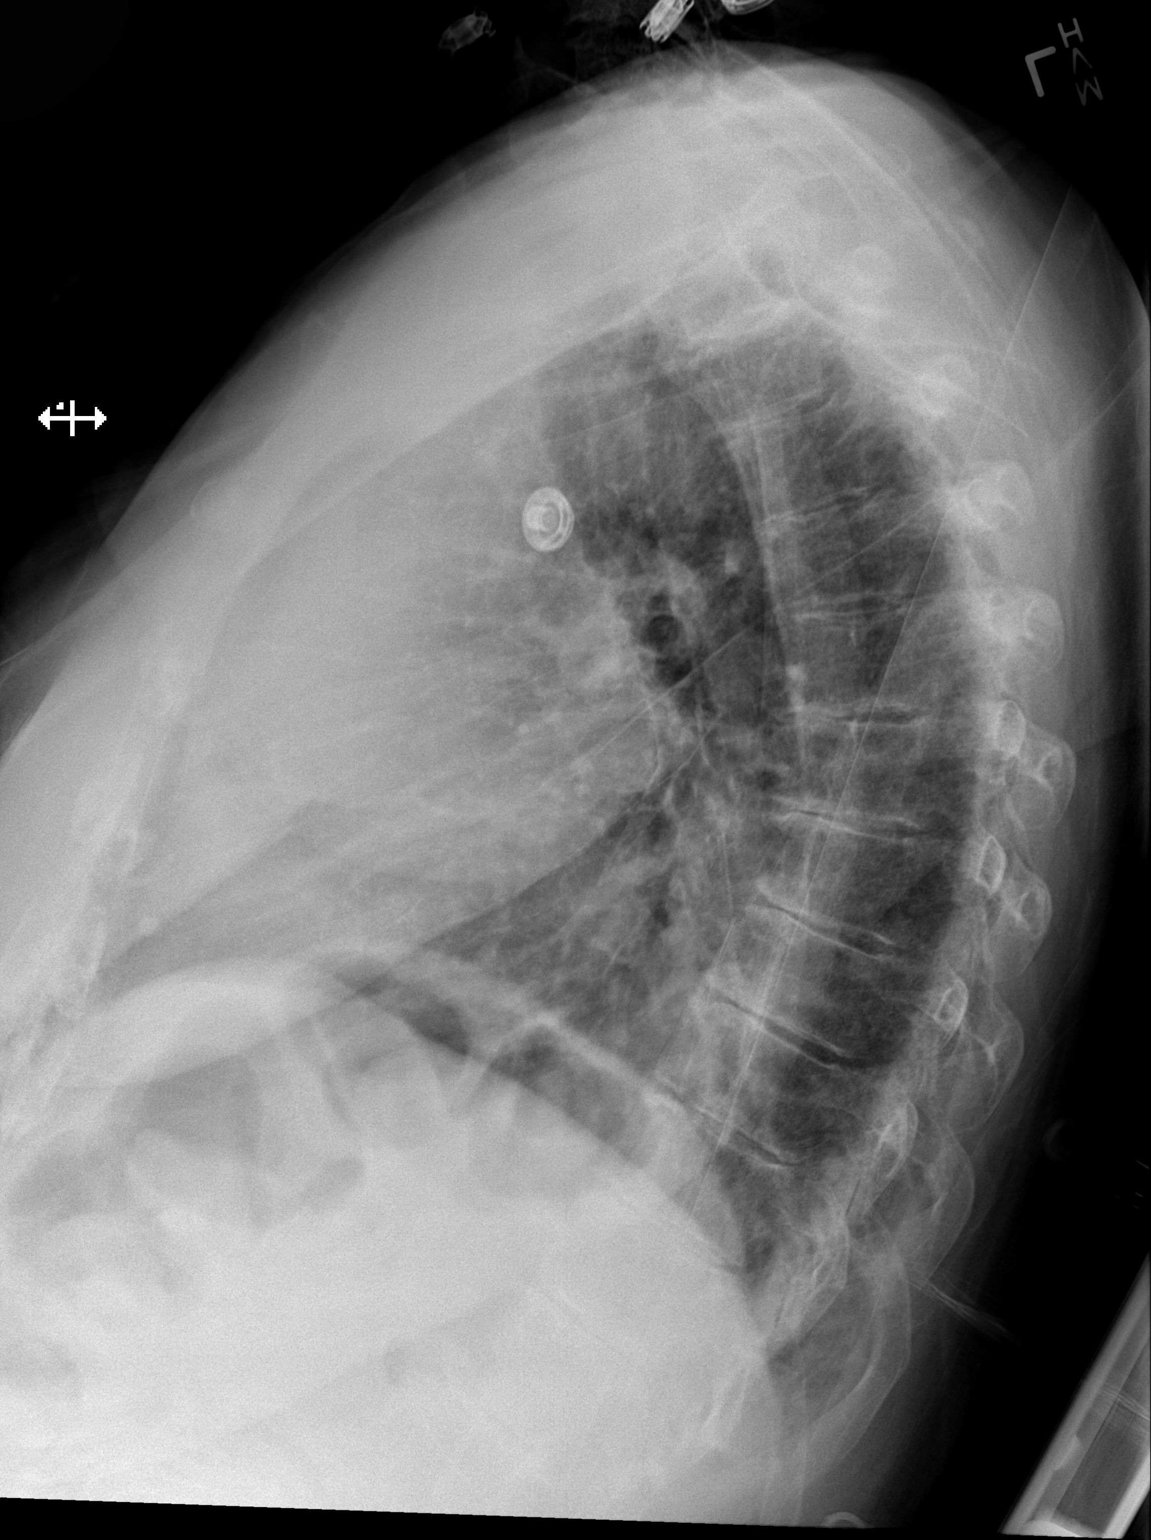

[w chest lat (2 of 2)]
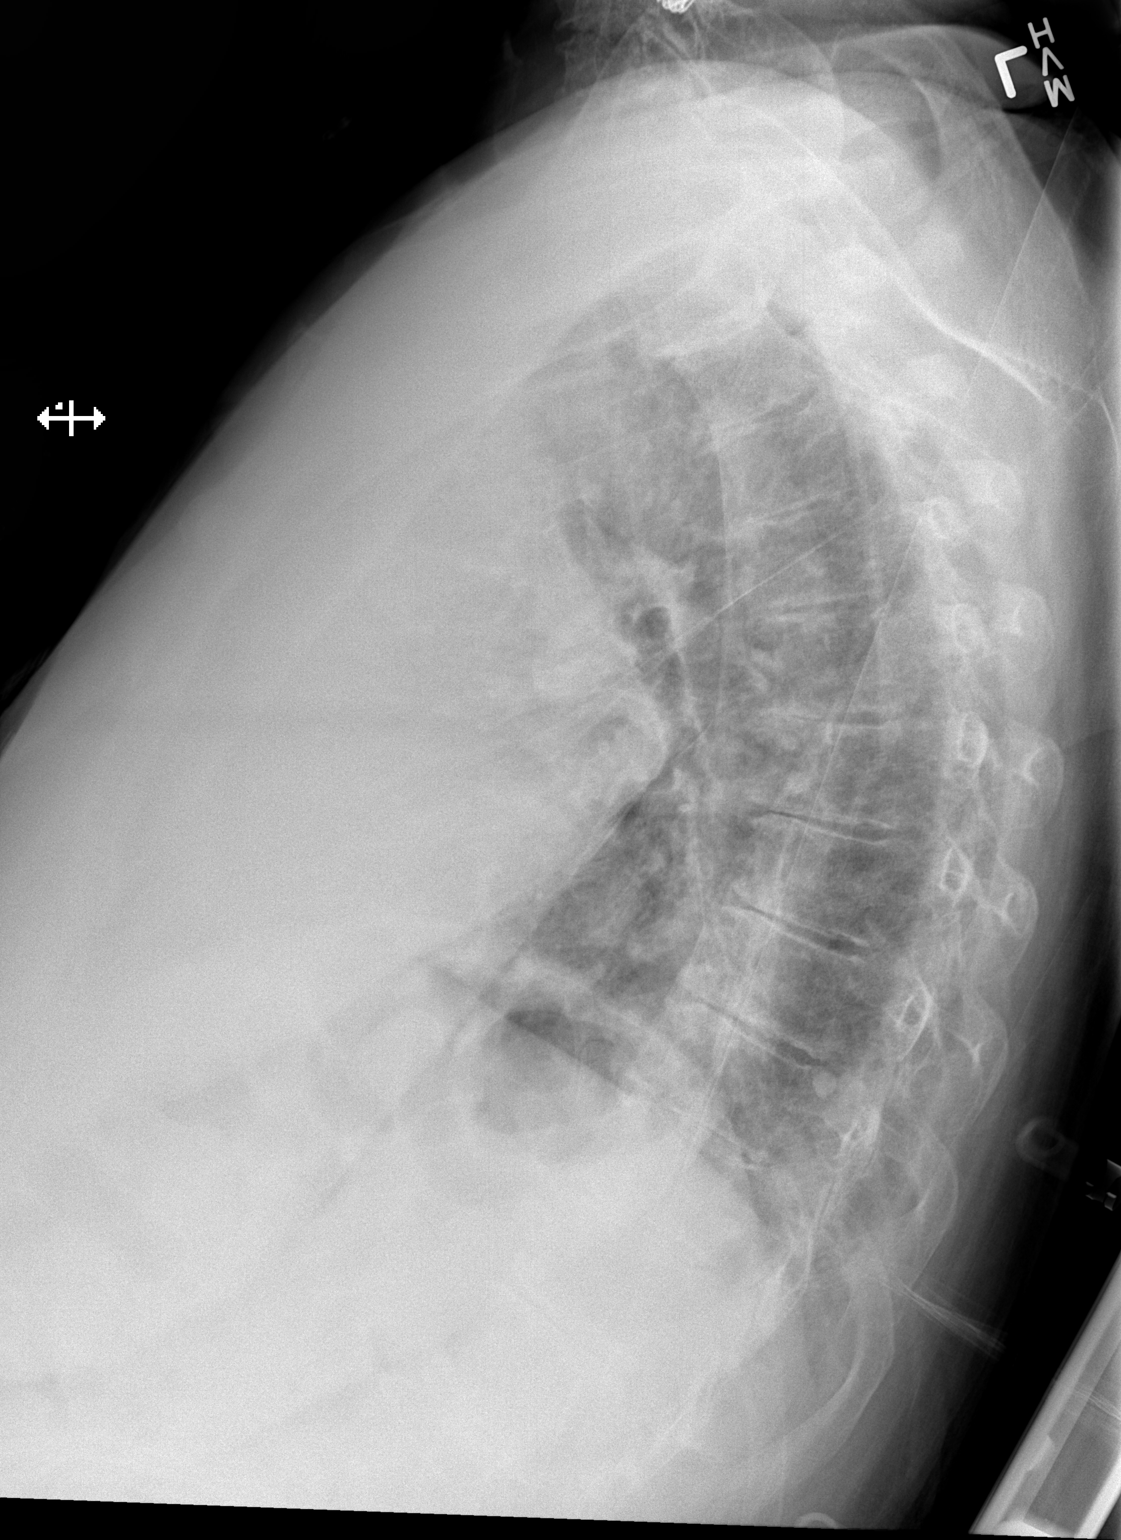

[x chest ap]
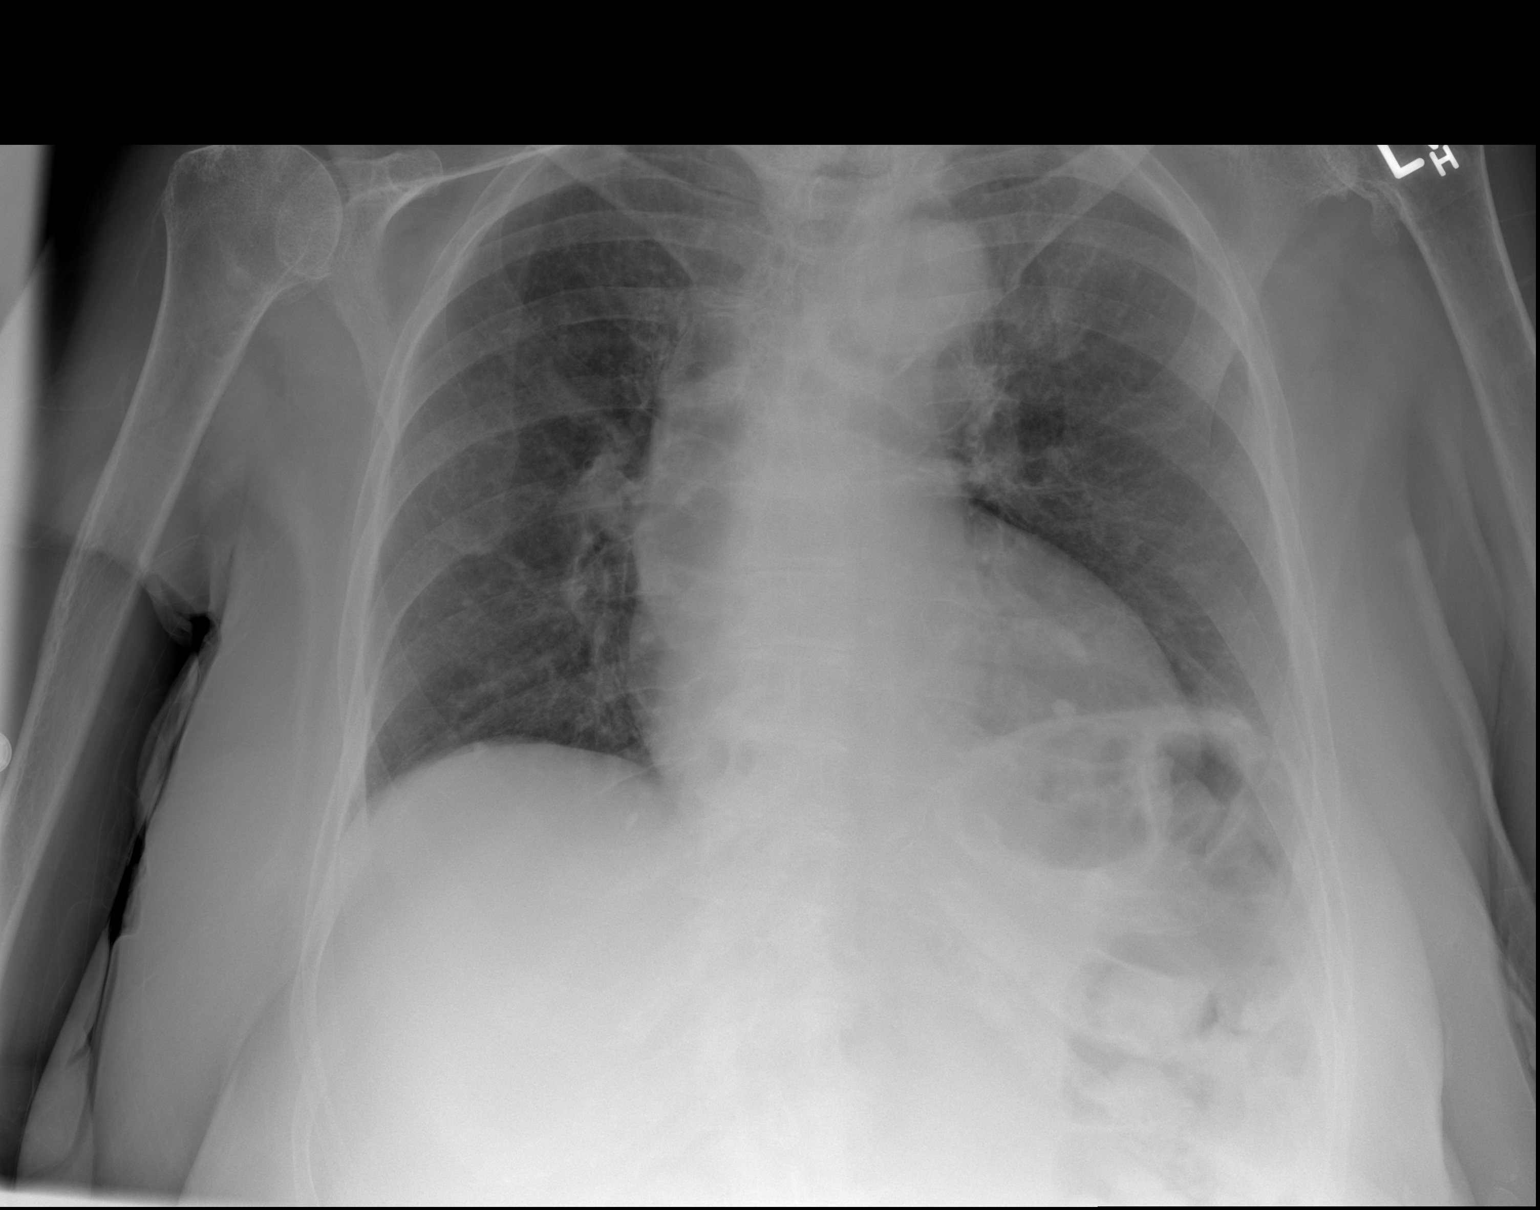

[3 of 3 positions shown; findings below may reference images not displayed]

FINDINGS: Stable cardiac contours. Marked tortuosity of the thoracic aorta.
Calcified granuloma left lower lung. No large consolidative
pulmonary opacity. No pleural effusion or pneumothorax. Mid thoracic
spine degenerative change.
IMPRESSION: No acute cardiopulmonary process.

## 2015-04-03 IMAGING — CT CT HEAD W/O CM
2 series · 17 of 30 positions shown, 20 images · non-contrast
Comparison: 10/18/2011

CLINICAL DATA: severe Headache

EXAM:
CT HEAD WITHOUT CONTRAST
TECHNIQUE: Contiguous axial images were obtained from the base of the skull
through the vertex without intravenous contrast.

[Series 2: head w/o · axial · non-contrast · 0.42mm/px · z∈[+1350,+1485]mm · 9 of 35 slices shown, 12 images]
[im 4/35  brain]
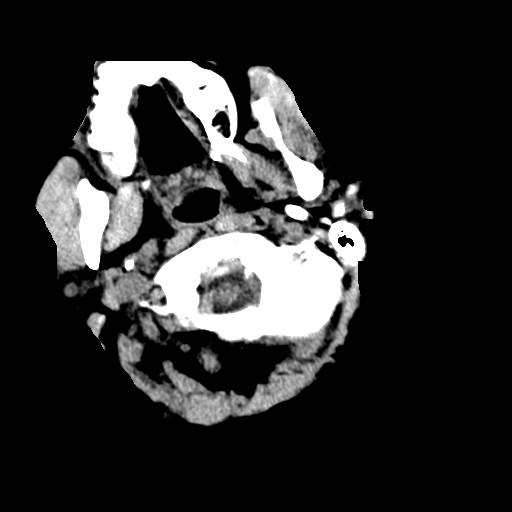
[im 4/35  bone]
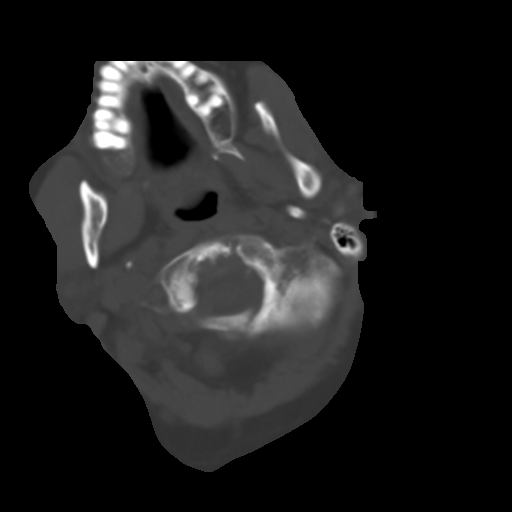
[im 7/35  brain]
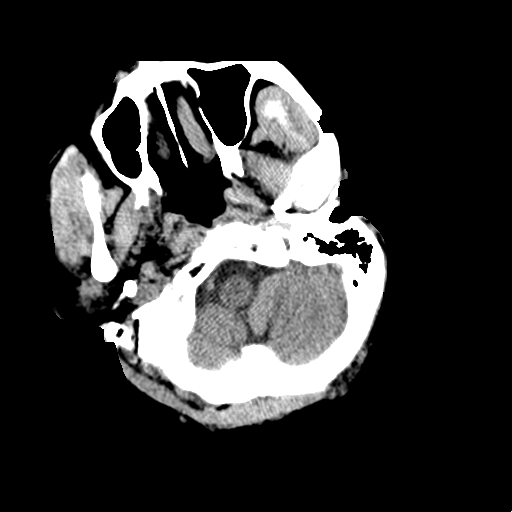
[im 11/35  brain]
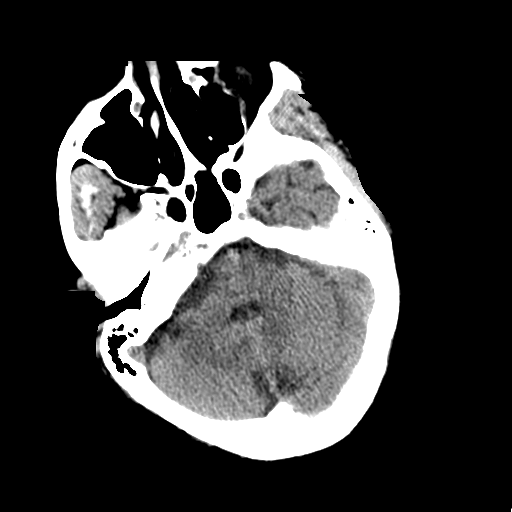
[im 14/35  brain]
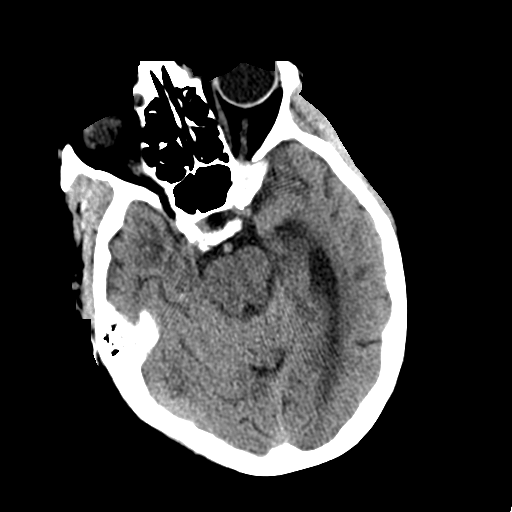
[im 18/35  brain]
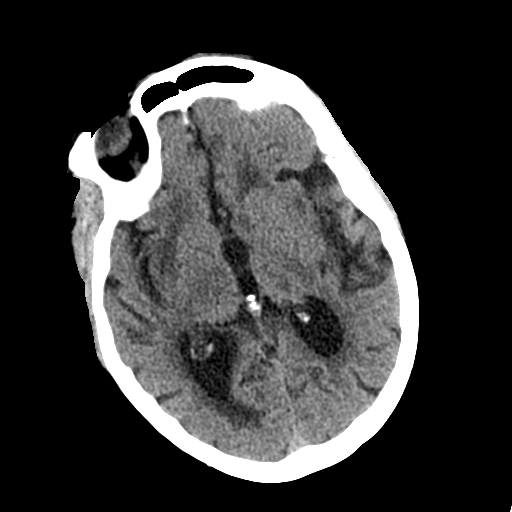
[im 18/35  bone]
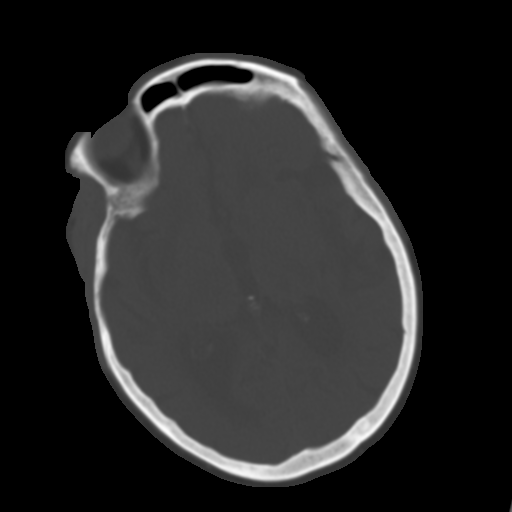
[im 21/35  brain]
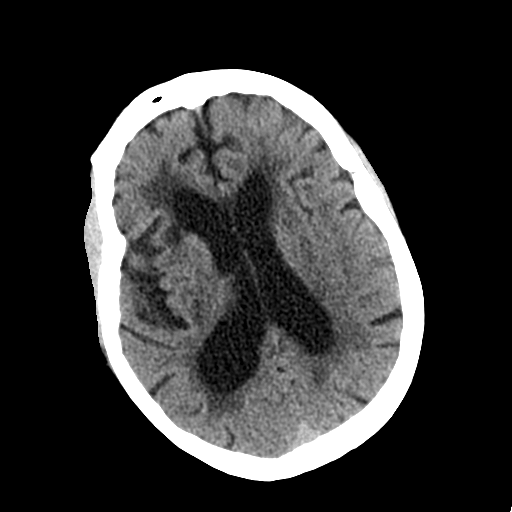
[im 24/35  brain]
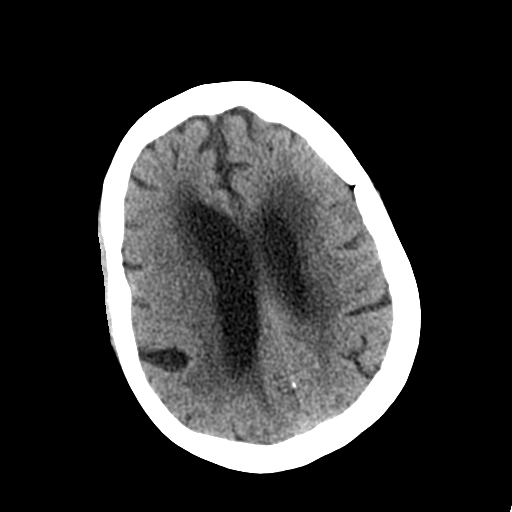
[im 28/35  brain]
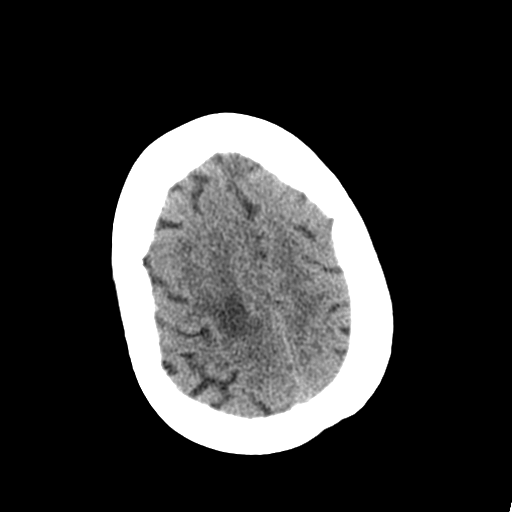
[im 31/35  brain]
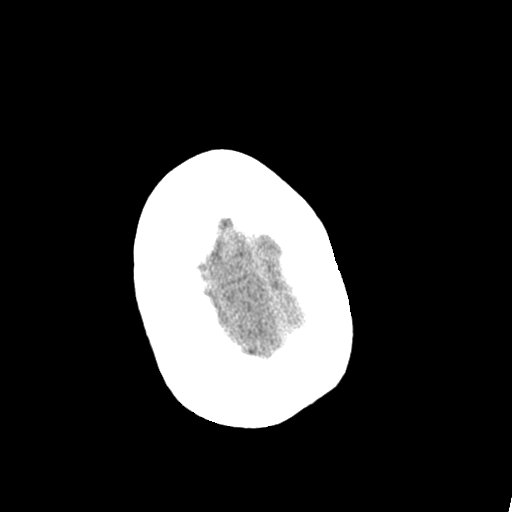
[im 31/35  bone]
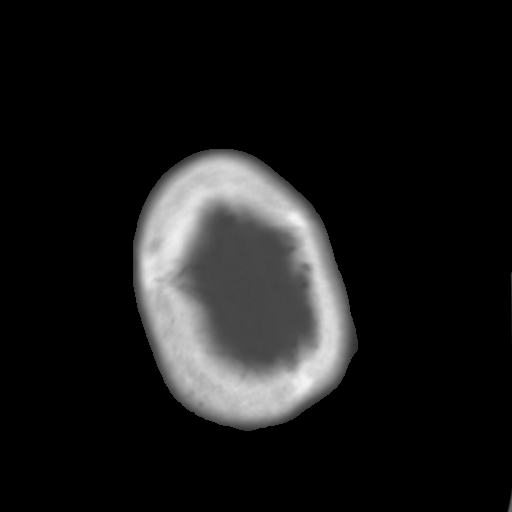

[Series 3: bone windows · axial · 0.42mm/px · z∈[+1353,+1485]mm · 8 of 58 slices shown]
[im 7/58  bone]
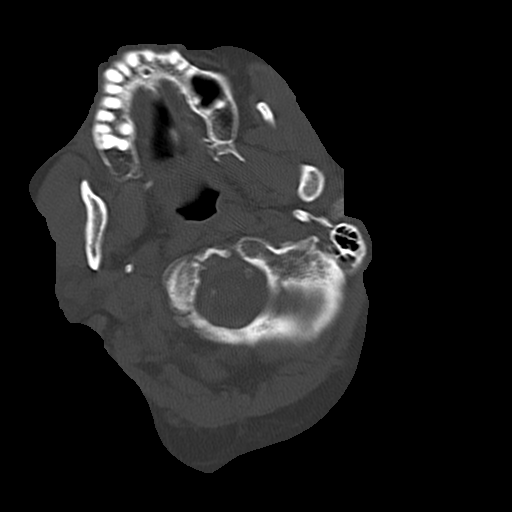
[im 13/58  bone]
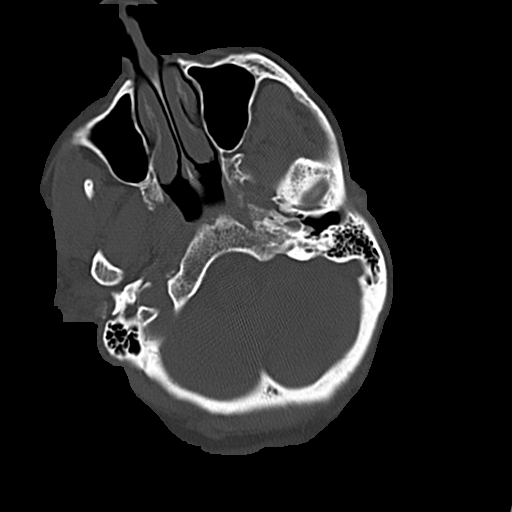
[im 20/58  bone]
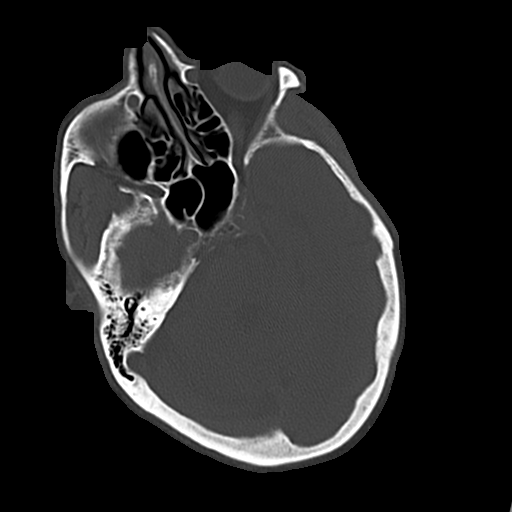
[im 26/58  bone]
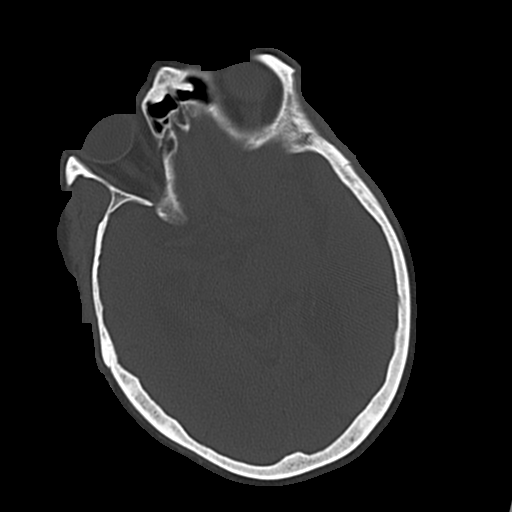
[im 32/58  bone]
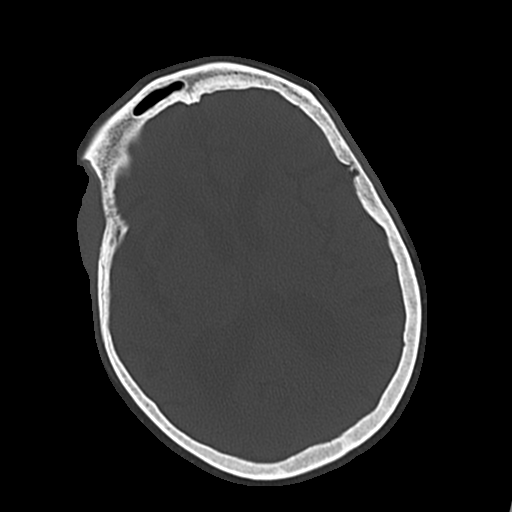
[im 39/58  bone]
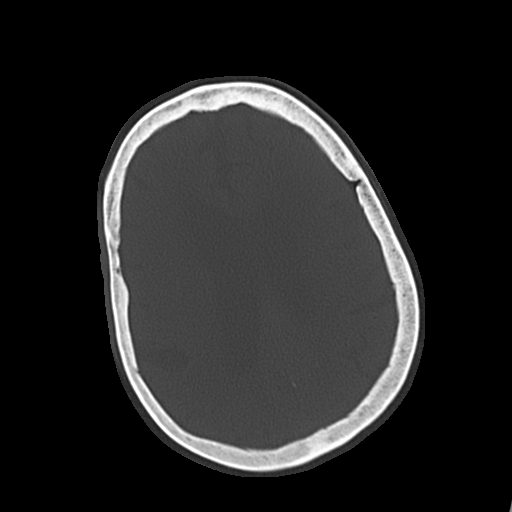
[im 45/58  bone]
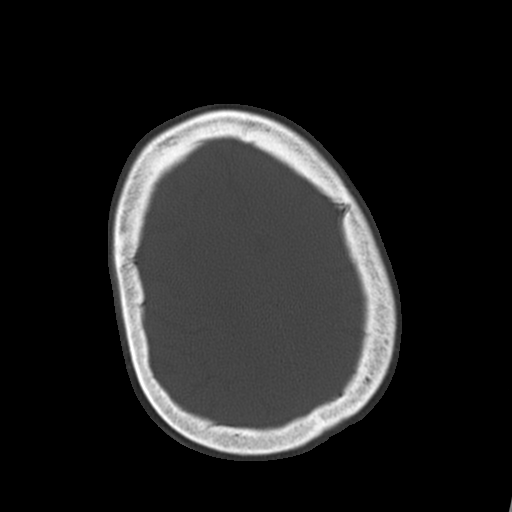
[im 51/58  bone]
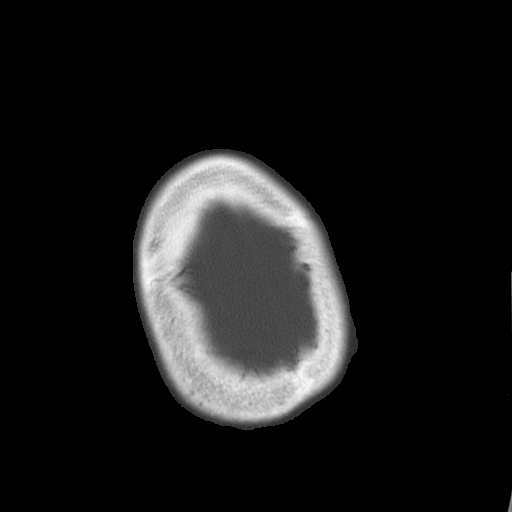

[17 of 30 positions shown; findings below may reference images not displayed]

FINDINGS: Retention cyst or polyp in the left maxillary sinus. Atherosclerotic
and physiologic intracranial calcifications. Diffuse parenchymal
atrophy. Patchy areas of hypoattenuation in deep and periventricular
white matter bilaterally. Negative for acute intracranial
hemorrhage, mass lesion, acute infarction, midline shift, or
mass-effect. Acute infarct may be inapparent on noncontrast CT.
Ventricles and sulci symmetric. Bone windows demonstrate no focal
lesion.
IMPRESSION: 1. Negative for bleed or other acute intracranial process.
2. Stable atrophy and nonspecific white matter changes.

## 2015-04-18 ENCOUNTER — Other Ambulatory Visit: Payer: Self-pay | Admitting: Internal Medicine

## 2015-04-23 ENCOUNTER — Other Ambulatory Visit: Payer: Self-pay | Admitting: *Deleted

## 2015-04-23 MED ORDER — OMEGA-3-ACID ETHYL ESTERS 1 G PO CAPS: ORAL_CAPSULE | ORAL | Status: DC

## 2015-04-23 NOTE — Telephone Encounter (Signed)
Patient daughter requested [facility]

## 2015-05-08 DIAGNOSIS — I951 Orthostatic hypotension: Secondary | ICD-10-CM | POA: Diagnosis not present

## 2015-05-08 DIAGNOSIS — I2510 Atherosclerotic heart disease of native coronary artery without angina pectoris: Secondary | ICD-10-CM | POA: Diagnosis not present

## 2015-05-08 DIAGNOSIS — M810 Age-related osteoporosis without current pathological fracture: Secondary | ICD-10-CM | POA: Diagnosis not present

## 2015-05-08 DIAGNOSIS — I509 Heart failure, unspecified: Secondary | ICD-10-CM | POA: Diagnosis not present

## 2015-05-08 DIAGNOSIS — I10 Essential (primary) hypertension: Secondary | ICD-10-CM | POA: Diagnosis not present

## 2015-05-08 DIAGNOSIS — Z7902 Long term (current) use of antithrombotics/antiplatelets: Secondary | ICD-10-CM | POA: Diagnosis not present

## 2015-05-08 DIAGNOSIS — F319 Bipolar disorder, unspecified: Secondary | ICD-10-CM | POA: Diagnosis not present

## 2015-05-08 DIAGNOSIS — Z9181 History of falling: Secondary | ICD-10-CM | POA: Diagnosis not present

## 2015-05-08 DIAGNOSIS — F0281 Dementia in other diseases classified elsewhere with behavioral disturbance: Secondary | ICD-10-CM | POA: Diagnosis not present

## 2015-05-08 DIAGNOSIS — G20 Parkinson's disease: Secondary | ICD-10-CM | POA: Diagnosis not present

## 2015-05-14 ENCOUNTER — Encounter: Payer: Self-pay | Admitting: Internal Medicine

## 2015-05-14 ENCOUNTER — Ambulatory Visit (INDEPENDENT_AMBULATORY_CARE_PROVIDER_SITE_OTHER): Payer: Medicare Other | Admitting: Internal Medicine

## 2015-05-14 VITALS — BP 130/90 | HR 74 | Temp 98.1°F | Resp 20 | Ht 62.0 in | Wt 188.8 lb

## 2015-05-14 DIAGNOSIS — K219 Gastro-esophageal reflux disease without esophagitis: Secondary | ICD-10-CM

## 2015-05-14 DIAGNOSIS — E669 Obesity, unspecified: Secondary | ICD-10-CM

## 2015-05-14 DIAGNOSIS — K5901 Slow transit constipation: Secondary | ICD-10-CM | POA: Diagnosis not present

## 2015-05-14 DIAGNOSIS — G20 Parkinson's disease: Secondary | ICD-10-CM

## 2015-05-14 DIAGNOSIS — Z9181 History of falling: Secondary | ICD-10-CM

## 2015-05-14 DIAGNOSIS — R509 Fever, unspecified: Secondary | ICD-10-CM | POA: Diagnosis not present

## 2015-05-14 DIAGNOSIS — N3941 Urge incontinence: Secondary | ICD-10-CM

## 2015-05-14 DIAGNOSIS — Z23 Encounter for immunization: Secondary | ICD-10-CM | POA: Diagnosis not present

## 2015-05-14 DIAGNOSIS — I5032 Chronic diastolic (congestive) heart failure: Secondary | ICD-10-CM

## 2015-05-14 DIAGNOSIS — S41112A Laceration without foreign body of left upper arm, initial encounter: Secondary | ICD-10-CM

## 2015-05-14 DIAGNOSIS — F0280 Dementia in other diseases classified elsewhere without behavioral disturbance: Secondary | ICD-10-CM

## 2015-05-14 DIAGNOSIS — G3183 Dementia with Lewy bodies: Secondary | ICD-10-CM

## 2015-05-14 MED ORDER — OMEPRAZOLE 20 MG PO CPDR: 20.0000 mg | DELAYED_RELEASE_CAPSULE | Freq: Every day | ORAL | Status: DC

## 2015-05-14 NOTE — Progress Notes (Signed)
Patient ID: Holly Grimes, female   DOB: [birth year], 81 y.o.   MRN: [id number]   Location:  [facility]  Code Status: DNR Goals of Care: Advanced Directive information Does patient have an advance directive?: Yes, Type of Advance Directive: Healthcare Power of [place];Living will;Out of facility DNR (pink MOST or yellow form), Pre-existing out of facility DNR order (yellow form or pink MOST form): Yellow form placed in chart (order not valid for inpatient use), Does patient want to make changes to advanced directive?: No - Patient declined   Chief Complaint  Patient presents with  . Medical Management of Chronic Issues    3 month follow-up, fell this morning has laceration on left arm , copy of labs printed lab             HPI: Patient is a 81 y.o. white female seen in the office today for med mgt of chronic diseases  Had a fall at [place] this am.  She was hamming it up when it was time to get her weight.  She was very energetic overnight.  Turned walker sideways, slid down into seated position with her caregiver holding her.  She got a left arm laceration on her medial upper arm on the way down.  Unclear what she hit it on.    Has 24 hr caregiver.  Great programs and transportation.  Has a larger apt now.  Is participating in activities.  They eat too many desserts and ice cream and popcorn.   Urge incontinence. More manicky that usually.  Sleeping through the night and wetting the bed.  Imagines smell of urine even at times when not incontinent.  Has been clear.  Continues on myrbetriq.  CHF:  No sob.  Wears compression hose and uses extra lasix prn.  Had one dose two weeks ago.  Weight steady since at 184 lbs.    Having yeast under left underarm.  Being treated with miconazole.    Intermittent constipation.  Not present at this time.  Had a large bm Saturday.    Temp has been 99-99.3 max.  Happening more in day than night.  Tylenol  been held some to see.  Sometimes still has low grade temp.  Has been having little bits of vomiting after meals even with the protonix.  Her daughter would like to try omeprazole 20mg  again which worked in the past.  Review of Systems:  Review of Systems  Constitutional: Positive for malaise/fatigue. Negative for chills.  HENT: Positive for hearing loss. Negative for congestion.   Eyes: Negative for blurred vision.       Glasses  Respiratory: Negative for shortness of breath.   Cardiovascular: Positive for leg swelling. Negative for chest pain.  Gastrointestinal: Positive for heartburn, vomiting and constipation. Negative for nausea, abdominal pain, diarrhea, blood in stool and melena.       Intermittent constipation responds to treatment; see hpi  Genitourinary: Positive for urgency and frequency. Negative for dysuria and hematuria.       Has chronic OAB and incontinence--also functional  Musculoskeletal: Positive for falls.  Skin: Positive for rash.       Yeast rash under left axilla  Neurological: Positive for weakness. Negative for dizziness and loss of consciousness.  Endo/Heme/Allergies: Bruises/bleeds easily.  Psychiatric/Behavioral: Positive for depression, hallucinations and memory loss. Negative for suicidal ideas.       Depression well controlled--has always seemed very happy the past few years  Past Medical History  Diagnosis Date  . Hypertension   . High cholesterol   . Mitral valve prolapse   . Dementia   . Parkinson's disease, Lewy body   . Unspecified constipation   . Onychia and paronychia of toe   . Spasm of muscle   . Abnormality of gait   . Orthostatic hypotension   . Major depressive disorder, single episode, unspecified   . Diaphragmatic hernia without mention of obstruction or gangrene   . Alzheimer's disease   . Osteoarthrosis, unspecified whether generalized or localized, unspecified site   . Osteoporosis, unspecified   . Unspecified constipation    . Sprain of ribs   . Delirium due to conditions classified elsewhere   . Diarrhea   . Edema   . Pain in joint, pelvic region and thigh   . Other malaise and fatigue   . Urinary frequency [id number]  . Personal history of fall   . Female stress incontinence   . Other and unspecified hyperlipidemia   . Obesity, unspecified   . Osteoarthrosis, unspecified whether generalized or localized, unspecified site   . Pain in joint, shoulder region   . Transient ischemic attack (TIA), and cerebral infarction without residual deficits(V12.54)   . Risk for falls     Past Surgical History  Procedure Laterality Date  . Joint replacement    . Tonsillectomy    . Ankle surgery Left 1998  . Total knee arthroplasty Right 2002  . Total knee arthroplasty Left 2005  . Hip arthroplasty Right 09/24/2013    Procedure: ARTHROPLASTY BIPOLAR HIP;  Surgeon: Loanne Drilling, MD;  Location: [facility];  Service: Orthopedics;  Laterality: Right;    Allergies  Allergen Reactions  . Cephalexin Nausea And Vomiting  . Sulfa Antibiotics Other (See Comments)    Daughter unsure of reaction, but believes it is severe  . Sulfites     unknown   Medications: Patient's Medications  New Prescriptions   No medications on file  Previous Medications   ACETAMINOPHEN (TYLENOL) 650 MG CR TABLET    Take (2) 650 mg tablets by mouth BID (8 hours formulation)   ALBUTEROL (ACCUNEB) 1.25 MG/3ML NEBULIZER SOLUTION    Take 3 mLs (1.25 mg total) by nebulization every 6 (six) hours as needed for wheezing.   AMBULATORY NON FORMULARY MEDICATION    1. High Low Hospital Bed to allow for safe tranfers 2. Scoop Mattress 3. Bed Alarm and Chair Alarm-to reduce fall risk 4. (2) Halo Bed Rails 5. Fall Mat Dx: G30.9, I95.1, G20, Z91.81   ASCORBIC ACID (VITAMIN C WITH ROSE HIPS) 1000 MG TABLET    Take 1,000 mg by mouth daily.     CALCIUM CITRATE-VITAMIN D 500-400 MG-UNIT CHEWABLE TABLET    Chew 1 tablet by mouth 2 (two) times daily.    CARVEDILOL (COREG) 6.25 MG TABLET    Take 1 tablet (6.25 mg total) by mouth 2 (two) times daily with a meal.   CHOLECALCIFEROL (VITAMIN D3) 2000 UNITS TABS    Take 2,000 Units by mouth daily.   CITALOPRAM (CELEXA) 20 MG TABLET    Take 1 tablet (20 mg total) by mouth daily.   CLOPIDOGREL (PLAVIX) 75 MG TABLET    Take one tablet by mouth once daily to help circulation   DOCUSATE SODIUM (COLACE) 100 MG CAPSULE    Take 1 capsule (100 mg total) by mouth 2 (two) times daily.   FEEDING SUPPLEMENT (BOOST HIGH PROTEIN) LIQD    Take 1 Container  by mouth as needed.    FLUTICASONE (CUTIVATE) 0.05 % CREAM    Apply 1 application topically 2 (two) times daily. To affected areas as needed for skin irritaion   FUROSEMIDE (LASIX) 20 MG TABLET    Take one tablet by mouth once daily unless weight gain of 3 pounds in one day or 5 pounds in one week  take two tablets by mouth as needed for swelling   GUAIFENESIN (MUCINEX MAXIMUM STRENGTH) 1200 MG TB12    Take by mouth daily.    HALOPERIDOL (HALDOL) 0.5 MG TABLET    Take one tablet by mouth once daily as needed for agitation/psychosis   MELATONIN 1 MG/4ML LIQD    Take 1 mL (0.25 mg total) by mouth at bedtime as needed (insomnia).   MEMANTINE (NAMENDA XR) 28 MG CP24 24 HR CAPSULE    Take one capsule by mouth once daily for memory   MICONAZOLE (LOTRIMIN AF) 2 % POWDER    Apply topically as needed for itching (use beneath skin folds as needed).   MIRABEGRON ER (MYRBETRIQ) 50 MG TB24 TABLET    Take one tablet by mouth once daily for bladder   MULTIPLE VITAMINS-MINERALS (CENTRUM SILVER ULTRA WOMENS PO)    Take 1 tablet by mouth every morning.     NYSTATIN (MYCOSTATIN/NYSTOP) 100000 UNIT/GM POWD    2 times a day for 2 weeks in LT auxillia   OMEGA-3 ACID ETHYL ESTERS (LOVAZA) 1 G CAPSULE    Take two capsules by mouth twice daily for cholesterol   PANTOPRAZOLE (PROTONIX) 40 MG TABLET    Take 1 tablet (40 mg total) by mouth daily.   POLYETHYLENE GLYCOL POWDER (GLYCOLAX/MIRALAX)  POWDER    Take 17 g by mouth daily as needed for moderate constipation.   PRAMOX-PE-GLYCERIN-PETROLATUM (PREPARATION H) 1-0.25-14.4-15 % CREA    Apply to hemorrhoids as needed for pain or itching   PROBIOTIC PRODUCT (ALIGN) 4 MG CAPS    Take 1 capsule by mouth daily.   RIVASTIGMINE (EXELON) 4.6 MG/24HR    Place 1 patch (4.6 mg total) onto the skin daily.   ROSUVASTATIN (CRESTOR) 10 MG TABLET    Take 1 tablet (10 mg total) by mouth at bedtime.   TDAP (BOOSTRIX) 5-2.5-18.5 LF-MCG/0.5 INJECTION    Inject 0.5 mLs into the muscle once.   WHEAT DEXTRIN (BENEFIBER) POWD    Take by mouth. Use one capful with liquid daily for constipation   ZOSTER VACCINE LIVE, PF, (ZOSTAVAX) 16109 UNT/0.65ML INJECTION    Inject 19,400 Units into the skin once.  Modified Medications   No medications on file  Discontinued Medications   No medications on file    Physical Exam: Filed Vitals:   05/14/15 1442  BP: 130/90  Pulse: 74  Temp: 98.1 F (36.7 C)  TempSrc: Oral  Resp: 20  Height: 5\' 2"  (1.575 m)  Weight: 188 lb 12.8 oz (85.639 kg)  SpO2: 95%   Physical Exam  Constitutional: She is oriented to person, place, and time. No distress.  Overweight white female seated in wheelchair, NAD  HENT:  Head: Normocephalic and atraumatic.  Cardiovascular: Normal rate, regular rhythm, normal heart sounds and intact distal pulses.   Pulmonary/Chest: Effort normal and breath sounds normal. No respiratory distress.  Abdominal: Soft. Bowel sounds are normal. She exhibits no distension. There is no tenderness.  Neurological: She is alert and oriented to person, place, and time.  Skin: Skin is warm and dry.  Left axilla with mild erythema present  Psychiatric:  Very pleasant, smiles, jokes; does not know answers to questions about her condition due to memory loss    Labs reviewed: Basic Metabolic Panel:  Recent Labs  40/98/11 0400 07/27/14 1559 03/13/15 1216  NA 143 142 145*  K 3.4* 3.9 4.2  CL 98 99 104    CO2 33* 28 27  GLUCOSE 110* 114* 96  BUN 25* 16 15  CREATININE 0.82 0.94 0.87  CALCIUM 9.2 9.2 9.2   Liver Function Tests:  Recent Labs  06/08/14 1642  AST 28  ALT 12  ALKPHOS 52  BILITOT 1.1  PROT 7.5  ALBUMIN 3.2*   No results for input(s): LIPASE, AMYLASE in the last 8760 hours. No results for input(s): AMMONIA in the last 8760 hours. CBC:  Recent Labs  06/08/14 1642 06/09/14 0530 03/13/15 1216  WBC 9.5 9.0 6.4  NEUTROABS 7.5  --  4.1  HGB 13.8 12.6  --   HCT 40.9 38.3 34.4  MCV 98.3 98.7  --   PLT 177 150  --    Lipid Panel: No results for input(s): CHOL, HDL, LDLCALC, TRIG, CHOLHDL, LDLDIRECT in the last 8760 hours. No results found for: HGBA1C  Assessment/Plan 1. Arm laceration, left, initial encounter - noted during fall -will need tdap booster today -cont fall precautions and close supervision with caregiver  2. Parkinson's disease, Lewy body -is progressing gradually, but appeared about the same during her visit today -incontinence worse, falling worse (really doesn't walk much)  3. Personal history of fall -cont use of walker when not in wheelchair and caregiver assistance with all transfers and ADLs  4. Chronic diastolic heart failure - seems stable at present -cont as needed lasix for weight gain and daily wt monitoring - Basic metabolic panel  5. Urge incontinence - has progressively worsened despite full dose mybetriq -Urine dipstick ordered to assess, but could not immediately be obtained--eventually was unremarkable - Basic metabolic panel  6. Slow transit constipation -cont current bowel regimen and close monitoring to make sure this is not causing the vomiting if she gets obstructed, but pt was in absolutely no distress when seen  7. Need for Tdap vaccination - given today due to laceration on her arm  8. Obesity - has lost some weight but still has high bmi -due to weight loss associated with advancing dementia, I have not  encouraged a special diet for her - CBC with Differential/Platelet - Basic metabolic panel  9. Low grade fever - not present during appt, but at home - POCT urinalysis dipstick done and ok  10. Gastroesophageal reflux disease without esophagitis - cont PPI--appears she got on protonix along with plavix and should be on nexium (must have had a forumulary change at [facility]?)--will need to make this change  Labs/tests ordered:   Orders Placed This Encounter  Procedures  . Tdap vaccine greater than or equal to 7yo IM  . CBC with Differential/Platelet  . Basic metabolic panel  . POCT urinalysis dipstick    Next appt:  3 mos for med mgt  Avyn Aden L. Reeshemah Nazaryan, D.O. Geriatrics [facility] [address], [place] [address] Cell Phone (Mon-Fri 8am-5pm):  [phone] On Call:  [phone] & follow prompts after 5pm & weekends Office Phone:  [phone] Office Fax:  [phone]

## 2015-05-15 LAB — CBC WITH DIFFERENTIAL/PLATELET
Basophils Absolute: 0 10*3/uL (ref 0.0–0.2)
Basos: 1 %
EOS (ABSOLUTE): 0.2 10*3/uL (ref 0.0–0.4)
Eos: 3 %
Hematocrit: 37.1 % (ref 34.0–46.6)
Hemoglobin: 12.3 g/dL (ref 11.1–15.9)
Immature Grans (Abs): 0 10*3/uL (ref 0.0–0.1)
Immature Granulocytes: 0 %
Lymphocytes Absolute: 1.7 10*3/uL (ref 0.7–3.1)
Lymphs: 29 %
MCH: 30.6 pg (ref 26.6–33.0)
MCHC: 33.2 g/dL (ref 31.5–35.7)
MCV: 92 fL (ref 79–97)
Monocytes Absolute: 0.7 10*3/uL (ref 0.1–0.9)
Monocytes: 12 %
Neutrophils Absolute: 3.2 10*3/uL (ref 1.4–7.0)
Neutrophils: 55 %
Platelets: 245 10*3/uL (ref 150–379)
RBC: 4.02 x10E6/uL (ref 3.77–5.28)
RDW: 13.7 % (ref 12.3–15.4)
WBC: 5.9 10*3/uL (ref 3.4–10.8)

## 2015-05-15 LAB — BASIC METABOLIC PANEL
BUN/Creatinine Ratio: 14 (ref 11–26)
BUN: 13 mg/dL (ref 8–27)
CO2: 25 mmol/L (ref 18–29)
Calcium: 9.4 mg/dL (ref 8.7–10.3)
Chloride: 97 mmol/L (ref 97–108)
Creatinine, Ser: 0.9 mg/dL (ref 0.57–1.00)
GFR calc Af Amer: 67 mL/min/{1.73_m2} (ref >59)
GFR calc non Af Amer: 58 mL/min/{1.73_m2} — ABNORMAL LOW (ref >59)
Glucose: 92 mg/dL (ref 65–99)
Potassium: 4.8 mmol/L (ref 3.5–5.2)
Sodium: 139 mmol/L (ref 134–144)

## 2015-05-15 LAB — POCT URINALYSIS DIPSTICK
Bilirubin, UA: NEGATIVE
Blood, UA: NEGATIVE
Glucose, UA: NEGATIVE
Ketones, UA: NEGATIVE
Nitrite, UA: NEGATIVE
Protein, UA: 30
Spec Grav, UA: 1.01
Urobilinogen, UA: 0.2
pH, UA: 7.5

## 2015-05-18 ENCOUNTER — Telehealth: Payer: Self-pay

## 2015-05-18 NOTE — Telephone Encounter (Signed)
Daughter Andree Coss left message on triage voice mail, hasn't gotten call about results, call her back on Monday [phone], she will be travling.

## 2015-05-19 NOTE — Telephone Encounter (Signed)
Apparently there was a glitch in the system b/c I only received the initial dipstick report which was negative.  All of her other labs were unremarkable.

## 2015-05-21 NOTE — Telephone Encounter (Signed)
Called daughter [name] with lab results, wants to let Dr. Renato Gails know that mother is so weak and fail, wants to know what to do? Message to Dr. Renato Gails

## 2015-05-22 ENCOUNTER — Telehealth: Payer: Self-pay | Admitting: *Deleted

## 2015-05-22 MED ORDER — ESOMEPRAZOLE MAGNESIUM 40 MG PO CPDR: DELAYED_RELEASE_CAPSULE | ORAL | Status: DC

## 2015-05-22 NOTE — Telephone Encounter (Signed)
Discontinue omeprazole and begin nexium  daily for GERD.  I did not catch that change in her PPI.  Sometimes it gets changed for facility formularies.  She has always had difficulty with her blood pressure dropping very low occasionally as part of her disease.  Is she eating and drinking normally?  Pushing fluids and making sure she stands slowly are the best interventions for this.

## 2015-05-22 NOTE — Telephone Encounter (Signed)
[name], daughter called and stated that her mother is not well and is frail. Stated that PT nurse was there today and had concerns with medications that patient is taking. She entered medication in system and there was a critical with Plavix and Omeprazole. Stated that together it was a critical alert stating that medication taken together the Plavix is ineffective to prevent heart attacks and strokes. Should they keep this combination or go back to taking the Plavix and Protonix? Please Advise. Also the PT nurse had concerns regarding patient's BP. Stated that when they took it this morning it was 70/30 and so they waited a little while and layed her down and retook the BP and it came up to 100/65. Stated that they are going to push more fluids and recheck the BP before medication and 2 hours after medicine given.

## 2015-05-22 NOTE — Telephone Encounter (Signed)
Spoke with [name] about Dr. Ernest Mallick message, when she returns to town, she'll make appt

## 2015-05-22 NOTE — Telephone Encounter (Signed)
She may be gradually declining as part of her progressive dementia.  Her labwork was unremarkable.  She did not appear different than usual to me at her appt.  If she would like to bring her back in, we can reassess her.

## 2015-05-22 NOTE — Telephone Encounter (Signed)
Holly Grimes, daughter notified and wanted the Rx faxed to [facility]. Faxed Rx and gave daughter their number.

## 2015-05-23 ENCOUNTER — Telehealth: Payer: Self-pay

## 2015-05-23 ENCOUNTER — Encounter: Payer: Self-pay | Admitting: *Deleted

## 2015-05-23 NOTE — Telephone Encounter (Signed)
Fax received from [employer] to initiate PA , nexium is not covered under plan. I called patient's daughter to confirm all the previous tried medication. Patient tried prilosec, omeprazole, protonix (interacts with plavix), tums and zegerid .  I called to initiate a PA, PA was already initiated by someone and was approved through 05/22/2016.   I called the pharmacy to inform them of PA approval status.

## 2015-05-23 NOTE — Telephone Encounter (Signed)
Patients daughter aware

## 2015-05-23 NOTE — Progress Notes (Signed)
Patient received a prior authorization dating from 03/24/2015 - 05/22/2016 for medication Nexium 40 mg. S Rice RMA

## 2015-05-26 ENCOUNTER — Encounter: Payer: Self-pay | Admitting: Internal Medicine

## 2015-05-29 ENCOUNTER — Telehealth: Payer: Self-pay | Admitting: *Deleted

## 2015-05-29 NOTE — Telephone Encounter (Signed)
Patient daughter, [name] called and stated that patient has slept all night and morning. Caregiver woke her up to take medications and BP and her BP was 184/106. Caregiver waited and took it again and it was the same. Caregiver did this four different times. No distress. Please Advise.

## 2015-05-29 NOTE — Telephone Encounter (Signed)
Called and spoke daughter, Holly Grimes, No Pain and just states that her heartrate was irregular. Kim went ahead and called the [employer] nurse line and within the half hour it went down to 153/86. Has had low BP readings before of 70/30. Has had a change on her Reflux medications. Daughter feels like patient is declining more rapidly. Does not want to do anything that is going to make her fall. Patient has slept all day. Caregiver took blood pressure again just now and it was 137/73 and pulse is 66. Retook again and it was 142/78 and irregular pulse rate of 67. Daughter just feels like patient just doesn't feel good. Patient is not complaining about anything, once in awhile her stomach hurts she says.

## 2015-05-29 NOTE — Telephone Encounter (Signed)
I'm glad her blood pressure came down.  Let's continue to monitor.

## 2015-05-29 NOTE — Telephone Encounter (Signed)
Was her pulse regular?  Was she having any pain at the time?  Any new weakness or numbness?  The caregiver should be doing this assessment rather than her daughter. If those were normal, may give clonidine 0.2mg  po once at her facility.  Then recheck bp 30 mins later.  If no better, may repeat one more dosage of clonidine 0.2mg  and again recheck bp 30 mins later.

## 2015-05-30 ENCOUNTER — Other Ambulatory Visit: Payer: Self-pay

## 2015-05-30 MED ORDER — OMEGA-3-ACID ETHYL ESTERS 1 G PO CAPS: ORAL_CAPSULE | ORAL | Status: DC

## 2015-05-30 NOTE — Telephone Encounter (Signed)
Patient's daughter aware of Dr.Reed's response.

## 2015-06-12 ENCOUNTER — Ambulatory Visit (INDEPENDENT_AMBULATORY_CARE_PROVIDER_SITE_OTHER): Payer: Medicare Other | Admitting: Nurse Practitioner

## 2015-06-12 ENCOUNTER — Encounter: Payer: Self-pay | Admitting: Nurse Practitioner

## 2015-06-12 VITALS — BP 90/60 | HR 71 | Temp 97.4°F | Resp 20 | Ht 62.0 in | Wt 190.4 lb

## 2015-06-12 DIAGNOSIS — I10 Essential (primary) hypertension: Secondary | ICD-10-CM | POA: Diagnosis not present

## 2015-06-12 MED ORDER — CARVEDILOL 3.125 MG PO TABS: 3.1250 mg | ORAL_TABLET | Freq: Two times a day (BID) | ORAL | Status: DC

## 2015-06-12 NOTE — Progress Notes (Signed)
Patient ID: Holly Grimes, female   DOB: 01/04/1927, 81 y.o.   MRN: [id number]    PCP: Bufford Spikes, DO  Allergies  Allergen Reactions  . Cephalexin Nausea And Vomiting  . Sulfa Antibiotics Other (See Comments)    Daughter unsure of reaction, but believes it is severe  . Sulfites     unknown    Chief Complaint  Patient presents with  . Acute Visit    BP running high 180/110     HPI: Patient is a 81 y.o. female seen in the office today due to high blood pressure. Pt with a pmh of parkinson disease, hx of falls, CHF, HTN, incontinence, dementia and more. Pt lives at [facility].  Pt taking lasix 20 mg daily with coreg 6.25 mg twice daily. Pt very sensitive to medication. High before her coreg and then low after medication. Also with increased sleepiness after she takes her medication.  Blood pressure is higher when she is lower when she is sitting when they take it later.  Worried about the sensitivity to medication. Notes being sleepy/groggy  after being given coreg. Has not been taking other medications which could make her sleepy due to sensitive.  No chest pains, shortness of breath, cough, vision changes, headaches noted from daughter  Review of Systems:  Review of Systems  Unable to perform ROS: Dementia    Past Medical History  Diagnosis Date  . Hypertension   . High cholesterol   . Mitral valve prolapse   . Dementia   . Parkinson's disease, Lewy body   . Unspecified constipation   . Onychia and paronychia of toe   . Spasm of muscle   . Abnormality of gait   . Orthostatic hypotension   . Major depressive disorder, single episode, unspecified   . Diaphragmatic hernia without mention of obstruction or gangrene   . Alzheimer's disease   . Osteoarthrosis, unspecified whether generalized or localized, unspecified site   . Osteoporosis, unspecified   . Unspecified constipation   . Sprain of ribs   . Delirium due to conditions classified elsewhere   . Diarrhea     . Edema   . Pain in joint, pelvic region and thigh   . Other malaise and fatigue   . Urinary frequency [id number]  . Personal history of fall   . Female stress incontinence   . Other and unspecified hyperlipidemia   . Obesity, unspecified   . Osteoarthrosis, unspecified whether generalized or localized, unspecified site   . Pain in joint, shoulder region   . Transient ischemic attack (TIA), and cerebral infarction without residual deficits(V12.54)   . Risk for falls    Past Surgical History  Procedure Laterality Date  . Joint replacement    . Tonsillectomy    . Ankle surgery Left 1998  . Total knee arthroplasty Right 2002  . Total knee arthroplasty Left 2005  . Hip arthroplasty Right 09/24/2013    Procedure: ARTHROPLASTY BIPOLAR HIP;  Surgeon: Loanne Drilling, MD;  Location: [facility];  Service: Orthopedics;  Laterality: Right;   Social History:   reports that she has never smoked. She has never used smokeless tobacco. She reports that she does not drink alcohol or use illicit drugs.  Family History  Problem Relation Age of Onset  . Adopted: Yes    Medications: Patient's Medications  New Prescriptions   No medications on file  Previous Medications   ACETAMINOPHEN (TYLENOL) 650 MG CR TABLET    Take (2) 650 mg  tablets by mouth BID (8 hours formulation)   ALBUTEROL (ACCUNEB) 1.25 MG/3ML NEBULIZER SOLUTION    Take 3 mLs (1.25 mg total) by nebulization every 6 (six) hours as needed for wheezing.   AMBULATORY NON FORMULARY MEDICATION    1. High Low Hospital Bed to allow for safe tranfers 2. Scoop Mattress 3. Bed Alarm and Chair Alarm-to reduce fall risk 4. (2) Halo Bed Rails 5. Fall Mat Dx: G30.9, I95.1, G20, Z91.81   ASCORBIC ACID (VITAMIN C WITH ROSE HIPS) 1000 MG TABLET    Take 1,000 mg by mouth daily.     CALCIUM CITRATE-VITAMIN D 500-400 MG-UNIT CHEWABLE TABLET    Chew 1 tablet by mouth 2 (two) times daily.   CARVEDILOL (COREG) 6.25 MG TABLET    Take 1 tablet (6.25 mg total)  by mouth 2 (two) times daily with a meal.   CHOLECALCIFEROL (VITAMIN D3) 2000 UNITS TABS    Take 2,000 Units by mouth daily.   CITALOPRAM (CELEXA) 20 MG TABLET    Take 1 tablet (20 mg total) by mouth daily.   CLOPIDOGREL (PLAVIX) 75 MG TABLET    Take one tablet by mouth once daily to help circulation   DOCUSATE SODIUM (COLACE) 100 MG CAPSULE    Take 1 capsule (100 mg total) by mouth 2 (two) times daily.   ESOMEPRAZOLE (NEXIUM) 40 MG CAPSULE    Take one capsule once daily for GERD   FEEDING SUPPLEMENT (BOOST HIGH PROTEIN) LIQD    Take 1 Container by mouth as needed.    FLUTICASONE (CUTIVATE) 0.05 % CREAM    Apply 1 application topically 2 (two) times daily. To affected areas as needed for skin irritaion   FUROSEMIDE (LASIX) 20 MG TABLET    Take one tablet by mouth once daily unless weight gain of 3 pounds in one day or 5 pounds in one week  take two tablets by mouth as needed for swelling   GUAIFENESIN (MUCINEX MAXIMUM STRENGTH) 1200 MG TB12    Take by mouth daily.    HALOPERIDOL (HALDOL) 0.5 MG TABLET    Take one tablet by mouth once daily as needed for agitation/psychosis   MELATONIN 1 MG/4ML LIQD    Take 1 mL (0.25 mg total) by mouth at bedtime as needed (insomnia).   MEMANTINE (NAMENDA XR) 28 MG CP24 24 HR CAPSULE    Take one capsule by mouth once daily for memory   MICONAZOLE (LOTRIMIN AF) 2 % POWDER    Apply topically as needed for itching (use beneath skin folds as needed).   MIRABEGRON ER (MYRBETRIQ) 50 MG TB24 TABLET    Take one tablet by mouth once daily for bladder   MULTIPLE VITAMINS-MINERALS (CENTRUM SILVER ULTRA WOMENS PO)    Take 1 tablet by mouth every morning.     NYSTATIN (MYCOSTATIN/NYSTOP) 100000 UNIT/GM POWD    2 times a day for 2 weeks in LT auxillia   OMEGA-3 ACID ETHYL ESTERS (LOVAZA) 1 G CAPSULE    Take two capsules by mouth twice daily for cholesterol   POLYETHYLENE GLYCOL POWDER (GLYCOLAX/MIRALAX) POWDER    Take 17 g by mouth daily as needed for moderate constipation.    PRAMOX-PE-GLYCERIN-PETROLATUM (PREPARATION H) 1-0.25-14.4-15 % CREA    Apply to hemorrhoids as needed for pain or itching   PROBIOTIC PRODUCT (ALIGN) 4 MG CAPS    Take 1 capsule by mouth daily.   RIVASTIGMINE (EXELON) 4.6 MG/24HR    Place 1 patch (4.6 mg total) onto the skin daily.  ROSUVASTATIN (CRESTOR) 10 MG TABLET    Take 1 tablet (10 mg total) by mouth at bedtime.   TDAP (BOOSTRIX) 5-2.5-18.5 LF-MCG/0.5 INJECTION    Inject 0.5 mLs into the muscle once.   WHEAT DEXTRIN (BENEFIBER) POWD    Take by mouth. Use one capful with liquid daily for constipation   ZOSTER VACCINE LIVE, PF, (ZOSTAVAX) 16109 UNT/0.65ML INJECTION    Inject 19,400 Units into the skin once.  Modified Medications   No medications on file  Discontinued Medications   No medications on file     Physical Exam:  Filed Vitals:   06/12/15 1139  BP: 90/60  Pulse: 71  Temp: 97.4 F (36.3 C)  TempSrc: Oral  Resp: 20  Height: 5\' 2"  (1.575 m)  Weight: 190 lb 6.4 oz (86.365 kg)  SpO2: 95%    Physical Exam  Constitutional: No distress.  Overweight white female, NAD  HENT:  Head: Normocephalic and atraumatic.  Cardiovascular: Normal rate, regular rhythm and normal heart sounds.   Pulmonary/Chest: Effort normal and breath sounds normal. No respiratory distress.  Abdominal: Soft. Bowel sounds are normal. She exhibits no distension. There is no tenderness.  Musculoskeletal: She exhibits no edema.  Neurological: She is alert.  Skin: Skin is warm and dry.  Psychiatric:  Sleepy, nods head to answer questions    Labs reviewed: Basic Metabolic Panel:  Recent Labs  60/45/40 1559 03/13/15 1216 05/14/15 1620  NA 142 145* 139  K 3.9 4.2 4.8  CL 99 104 97  CO2 28 27 25   GLUCOSE 114* 96 92  BUN 16 15 13   CREATININE 0.94 0.87 0.90  CALCIUM 9.2 9.2 9.4   Liver Function Tests: No results for input(s): AST, ALT, ALKPHOS, BILITOT, PROT, ALBUMIN in the last 8760 hours. No results for input(s): LIPASE, AMYLASE in the  last 8760 hours. No results for input(s): AMMONIA in the last 8760 hours. CBC:  Recent Labs  03/13/15 1216 05/14/15 1620  WBC 6.4 5.9  NEUTROABS 4.1 3.2  HCT 34.4 37.1   Lipid Panel: No results for input(s): CHOL, HDL, LDLCALC, TRIG, CHOLHDL, LDLDIRECT in the last 8760 hours. TSH: No results for input(s): TSH in the last 8760 hours. A1C: No results found for: HGBA1C   Assessment/Plan 1. Essential hypertension -blood pressure elevated when laying down prior to giving medication -education on consistency when taking blood pressure, to have pt sitting up when blood pressure is taken -okay if blood pressure is elevated prior to getting medication if blood pressure comes down after medication -will reduce coreg at this time due to hypotensive episodes with side effects of drowsiness   - carvedilol (COREG) 3.125 MG tablet; Take 1 tablet (3.125 mg total) by mouth 2 (two) times daily with a meal.  Dispense: 60 tablet; Refill: 0  To take blood pressure and record, to bring to follow up visit To follow up in 2 weeks  Briseidy Spark K. Biagio Borg  [facility] [phone] 8 am - 5 pm) [phone] (after hours)

## 2015-06-12 NOTE — Patient Instructions (Signed)
Cont to take blood pressure prior to medication but make sure She is sitting up whenever blood pressure is being taken.  If blood pressure is higher before medication is given and then improves once mediation has been given this is okay. We do not want it to stay high or low Goal 120-140/60-80  Due to medication changes please follow up with Dr Renato Gails in 2 weeks

## 2015-06-25 ENCOUNTER — Encounter: Payer: Self-pay | Admitting: Internal Medicine

## 2015-06-25 ENCOUNTER — Ambulatory Visit (INDEPENDENT_AMBULATORY_CARE_PROVIDER_SITE_OTHER): Payer: Medicare Other | Admitting: Internal Medicine

## 2015-06-25 VITALS — BP 106/78 | HR 82 | Temp 98.8°F | Resp 20 | Ht 62.0 in | Wt 189.8 lb

## 2015-06-25 DIAGNOSIS — I10 Essential (primary) hypertension: Secondary | ICD-10-CM

## 2015-06-25 DIAGNOSIS — Z23 Encounter for immunization: Secondary | ICD-10-CM

## 2015-06-25 DIAGNOSIS — B372 Candidiasis of skin and nail: Secondary | ICD-10-CM | POA: Diagnosis not present

## 2015-06-25 DIAGNOSIS — G20 Parkinson's disease: Secondary | ICD-10-CM

## 2015-06-25 DIAGNOSIS — G3183 Dementia with Lewy bodies: Secondary | ICD-10-CM

## 2015-06-25 DIAGNOSIS — F0280 Dementia in other diseases classified elsewhere without behavioral disturbance: Secondary | ICD-10-CM

## 2015-06-25 MED ORDER — NYSTATIN 100000 UNIT/GM EX POWD: CUTANEOUS | Status: DC

## 2015-06-25 MED ORDER — RIVASTIGMINE 4.6 MG/24HR TD PT24: 4.6000 mg | MEDICATED_PATCH | Freq: Every day | TRANSDERMAL | Status: DC

## 2015-06-25 MED ORDER — ZOSTER VACCINE LIVE 19400 UNT/0.65ML ~~LOC~~ SOLR: 0.6500 mL | Freq: Once | SUBCUTANEOUS | Status: DC

## 2015-06-25 NOTE — Addendum Note (Signed)
Addended byParticia Lather C on: 06/25/2015 04:50 PM   Modules accepted: Orders

## 2015-06-25 NOTE — Progress Notes (Signed)
Patient ID: Holly Grimes, female   DOB: 22-Sep-1927, 81 y.o.   MRN: [id number]   Location:  [facility]  Code Status: DNR Goals of Care: Advanced Directive information Does patient have an advance directive?: Yes   Chief Complaint  Patient presents with  . Medical Management of Chronic Issues    2 week follo-up for B/P  . Immunizations    Will take flu shot today    HPI: Patient is a 81 y.o. white female seen in the office today for med mgt of chronic diseases primarily to f/u on her blood pressure which was running low.   BP High early in the am (140s-150s sometimes) before gets up and takes meds.  Shanda Bumps had lowered her medications last time.  After meds, runs in 100s-120s.  Takes tylenol 8 hour and temp 98.8 today.  A little coughing.  Had a weight gain related with it and extra lasix.    No changes in urinary symptoms.     Review of Systems:  Review of Systems  Constitutional: Negative for fever, chills and malaise/fatigue.  HENT: Positive for hearing loss. Negative for congestion.   Eyes: Negative for blurred vision.       Glasses  Respiratory: Negative for shortness of breath.   Cardiovascular: Positive for leg swelling. Negative for chest pain.       Chronic edema  Gastrointestinal: Negative for abdominal pain, constipation, blood in stool and melena.  Genitourinary: Negative for dysuria, urgency and frequency.       Incontinent at times  Musculoskeletal: Negative for falls.  Skin: Negative for itching and rash.  Neurological: Negative for dizziness, loss of consciousness and weakness.  Endo/Heme/Allergies: Does not bruise/bleed easily.  Psychiatric/Behavioral: Positive for memory loss. Negative for depression.    Past Medical History  Diagnosis Date  . Hypertension   . High cholesterol   . Mitral valve prolapse   . Dementia   . Parkinson's disease, Lewy body   . Unspecified constipation   . Onychia and paronychia of  toe   . Spasm of muscle   . Abnormality of gait   . Orthostatic hypotension   . Major depressive disorder, single episode, unspecified   . Diaphragmatic hernia without mention of obstruction or gangrene   . Alzheimer's disease   . Osteoarthrosis, unspecified whether generalized or localized, unspecified site   . Osteoporosis, unspecified   . Unspecified constipation   . Sprain of ribs   . Delirium due to conditions classified elsewhere   . Diarrhea   . Edema   . Pain in joint, pelvic region and thigh   . Other malaise and fatigue   . Urinary frequency [id number]  . Personal history of fall   . Female stress incontinence   . Other and unspecified hyperlipidemia   . Obesity, unspecified   . Osteoarthrosis, unspecified whether generalized or localized, unspecified site   . Pain in joint, shoulder region   . Transient ischemic attack (TIA), and cerebral infarction without residual deficits(V12.54)   . Risk for falls     Past Surgical History  Procedure Laterality Date  . Joint replacement    . Tonsillectomy    . Ankle surgery Left 1998  . Total knee arthroplasty Right 2002  . Total knee arthroplasty Left 2005  . Hip arthroplasty Right 09/24/2013    Procedure: ARTHROPLASTY BIPOLAR HIP;  Surgeon: Loanne Drilling, MD;  Location: [facility];  Service: Orthopedics;  Laterality: Right;  Allergies  Allergen Reactions  . Cephalexin Nausea And Vomiting  . Sulfa Antibiotics Other (See Comments)    Daughter unsure of reaction, but believes it is severe  . Sulfites     unknown   Medications: Patient's Medications  New Prescriptions   No medications on file  Previous Medications   ACETAMINOPHEN (TYLENOL) 650 MG CR TABLET    Take (2) 650 mg tablets by mouth BID (8 hours formulation)   ALBUTEROL (ACCUNEB) 1.25 MG/3ML NEBULIZER SOLUTION    Take 3 mLs (1.25 mg total) by nebulization every 6 (six) hours as needed for wheezing.   AMBULATORY NON FORMULARY MEDICATION    1. High Low Hospital  Bed to allow for safe tranfers 2. Scoop Mattress 3. Bed Alarm and Chair Alarm-to reduce fall risk 4. (2) Halo Bed Rails 5. Fall Mat Dx: G30.9, I95.1, G20, Z91.81   ASCORBIC ACID (VITAMIN C WITH ROSE HIPS) 1000 MG TABLET    Take 1,000 mg by mouth daily.     CALCIUM CITRATE-VITAMIN D PO    Take 1 tablet by mouth 2 times daily     (500-400 mg )= 1 tablet   CARVEDILOL (COREG) 3.125 MG TABLET    Take 1 tablet (3.125 mg total) by mouth 2 (two) times daily with a meal.   CHOLECALCIFEROL (VITAMIN D3) 2000 UNITS TABS    Take 2,000 Units by mouth daily.   CITALOPRAM (CELEXA) 20 MG TABLET    Take 1 tablet (20 mg total) by mouth daily.   CLOPIDOGREL (PLAVIX) 75 MG TABLET    Take one tablet by mouth once daily to help circulation   DOCUSATE SODIUM (COLACE) 100 MG CAPSULE    Take 100 mg by mouth at bedtime.   ESOMEPRAZOLE (NEXIUM) 40 MG CAPSULE    Take one capsule once daily for GERD   FEEDING SUPPLEMENT (BOOST HIGH PROTEIN) LIQD    Take 1 Container by mouth as needed.    FLUTICASONE (CUTIVATE) 0.05 % CREAM    Apply 1 application topically 2 (two) times daily. To affected areas as needed for skin irritaion   FUROSEMIDE (LASIX) 20 MG TABLET    Take one tablet by mouth once daily unless weight gain of 3 pounds in one day or 5 pounds in one week  take two tablets by mouth as needed for swelling   GUAIFENESIN (MUCINEX MAXIMUM STRENGTH) 1200 MG TB12    Take by mouth daily.    HALOPERIDOL (HALDOL) 0.5 MG TABLET    Take one tablet by mouth once daily as needed for agitation/psychosis   MELATONIN 1 MG/4ML LIQD    Take 1 mL (0.25 mg total) by mouth at bedtime as needed (insomnia).   MEMANTINE (NAMENDA XR) 28 MG CP24 24 HR CAPSULE    Take one capsule by mouth once daily for memory   MICONAZOLE (LOTRIMIN AF) 2 % POWDER    Apply topically as needed for itching (use beneath skin folds as needed).   MIRABEGRON ER (MYRBETRIQ) 50 MG TB24 TABLET    Take one tablet by mouth once daily for bladder   MULTIPLE  VITAMINS-MINERALS (CENTRUM SILVER ULTRA WOMENS PO)    Take 1 tablet by mouth every morning.     NYSTATIN (MYCOSTATIN/NYSTOP) 100000 UNIT/GM POWD    2 times a day for 2 weeks in LT auxillia   OMEGA-3 ACID ETHYL ESTERS (LOVAZA) 1 G CAPSULE    Take two capsules by mouth twice daily for cholesterol   POLYETHYLENE GLYCOL POWDER (GLYCOLAX/MIRALAX) POWDER  Take 17 g by mouth daily as needed for moderate constipation.   PRAMOX-PE-GLYCERIN-PETROLATUM (PREPARATION H) 1-0.25-14.4-15 % CREA    Apply to hemorrhoids as needed for pain or itching   PROBIOTIC PRODUCT (ALIGN) 4 MG CAPS    Take 1 capsule by mouth daily.   RIVASTIGMINE (EXELON) 4.6 MG/24HR    Place 1 patch (4.6 mg total) onto the skin daily.   ROSUVASTATIN (CRESTOR) 10 MG TABLET    Take 1 tablet (10 mg total) by mouth at bedtime.   WHEAT DEXTRIN (BENEFIBER) POWD    Take by mouth. Use 2 table spoons Bid in liquid   ZOSTER VACCINE LIVE, PF, (ZOSTAVAX) 85462 UNT/0.65ML INJECTION    Inject 19,400 Units into the skin once.  Modified Medications   No medications on file  Discontinued Medications   CALCIUM CITRATE-VITAMIN D 500-400 MG-UNIT CHEWABLE TABLET    Chew 1 tablet by mouth 2 (two) times daily.   DOCUSATE SODIUM (COLACE) 100 MG CAPSULE    Take 1 capsule (100 mg total) by mouth 2 (two) times daily.   TDAP (BOOSTRIX) 5-2.5-18.5 LF-MCG/0.5 INJECTION    Inject 0.5 mLs into the muscle once.    Physical Exam: Filed Vitals:   06/25/15 1551  BP: 106/78  Pulse: 82  Temp: 98.8 F (37.1 C)  TempSrc: Oral  Resp: 20  Height:  (1.575 m)  Weight: 189 lb 12.8 oz (86.093 kg)  SpO2: 95%   Physical Exam  Constitutional: She appears well-developed and well-nourished. No distress.  HENT:  Head: Normocephalic and atraumatic.  Cardiovascular: Normal rate, regular rhythm, normal heart sounds and intact distal pulses.   Pulmonary/Chest: Effort normal and breath sounds normal. She has no rales.  Abdominal: Soft. Bowel sounds are normal. She exhibits  no distension. There is no tenderness.  Musculoskeletal: Normal range of motion.  Neurological: She is alert.  Lethargic today, falling asleep some during visit  Skin: Skin is warm and dry.  Psychiatric: She has a normal mood and affect.    Labs reviewed: Basic Metabolic Panel:  Recent Labs  70/35/00 1559 03/13/15 1216 05/14/15 1620  NA 142 145* 139  K 3.9 4.2 4.8  CL 99 104 97  CO2 GLUCOSE 114* 96 92  BUN CREATININE 0.94 0.87 0.90  CALCIUM 9.2 9.2 9.4   Liver Function Tests: No results for input(s): AST, ALT, ALKPHOS, BILITOT, PROT, ALBUMIN in the last 8760 hours. No results for input(s): LIPASE, AMYLASE in the last 8760 hours. No results for input(s): AMMONIA in the last 8760 hours. CBC:  Recent Labs  03/13/15 1216 05/14/15 1620  WBC 6.4 5.9  NEUTROABS 4.1 3.2  HCT 34.4 37.1   Lipid Panel: No results for input(s): CHOL, HDL, LDLCALC, TRIG, CHOLHDL, LDLDIRECT in the last 8760 hours. No results found for: HGBA1C  Procedures since last visit:  Assessment/Plan 1. Essential hypertension -bps after meds continue to run low and she's already on lowest dose of coreg -cont 3.125mg  po bid--will not work right once a day and suspect she'll be really high in the am then -cont daily lasix  with prn dose for weight gain  2. Candidal skin infection - cont nystatin beneath axilla, breasts, groin area - nystatin (MYCOSTATIN/NYSTOP) 100000 UNIT/GM POWD; 2 times a day for 2 week to affected area  Dispense: 60 g; Refill: 5  3. Parkinson's disease, Lewy body -gradually progressing cognitive decline -has caregivers all of the time when her daughter is not there  4.  Need for prophylactic vaccination and inoculation against influenza -flu shot given today  Next appt:  Keep regular visit as scheduled  Vannary Greening L. Siarra Gilkerson, D.O. Geriatrics [facility] [address], [place] [address] Cell Phone (Mon-Fri 8am-5pm):   [phone] On Call:  [phone] & follow prompts after 5pm & weekends Office Phone:  [phone] Office Fax:  [phone]

## 2015-06-27 ENCOUNTER — Other Ambulatory Visit: Payer: Self-pay | Admitting: Internal Medicine

## 2015-07-05 ENCOUNTER — Other Ambulatory Visit: Payer: Self-pay | Admitting: Internal Medicine

## 2015-07-07 DIAGNOSIS — I951 Orthostatic hypotension: Secondary | ICD-10-CM | POA: Diagnosis not present

## 2015-07-07 DIAGNOSIS — M810 Age-related osteoporosis without current pathological fracture: Secondary | ICD-10-CM | POA: Diagnosis not present

## 2015-07-07 DIAGNOSIS — G20 Parkinson's disease: Secondary | ICD-10-CM | POA: Diagnosis not present

## 2015-07-07 DIAGNOSIS — I2510 Atherosclerotic heart disease of native coronary artery without angina pectoris: Secondary | ICD-10-CM | POA: Diagnosis not present

## 2015-07-07 DIAGNOSIS — I509 Heart failure, unspecified: Secondary | ICD-10-CM | POA: Diagnosis not present

## 2015-07-07 DIAGNOSIS — Z9181 History of falling: Secondary | ICD-10-CM | POA: Diagnosis not present

## 2015-07-07 DIAGNOSIS — F319 Bipolar disorder, unspecified: Secondary | ICD-10-CM | POA: Diagnosis not present

## 2015-07-07 DIAGNOSIS — I110 Hypertensive heart disease with heart failure: Secondary | ICD-10-CM | POA: Diagnosis not present

## 2015-07-07 DIAGNOSIS — F0281 Dementia in other diseases classified elsewhere with behavioral disturbance: Secondary | ICD-10-CM | POA: Diagnosis not present

## 2015-07-07 DIAGNOSIS — Z7902 Long term (current) use of antithrombotics/antiplatelets: Secondary | ICD-10-CM | POA: Diagnosis not present

## 2015-07-07 DIAGNOSIS — R260 Ataxic gait: Secondary | ICD-10-CM | POA: Diagnosis not present

## 2015-07-08 ENCOUNTER — Other Ambulatory Visit: Payer: Self-pay | Admitting: Nurse Practitioner

## 2015-08-13 ENCOUNTER — Ambulatory Visit: Payer: Self-pay | Admitting: Internal Medicine

## 2015-08-20 ENCOUNTER — Encounter (HOSPITAL_COMMUNITY): Payer: Self-pay | Admitting: Emergency Medicine

## 2015-08-20 ENCOUNTER — Emergency Department (HOSPITAL_COMMUNITY): Payer: Medicare Other

## 2015-08-20 ENCOUNTER — Inpatient Hospital Stay (HOSPITAL_COMMUNITY)
Admission: EM | Admit: 2015-08-20 | Discharge: 2015-08-22 | DRG: 312 | Disposition: A | Discharge status: Home Health | Payer: Medicare Other | Attending: Internal Medicine | Admitting: Internal Medicine

## 2015-08-20 DIAGNOSIS — I959 Hypotension, unspecified: Secondary | ICD-10-CM | POA: Diagnosis present

## 2015-08-20 DIAGNOSIS — Z881 Allergy status to other antibiotic agents status: Secondary | ICD-10-CM | POA: Diagnosis not present

## 2015-08-20 DIAGNOSIS — M810 Age-related osteoporosis without current pathological fracture: Secondary | ICD-10-CM | POA: Diagnosis present

## 2015-08-20 DIAGNOSIS — R05 Cough: Secondary | ICD-10-CM | POA: Diagnosis not present

## 2015-08-20 DIAGNOSIS — Z96641 Presence of right artificial hip joint: Secondary | ICD-10-CM | POA: Diagnosis present

## 2015-08-20 DIAGNOSIS — F0390 Unspecified dementia without behavioral disturbance: Secondary | ICD-10-CM | POA: Diagnosis present

## 2015-08-20 DIAGNOSIS — Z79899 Other long term (current) drug therapy: Secondary | ICD-10-CM

## 2015-08-20 DIAGNOSIS — F319 Bipolar disorder, unspecified: Secondary | ICD-10-CM | POA: Diagnosis present

## 2015-08-20 DIAGNOSIS — I341 Nonrheumatic mitral (valve) prolapse: Secondary | ICD-10-CM | POA: Diagnosis present

## 2015-08-20 DIAGNOSIS — R1030 Lower abdominal pain, unspecified: Secondary | ICD-10-CM | POA: Diagnosis present

## 2015-08-20 DIAGNOSIS — Z96653 Presence of artificial knee joint, bilateral: Secondary | ICD-10-CM | POA: Diagnosis present

## 2015-08-20 DIAGNOSIS — R748 Abnormal levels of other serum enzymes: Secondary | ICD-10-CM | POA: Diagnosis present

## 2015-08-20 DIAGNOSIS — R6510 Systemic inflammatory response syndrome (SIRS) of non-infectious origin without acute organ dysfunction: Secondary | ICD-10-CM | POA: Diagnosis present

## 2015-08-20 DIAGNOSIS — E785 Hyperlipidemia, unspecified: Secondary | ICD-10-CM | POA: Diagnosis present

## 2015-08-20 DIAGNOSIS — I950 Idiopathic hypotension: Secondary | ICD-10-CM | POA: Diagnosis not present

## 2015-08-20 DIAGNOSIS — K219 Gastro-esophageal reflux disease without esophagitis: Secondary | ICD-10-CM | POA: Diagnosis present

## 2015-08-20 DIAGNOSIS — Z8249 Family history of ischemic heart disease and other diseases of the circulatory system: Secondary | ICD-10-CM | POA: Diagnosis not present

## 2015-08-20 DIAGNOSIS — G3183 Dementia with Lewy bodies: Secondary | ICD-10-CM | POA: Diagnosis present

## 2015-08-20 DIAGNOSIS — D72829 Elevated white blood cell count, unspecified: Secondary | ICD-10-CM | POA: Diagnosis present

## 2015-08-20 DIAGNOSIS — I951 Orthostatic hypotension: Principal | ICD-10-CM | POA: Diagnosis present

## 2015-08-20 DIAGNOSIS — Z66 Do not resuscitate: Secondary | ICD-10-CM | POA: Diagnosis present

## 2015-08-20 DIAGNOSIS — F0280 Dementia in other diseases classified elsewhere without behavioral disturbance: Secondary | ICD-10-CM | POA: Diagnosis present

## 2015-08-20 DIAGNOSIS — Z8673 Personal history of transient ischemic attack (TIA), and cerebral infarction without residual deficits: Secondary | ICD-10-CM

## 2015-08-20 DIAGNOSIS — Z882 Allergy status to sulfonamides status: Secondary | ICD-10-CM

## 2015-08-20 DIAGNOSIS — G309 Alzheimer's disease, unspecified: Secondary | ICD-10-CM | POA: Diagnosis present

## 2015-08-20 DIAGNOSIS — E7800 Pure hypercholesterolemia, unspecified: Secondary | ICD-10-CM | POA: Diagnosis present

## 2015-08-20 DIAGNOSIS — Z7902 Long term (current) use of antithrombotics/antiplatelets: Secondary | ICD-10-CM | POA: Diagnosis not present

## 2015-08-20 DIAGNOSIS — E872 Acidosis: Secondary | ICD-10-CM | POA: Diagnosis present

## 2015-08-20 DIAGNOSIS — E860 Dehydration: Secondary | ICD-10-CM | POA: Diagnosis present

## 2015-08-20 DIAGNOSIS — I5032 Chronic diastolic (congestive) heart failure: Secondary | ICD-10-CM | POA: Diagnosis present

## 2015-08-20 DIAGNOSIS — I110 Hypertensive heart disease with heart failure: Secondary | ICD-10-CM | POA: Diagnosis present

## 2015-08-20 DIAGNOSIS — E876 Hypokalemia: Secondary | ICD-10-CM | POA: Diagnosis present

## 2015-08-20 DIAGNOSIS — K5730 Diverticulosis of large intestine without perforation or abscess without bleeding: Secondary | ICD-10-CM | POA: Diagnosis not present

## 2015-08-20 DIAGNOSIS — R55 Syncope and collapse: Secondary | ICD-10-CM | POA: Diagnosis not present

## 2015-08-20 DIAGNOSIS — M1990 Unspecified osteoarthritis, unspecified site: Secondary | ICD-10-CM | POA: Diagnosis present

## 2015-08-20 DIAGNOSIS — F3110 Bipolar disorder, current episode manic without psychotic features, unspecified: Secondary | ICD-10-CM | POA: Diagnosis not present

## 2015-08-20 DIAGNOSIS — R402421 Glasgow coma scale score 9-12, in the field [EMT or ambulance]: Secondary | ICD-10-CM | POA: Diagnosis not present

## 2015-08-20 DIAGNOSIS — B372 Candidiasis of skin and nail: Secondary | ICD-10-CM

## 2015-08-20 LAB — CBC WITH DIFFERENTIAL/PLATELET
Basophils Absolute: 0 10*3/uL (ref 0.0–0.1)
Basophils Relative: 0 %
EOS ABS: 0 10*3/uL (ref 0.0–0.7)
EOS PCT: 0 %
HCT: 39.1 % (ref 36.0–46.0)
Hemoglobin: 12.7 g/dL (ref 12.0–15.0)
LYMPHS ABS: 1 10*3/uL (ref 0.7–4.0)
LYMPHS PCT: 7 %
MCH: 31 pg (ref 26.0–34.0)
MCHC: 32.5 g/dL (ref 30.0–36.0)
MCV: 95.4 fL (ref 78.0–100.0)
MONO ABS: 1 10*3/uL (ref 0.1–1.0)
Monocytes Relative: 7 %
Neutro Abs: 12.6 10*3/uL — ABNORMAL HIGH (ref 1.7–7.7)
Neutrophils Relative %: 86 %
Platelets: 210 10*3/uL (ref 150–400)
RBC: 4.1 MIL/uL (ref 3.87–5.11)
RDW: 13.8 % (ref 11.5–15.5)
WBC: 14.7 10*3/uL — AB (ref 4.0–10.5)

## 2015-08-20 LAB — I-STAT CG4 LACTIC ACID, ED
Lactic Acid, Venous: 3.99 mmol/L (ref 0.5–2.0)
Lactic Acid, Venous: 0.78 mmol/L (ref 0.5–2.0)

## 2015-08-20 LAB — I-STAT TROPONIN, ED: TROPONIN I, POC: 0.02 ng/mL (ref 0.00–0.08)

## 2015-08-20 LAB — COMPREHENSIVE METABOLIC PANEL
ALK PHOS: 52 U/L (ref 38–126)
ALT: 14 U/L (ref 14–54)
AST: 29 U/L (ref 15–41)
Albumin: 3.7 g/dL (ref 3.5–5.0)
Anion gap: 11 (ref 5–15)
BILIRUBIN TOTAL: 1.4 mg/dL — AB (ref 0.3–1.2)
BUN: 20 mg/dL (ref 6–20)
CALCIUM: 9.4 mg/dL (ref 8.9–10.3)
CO2: 26 mmol/L (ref 22–32)
CREATININE: 1.35 mg/dL — AB (ref 0.44–1.00)
Chloride: 104 mmol/L (ref 101–111)
GFR, EST AFRICAN AMERICAN: 40 mL/min — AB (ref >60)
GFR, EST NON AFRICAN AMERICAN: 35 mL/min — AB (ref >60)
Glucose, Bld: 153 mg/dL — ABNORMAL HIGH (ref 65–99)
Potassium: 4.1 mmol/L (ref 3.5–5.1)
Sodium: 141 mmol/L (ref 135–145)
TOTAL PROTEIN: 7.2 g/dL (ref 6.5–8.1)

## 2015-08-20 LAB — URINALYSIS, ROUTINE W REFLEX MICROSCOPIC
BILIRUBIN URINE: NEGATIVE
Glucose, UA: NEGATIVE mg/dL
HGB URINE DIPSTICK: NEGATIVE
KETONES UR: NEGATIVE mg/dL
Leukocytes, UA: NEGATIVE
Nitrite: NEGATIVE
Protein, ur: NEGATIVE mg/dL
SPECIFIC GRAVITY, URINE: 1.015 (ref 1.005–1.030)
UROBILINOGEN UA: 0.2 mg/dL (ref 0.0–1.0)
pH: 5 (ref 5.0–8.0)

## 2015-08-20 LAB — BRAIN NATRIURETIC PEPTIDE: B Natriuretic Peptide: 75.2 pg/mL (ref 0.0–100.0)

## 2015-08-20 MED ORDER — SODIUM CHLORIDE 0.9 % IV BOLUS (SEPSIS)
1000.0000 mL | Freq: Once | INTRAVENOUS | Status: AC
  Administered 2015-08-20: 1000 mL via INTRAVENOUS

## 2015-08-20 MED ORDER — CALCIUM CITRATE-VITAMIN D 500-400 MG-UNIT PO CHEW
1.0000 | CHEWABLE_TABLET | Freq: Two times a day (BID) | ORAL | Status: DC
  Administered 2015-08-21 – 2015-08-22 (×3): 1 via ORAL
  Filled 2015-08-20 (×4): qty 1

## 2015-08-20 MED ORDER — ONDANSETRON HCL 4 MG PO TABS: 4.0000 mg | ORAL_TABLET | Freq: Four times a day (QID) | ORAL | Status: DC | PRN

## 2015-08-20 MED ORDER — DOCUSATE SODIUM 100 MG PO CAPS
100.0000 mg | ORAL_CAPSULE | Freq: Every day | ORAL | Status: DC
  Administered 2015-08-21 (×2): 100 mg via ORAL
  Filled 2015-08-20 (×2): qty 1

## 2015-08-20 MED ORDER — CLOPIDOGREL BISULFATE 75 MG PO TABS
75.0000 mg | ORAL_TABLET | Freq: Every day | ORAL | Status: DC
  Administered 2015-08-21 – 2015-08-22 (×2): 75 mg via ORAL
  Filled 2015-08-20 (×2): qty 1

## 2015-08-20 MED ORDER — VANCOMYCIN HCL IN DEXTROSE 1-5 GM/200ML-% IV SOLN
1000.0000 mg | Freq: Once | INTRAVENOUS | Status: AC
  Administered 2015-08-21: 1000 mg via INTRAVENOUS
  Filled 2015-08-20 (×2): qty 200

## 2015-08-20 MED ORDER — MIRABEGRON ER 50 MG PO TB24
50.0000 mg | ORAL_TABLET | Freq: Every day | ORAL | Status: DC
  Administered 2015-08-21 – 2015-08-22 (×2): 50 mg via ORAL
  Filled 2015-08-20 (×2): qty 1

## 2015-08-20 MED ORDER — CITALOPRAM HYDROBROMIDE 20 MG PO TABS
20.0000 mg | ORAL_TABLET | Freq: Every day | ORAL | Status: DC
  Administered 2015-08-21 – 2015-08-22 (×2): 20 mg via ORAL
  Filled 2015-08-20 (×2): qty 1

## 2015-08-20 MED ORDER — OMEGA-3-ACID ETHYL ESTERS 1 G PO CAPS
1.0000 g | ORAL_CAPSULE | Freq: Two times a day (BID) | ORAL | Status: DC
  Administered 2015-08-21 – 2015-08-22 (×3): 1 g via ORAL
  Filled 2015-08-20 (×3): qty 1

## 2015-08-20 MED ORDER — ACETAMINOPHEN 325 MG PO TABS: 650.0000 mg | ORAL_TABLET | Freq: Four times a day (QID) | ORAL | Status: DC | PRN

## 2015-08-20 MED ORDER — RIVASTIGMINE 4.6 MG/24HR TD PT24
4.6000 mg | MEDICATED_PATCH | Freq: Every day | TRANSDERMAL | Status: DC
Start: 2015-08-21 — End: 2015-08-22
  Administered 2015-08-21 – 2015-08-22 (×2): 4.6 mg via TRANSDERMAL
  Filled 2015-08-20 (×2): qty 1

## 2015-08-20 MED ORDER — VITAMIN D 1000 UNITS PO TABS
2000.0000 [IU] | ORAL_TABLET | Freq: Every day | ORAL | Status: DC
  Administered 2015-08-21 – 2015-08-22 (×2): 2000 [IU] via ORAL
  Filled 2015-08-20 (×2): qty 2

## 2015-08-20 MED ORDER — SODIUM CHLORIDE 0.9 % IV BOLUS (SEPSIS): 250.0000 mL | Freq: Once | INTRAVENOUS | Status: DC

## 2015-08-20 MED ORDER — RISAQUAD PO CAPS
1.0000 | ORAL_CAPSULE | Freq: Every day | ORAL | Status: DC
  Administered 2015-08-21 – 2015-08-22 (×2): 1 via ORAL
  Filled 2015-08-20 (×2): qty 1

## 2015-08-20 MED ORDER — SODIUM CHLORIDE 0.9 % IV SOLN
INTRAVENOUS | Status: DC
  Administered 2015-08-20: 23:00:00 via INTRAVENOUS

## 2015-08-20 MED ORDER — TRIAMCINOLONE ACETONIDE 0.1 % EX CREA
1.0000 "application " | TOPICAL_CREAM | Freq: Two times a day (BID) | CUTANEOUS | Status: DC
  Administered 2015-08-21: 1 via TOPICAL
  Filled 2015-08-20: qty 15

## 2015-08-20 MED ORDER — ALBUTEROL SULFATE (2.5 MG/3ML) 0.083% IN NEBU: 2.5000 mg | INHALATION_SOLUTION | Freq: Four times a day (QID) | RESPIRATORY_TRACT | Status: DC | PRN

## 2015-08-20 MED ORDER — ENOXAPARIN SODIUM 40 MG/0.4ML ~~LOC~~ SOLN
40.0000 mg | SUBCUTANEOUS | Status: DC
  Administered 2015-08-21 – 2015-08-22 (×2): 40 mg via SUBCUTANEOUS
  Filled 2015-08-20 (×2): qty 0.4

## 2015-08-20 MED ORDER — CARVEDILOL 3.125 MG PO TABS
3.1250 mg | ORAL_TABLET | Freq: Two times a day (BID) | ORAL | Status: DC
  Administered 2015-08-21 – 2015-08-22 (×3): 3.125 mg via ORAL
  Filled 2015-08-20 (×4): qty 1

## 2015-08-20 MED ORDER — MEMANTINE HCL ER 28 MG PO CP24
28.0000 mg | ORAL_CAPSULE | Freq: Every day | ORAL | Status: DC
  Administered 2015-08-21 – 2015-08-22 (×2): 28 mg via ORAL
  Filled 2015-08-20 (×2): qty 1

## 2015-08-20 MED ORDER — IOHEXOL 350 MG/ML SOLN
100.0000 mL | Freq: Once | INTRAVENOUS | Status: AC | PRN
Start: 2015-08-20 — End: 2015-08-20
  Administered 2015-08-20: 100 mL via INTRAVENOUS

## 2015-08-20 MED ORDER — SODIUM CHLORIDE 0.9 % IV BOLUS (SEPSIS)
500.0000 mL | Freq: Once | INTRAVENOUS | Status: AC
  Administered 2015-08-20: 500 mL via INTRAVENOUS

## 2015-08-20 MED ORDER — GUAIFENESIN ER 600 MG PO TB12
1200.0000 mg | ORAL_TABLET | Freq: Every day | ORAL | Status: DC
  Administered 2015-08-21 – 2015-08-22 (×2): 1200 mg via ORAL
  Filled 2015-08-20 (×2): qty 2

## 2015-08-20 MED ORDER — PIPERACILLIN-TAZOBACTAM 3.375 G IVPB 30 MIN
3.3750 g | Freq: Once | INTRAVENOUS | Status: AC
Start: 2015-08-20 — End: 2015-08-20
  Administered 2015-08-20: 3.375 g via INTRAVENOUS
  Filled 2015-08-20: qty 50

## 2015-08-20 MED ORDER — ACETAMINOPHEN 650 MG RE SUPP: 650.0000 mg | Freq: Four times a day (QID) | RECTAL | Status: DC | PRN

## 2015-08-20 MED ORDER — ONDANSETRON HCL 4 MG/2ML IJ SOLN: 4.0000 mg | Freq: Four times a day (QID) | INTRAMUSCULAR | Status: DC | PRN

## 2015-08-20 MED ORDER — MELATONIN 1 MG/4ML PO LIQD: 1.0000 mL | Freq: Every evening | ORAL | Status: DC | PRN

## 2015-08-20 MED ORDER — ROSUVASTATIN CALCIUM 10 MG PO TABS
10.0000 mg | ORAL_TABLET | Freq: Every day | ORAL | Status: DC
  Administered 2015-08-21 (×2): 10 mg via ORAL
  Filled 2015-08-20 (×2): qty 1

## 2015-08-20 MED ORDER — PANTOPRAZOLE SODIUM 40 MG PO TBEC
40.0000 mg | DELAYED_RELEASE_TABLET | Freq: Every day | ORAL | Status: DC
Start: 2015-08-21 — End: 2015-08-22
  Administered 2015-08-21 – 2015-08-22 (×2): 40 mg via ORAL
  Filled 2015-08-20 (×2): qty 1

## 2015-08-20 MED ORDER — BOOST PLUS PO LIQD
1.0000 | Freq: Three times a day (TID) | ORAL | Status: DC
  Administered 2015-08-21 – 2015-08-22 (×6): 237 mL via ORAL
  Filled 2015-08-20 (×6): qty 237

## 2015-08-20 MED ORDER — VITAMIN C 500 MG PO TABS
1000.0000 mg | ORAL_TABLET | Freq: Every day | ORAL | Status: DC
  Administered 2015-08-21 – 2015-08-22 (×2): 1000 mg via ORAL
  Filled 2015-08-20 (×2): qty 2

## 2015-08-20 NOTE — ED Notes (Addendum)
Patient here from Holly Grimes with complaints of vasovagal while using bathroom. Hypotensive, clammy. Alert now. Hx dementia. CBG 214

## 2015-08-20 NOTE — H&P (Signed)
[facility] History and Physical  Holly Grimes:[id number] DOB: [birth year] DOA: 08/20/2015  Referring physician: Dr.Yao. PCP: Bufford Spikes, DO  Specialists: None.  Chief Complaint: Low blood pressure and pathology.  History obtained from patient's daughter.  HPI: Holly Grimes is a 81 y.o. female history of dementia with Lewy body, diastolic CHF last EF measured was 55% with grade 2 diastolic dysfunction in September 2015, bipolar disorder was brought into the ER after patient was found to be hypotensive. As per patient's daughter patient has been lethargic last few days. And today occupational therapist check patient's blood pressure and was found to be in 60s systolic multiple times. The patient's blood pressure was more than 100 systolic and lactic acid was elevated. Chest x-ray shows possible bronchitic changes and UA was unremarkable. Patient was afebrile. Since patient had possible tenderness in the abdomen CT angiogram of the abdomen and pelvis is done which was unremarkable. Patient was given fluid bolus following which patient's lactic acid improved and patient has been admitted for hypotension with elevated lactic acid of unknown cause. Patient's daughter states that patient has bipolar disorder and last week was in manic phase.  Review of Systems: As presented in the history of presenting illness, rest negative.  Past Medical History  Diagnosis Date  . Hypertension   . High cholesterol   . Mitral valve prolapse   . Dementia   . Parkinson's disease, Lewy body (HCC)   . Unspecified constipation   . Onychia and paronychia of toe   . Spasm of muscle   . Abnormality of gait   . Orthostatic hypotension   . Major depressive disorder, single episode, unspecified (HCC)   . Diaphragmatic hernia without mention of obstruction or gangrene   . Alzheimer's disease   . Osteoarthrosis, unspecified whether generalized or localized, unspecified site   . Osteoporosis, unspecified    . Unspecified constipation   . Sprain of ribs   . Delirium due to conditions classified elsewhere   . Diarrhea   . Edema   . Pain in joint, pelvic region and thigh   . Other malaise and fatigue   . Urinary frequency [id number]  . Personal history of fall   . Female stress incontinence   . Other and unspecified hyperlipidemia   . Obesity, unspecified   . Osteoarthrosis, unspecified whether generalized or localized, unspecified site   . Pain in joint, shoulder region   . Transient ischemic attack (TIA), and cerebral infarction without residual deficits   . Risk for falls    Past Surgical History  Procedure Laterality Date  . Joint replacement    . Tonsillectomy    . Ankle surgery Left 1998  . Total knee arthroplasty Right 2002  . Total knee arthroplasty Left 2005  . Hip arthroplasty Right 09/24/2013    Procedure: ARTHROPLASTY BIPOLAR HIP;  Surgeon: Loanne Drilling, MD;  Location: [facility];  Service: Orthopedics;  Laterality: Right;   Social History:  reports that she has never smoked. She has never used smokeless tobacco. She reports that she does not drink alcohol or use illicit drugs. Where does patient live in assisted living facility. Can patient participate in ADLs? No.  Allergies  Allergen Reactions  . Cephalexin Nausea And Vomiting  . Sulfa Antibiotics Other (See Comments)    Daughter unsure of reaction, but believes it is severe  . Sulfites     unknown    Family History:  Family History  Problem Relation Age of Onset  .  Adopted: Yes  . Hypertension Other       Prior to Admission medications   Medication Sig Start Date End Date Taking? Authorizing Provider  acetaminophen (TYLENOL) 650 MG CR tablet Take (2) 650 mg tablets by mouth BID (8 hours formulation)   Yes Historical Provider, MD  albuterol (ACCUNEB) 1.25 MG/3ML nebulizer solution Take 3 mLs (1.25 mg total) by nebulization every 6 (six) hours as needed for wheezing. 01/08/15  Yes Tiffany L Reed, DO  AMBULATORY  NON FORMULARY MEDICATION 1. High Low Hospital Bed to allow for safe tranfers 2. Scoop Mattress 3. Bed Alarm and Chair Alarm-to reduce fall risk 4. (2) Halo Bed Rails 5. Fall Mat Dx: G30.9, I95.1, G20, Z91.81 01/05/15  Yes Tiffany L Reed, DO  Ascorbic Acid (VITAMIN C WITH ROSE HIPS) 1000 MG tablet Take 1,000 mg by mouth daily.     Yes Historical Provider, MD  CALCIUM CITRATE-VITAMIN D PO Take 1 tablet by mouth 2 times daily     (500-400 mg )= 1 tablet   Yes Historical Provider, MD  carvedilol (COREG) 3.125 MG tablet TAKE 1 TABLET BY MOUTH TWICE DAILY WITH A MEAL 07/09/15  Yes Tiffany L Reed, DO  Cholecalciferol (VITAMIN D3) 2000 UNITS TABS Take 2,000 Units by mouth daily. 01/08/15  Yes Tiffany L Reed, DO  citalopram (CELEXA) 20 MG tablet TAKE 1 TABLET BY MOUTH ONCE A DAY. 06/27/15  Yes Tiffany L Reed, DO  clopidogrel (PLAVIX) 75 MG tablet Take one tablet by mouth once daily to help circulation 01/08/15  Yes Tiffany L Reed, DO  docusate sodium (COLACE) 100 MG capsule Take 100 mg by mouth at bedtime.   Yes Historical Provider, MD  esomeprazole (NEXIUM) 40 MG capsule Take one capsule once daily for GERD 05/22/15  Yes Tiffany L Reed, DO  feeding supplement (BOOST HIGH PROTEIN) LIQD Take 1 Container by mouth as needed.    Yes Historical Provider, MD  fluticasone (CUTIVATE) 0.05 % cream Apply 1 application topically 2 (two) times daily. To affected areas as needed for skin irritaion 01/08/15  Yes Tiffany L Reed, DO  furosemide (LASIX) 20 MG tablet Take one tablet by mouth once daily unless weight gain of 3 pounds in one day or 5 pounds in one week  take two tablets by mouth as needed for swelling Patient taking differently: Take 20 mg by mouth daily. Take one tablet by mouth once daily unless weight gain of 3 pounds in one day or 5 pounds in one week  take two tablets by mouth as needed for swelling 01/08/15  Yes Tiffany L Reed, DO  Guaifenesin (MUCINEX MAXIMUM STRENGTH) 1200 MG TB12 Take 1,200 mg by mouth daily.     Yes Historical Provider, MD  haloperidol (HALDOL) 0.5 MG tablet Take one tablet by mouth once daily as needed for agitation/psychosis 01/08/15  Yes Tiffany L Reed, DO  Melatonin 1 MG/4ML LIQD Take 1 mL (0.25 mg total) by mouth at bedtime as needed (insomnia). 02/01/15  Yes Tiffany L Reed, DO  miconazole (LOTRIMIN AF) 2 % powder Apply topically as needed for itching (use beneath skin folds as needed). 02/01/15  Yes Tiffany L Reed, DO  Multiple Vitamins-Minerals (CENTRUM SILVER ULTRA WOMENS PO) Take 1 tablet by mouth every morning.     Yes Historical Provider, MD  MYRBETRIQ 50 MG TB24 tablet TAKE 1 TABLET BY MOUTH ONCE A DAY FOR BLADDER. 07/05/15  Yes Tiffany L Reed, DO  NAMENDA XR 28 MG CP24 24 hr capsule TAKE 1 CAPSULE  BY MOUTH EVERY DAY FOR MEMORY. 06/27/15  Yes Tiffany L Reed, DO  nystatin (MYCOSTATIN/NYSTOP) 100000 UNIT/GM POWD 2 times a day for 2 week to affected area Patient taking differently: Apply 1 g topically 2 (two) times daily as needed (for irration).  06/25/15  Yes Tiffany L Reed, DO  omega-3 acid ethyl esters (LOVAZA) 1 G capsule Take two capsules by mouth twice daily for cholesterol 05/30/15  Yes Tiffany L Reed, DO  Pramox-PE-Glycerin-Petrolatum (PREPARATION H) 1-0.25-14.4-15 % CREA Apply to hemorrhoids as needed for pain or itching 02/01/15  Yes Tiffany L Reed, DO  Probiotic Product (ALIGN) 4 MG CAPS Take 1 capsule by mouth daily. 01/08/15  Yes Tiffany L Reed, DO  rivastigmine (EXELON) 4.6 mg/24hr Place 1 patch (4.6 mg total) onto the skin daily. Brand medically necessary 06/25/15  Yes Tiffany L Reed, DO  rosuvastatin (CRESTOR) 10 MG tablet Take 1 tablet (10 mg total) by mouth at bedtime. 01/08/15  Yes Tiffany L Reed, DO  Wheat Dextrin (BENEFIBER) POWD Take by mouth. Use 2 table spoons Bid in liquid   Yes Historical Provider, MD  polyethylene glycol powder (GLYCOLAX/MIRALAX) powder Take 17 g by mouth daily as needed for moderate constipation. Patient not taking: Reported on 08/20/2015 02/01/15    Tiffany L Reed, DO  zoster vaccine live, PF, (ZOSTAVAX) 16109 UNT/0.65ML injection Inject 19,400 Units into the skin once. Patient not taking: Reported on 08/20/2015 06/25/15   Kermit Balo, DO    Physical Exam: Filed Vitals:   08/20/15 1900 08/20/15 2002 08/20/15 2039 08/20/15 2151  BP: 148/66 158/69 162/65 123/70  Pulse:  68 72 65  Temp:   98.6 F (37 C) 98.2 F (36.8 C)  TempSrc:   Oral Oral  Resp: Height:    (1.651 m)   Weight:   82.101 kg (181 lb)   SpO2:  98% 100% 100%     General:  Moderately built and nourished.  Eyes: Anicteric mild discharge on the left eye medial aspect.  ENT: No discharge from the ears eyes nose and mouth.  Neck: No mass felt. No neck rigidity.  Cardiovascular: S1 and S2 heard.  Respiratory: No rhonchi or crepitations.  Abdomen: Soft nontender bowel sounds present.  Skin: No rash.  Musculoskeletal: No edema.  Psychiatric: Patient has dementia and is oriented to name.  Neurologic: Patient has dementia and is oriented to her name. Moves all extremities.  Labs on Admission:  Basic Metabolic Panel:  Recent Labs Lab 08/20/15 1519  NA 141  K 4.1  CL 104  CO2 26  GLUCOSE 153*  BUN 20  CREATININE 1.35*  CALCIUM 9.4   Liver Function Tests:  Recent Labs Lab 08/20/15 1519  AST 29  ALT 14  ALKPHOS 52  BILITOT 1.4*  PROT 7.2  ALBUMIN 3.7   No results for input(s): LIPASE, AMYLASE in the last 168 hours. No results for input(s): AMMONIA in the last 168 hours. CBC:  Recent Labs Lab 08/20/15 1519  WBC 14.7*  NEUTROABS 12.6*  HGB 12.7  HCT 39.1  MCV 95.4  PLT 210   Cardiac Enzymes: No results for input(s): CKTOTAL, CKMB, CKMBINDEX, TROPONINI in the last 168 hours.  BNP (last 3 results)  Recent Labs  03/13/15 1216 08/20/15 1709  BNP 262.4* 75.2    ProBNP (last 3 results) No results for input(s): PROBNP in the last 8760 hours.  CBG: No results for input(s): GLUCAP in the last 168  hours.  Radiological Exams on  Admission: Dg Chest 2 View  08/20/2015  CLINICAL DATA:  Productive cough. EXAM: CHEST  2 VIEW COMPARISON:  03/13/2015 FINDINGS: There is peribronchial thickening but there are no infiltrates or effusions. Calcified granuloma at the left lung base posteriorly. No acute osseous abnormality. Fairly severe arthritis of the left glenohumeral joint. Tortuous thoracic aorta. Heart size is normal. IMPRESSION: Slight bronchitic changes. Electronically Signed   By: Francene BoyersJames  Maxwell M.D.   On: 08/20/2015 16:43   Ct Cta Abd/pel W/cm &/or W/o Cm  08/20/2015  CLINICAL DATA:  vasovagal while using bathroom. Hypotensive, clammy. Alert now. Hx dementia, hypotensive on arrival EXAM: CT ANGIOGRAPHY ABDOMEN AND PELVIS TECHNIQUE: Multidetector CT imaging of the abdomen and pelvis was performed using the standard protocol during bolus administration of intravenous contrast. Multiplanar reconstructed images including MIPs were obtained and reviewed to evaluate the vascular anatomy. CONTRAST:  100mL OMNIPAQUE IOHEXOL 350 MG/ML SOLN COMPARISON:  COMPARISON None available FINDINGS: ARTERIAL FINDINGS: Aorta: Mild tortuosity of the visualized distal descending thoracic and abdominal aorta. Scattered atheromatous calcifications. No aneurysm, dissection, or stenosis. Celiac axis:          Patent Superior mesenteric:  Patent, classic distal branch anatomy. Left renal:           Single, patent Right renal: Single ostium, with early bifurcation into anterior and posterior divisions new. There is calcified plaque at the ostium resulting in at least mild short segment stenosis, patent distally. Inferior mesenteric:  Patent Left iliac: Scattered atheromatous calcifications. No aneurysm, dissection, or stenosis. Right iliac: Scattered atheromatous calcifications. No aneurysm, dissection, or stenosis. Venous findings: Dedicated venous phase imaging not obtained. Patent renal veins bilaterally. Circumaortic left renal  vein. Review of the MIP images confirms the above findings. Nonvascular findings: No retroperitoneal hematoma. Minimal dependent atelectasis in the visualized lung bases. Calcified subpleural granuloma in the posterior basal segment left lower lobe. Moderate hiatal hernia involving the gastric fundus. There is a cluster of cysts in hepatic segment 4A measuring up to a 5.7 cm diameter. There is a nonspecific 17 mm low-attenuation lesion in segment 7. Probable cyst lateral to the intrahepatic segment of the IVC. Benign calcification near the cavoatrial junction. Gallbladder is nondilated. Probable exophytic 11 mm cyst from the upper pole left kidney. No hydronephrosis. Stomach and small bowel are nondilated. Innumerable sigmoid diverticula without significant adjacent inflammatory/ edematous change. Urinary bladder physiologically distended. Uterus and adnexal regions unremarkable. Right hip arthroplasty results in streak artifact degrading some of the scan. No ascites. No free air. No adenopathy localized. Thoracolumbar dextroscoliosis with multilevel degenerative changes in the visualized distal thoracic and lumbar spine. IMPRESSION: 1. Negative for aortic aneurysm or other acute vascular finding. 2. Moderate hiatal hernia. 3. Sigmoid diverticulosis 4. Multiple hepatic lesions, most probably cysts, although the lesion in segment 7 is incompletely characterized. In the absence of any outside films to confirm stability, MR liver with contrast may be useful to confirm benignity. 5.  moderate hiatal hernia Electronically Signed   By: Corlis Leak  Hassell M.D.   On: 08/20/2015 20:12    EKG: Independently reviewed. Normal sinus rhythm.  Assessment/Plan Principal Problem:   Hypotension Active Problems:   Dementia   Bipolar I disorder, most recent episode (or current) manic (HCC)   Chronic diastolic heart failure (HCC)   1. Hypotension - cause not clear. Since patient had elevated lactic acid levels and leukocytosis  patient was empirically placed on antibiotics after fluid bolus. Patient's lactic acid levels have improved after fluid. At this time we will hold  off further fluids and also hold Lasix for now. Blood cultures were obtained. Recheck lactic acid levels procalcitonin levels and check cortisol levels.. We will continue with antibiotics until final results of blood cultures are available. Patient chest x-ray did show dramatic changes. But on exam patient has no wheezing. Did have some cough. 2. Chronic diastolic CHF with last year showed showed grade 2 diastolic dysfunction - holding of Lasix due to hypotension. Closely monitor respiratory status. 3. Bipolar disorder - patient's daughter states that patient did not tolerate Seroquel because of liver pathology. And patient was prescribed haloperidol which was not given due to concerns. Closely observe. 4. Hypertension present hypotensive holding of Lasix but we will continue Coreg. 5. Dementia with Lewy body - continue home medications. 6. Since patient had mild discharge in left eye medial aspect I have placed patient on Floxin eye drops.  I have reviewed patient's old charts and labs. Personally reviewed patient's chest x-ray.   DVT Prophylaxis Lovenox.  Code Status: DO NOT RESUSCITATE.  Family Communication: Patient's daughter.  Disposition Plan: Admit to inpatient.    Delina Kruczek N. [facility] Pager [phone].  If 7PM-7AM, please contact night-coverage [email or web address] Password [email or web address] 08/20/2015, 11:48 PM

## 2015-08-20 NOTE — ED Provider Notes (Signed)
CSN: [id number]     Arrival date & time 08/20/15  1350 History   First MD Initiated Contact with Patient 08/20/15 1506     Chief Complaint  Patient presents with  . Near Syncope     (Consider location/radiation/quality/duration/timing/severity/associated sxs/prior Treatment) HPI   Holly Grimes is a 81 y.o. female, with a history of Lewy body dementia, CHF, and orthostatic hypotension, presenting to the ED with difficulty transferring, hypotension. Caregiver called EMS because pt was getting OT at home and during the OT assessment, pt blood pressure was noted to be low. Caregiver states pt became clammy and pale upon standing. Increased episodes of mania that keep pt awake for 36-48 hours. Now when pt comes down off of a manic episode, she will go right back into mania. Currently, pt daughter states pt seems more somnolent than normal and has been this way for about 24 hours. State they have had problems getting her to dr. Tyrell Antonio lately. Pt daughter states her mental status is normal. Pt can answer that she is in a hospital, but could not answer any other orintation questions. Pt has had increase in wet diapers over the last few days and daughter states pt has a history of cutaneous yeast infections. Denies recent illness, fever/chills, shortness of breath, chest pain, LOC, falls, or any other complaints. Accompanied by pt daughter, [name], and home health caregiver, Annice Pih.   Past Medical History  Diagnosis Date  . Hypertension   . High cholesterol   . Mitral valve prolapse   . Dementia   . Parkinson's disease, Lewy body (HCC)   . Unspecified constipation   . Onychia and paronychia of toe   . Spasm of muscle   . Abnormality of gait   . Orthostatic hypotension   . Major depressive disorder, single episode, unspecified (HCC)   . Diaphragmatic hernia without mention of obstruction or gangrene   . Alzheimer's disease   . Osteoarthrosis, unspecified whether generalized or localized, unspecified  site   . Osteoporosis, unspecified   . Unspecified constipation   . Sprain of ribs   . Delirium due to conditions classified elsewhere   . Diarrhea   . Edema   . Pain in joint, pelvic region and thigh   . Other malaise and fatigue   . Urinary frequency [id number]  . Personal history of fall   . Female stress incontinence   . Other and unspecified hyperlipidemia   . Obesity, unspecified   . Osteoarthrosis, unspecified whether generalized or localized, unspecified site   . Pain in joint, shoulder region   . Transient ischemic attack (TIA), and cerebral infarction without residual deficits   . Risk for falls    Past Surgical History  Procedure Laterality Date  . Joint replacement    . Tonsillectomy    . Ankle surgery Left 1998  . Total knee arthroplasty Right 2002  . Total knee arthroplasty Left 2005  . Hip arthroplasty Right 09/24/2013    Procedure: ARTHROPLASTY BIPOLAR HIP;  Surgeon: Loanne Drilling, MD;  Location: [facility];  Service: Orthopedics;  Laterality: Right;   Family History  Problem Relation Age of Onset  . Adopted: Yes   Social History  Substance Use Topics  . Smoking status: Never Smoker   . Smokeless tobacco: Never Used  . Alcohol Use: No   OB History    No data available     Review of Systems  Constitutional: Positive for diaphoresis. Negative for fever, chills and unexpected weight change.  Respiratory: Negative for cough, chest tightness and shortness of breath.   Cardiovascular: Negative for chest pain, palpitations and leg swelling.  Gastrointestinal: Negative for nausea, vomiting, abdominal pain, diarrhea and constipation.  Genitourinary: Negative for dysuria and flank pain.  Musculoskeletal: Negative for back pain.  Skin: Negative for color change and pallor.  Neurological: Positive for weakness. Negative for dizziness, syncope and light-headedness.  All other systems reviewed and are negative.     Allergies  Cephalexin; Sulfa antibiotics; and  Sulfites  Home Medications   Prior to Admission medications   Medication Sig Start Date End Date Taking? Authorizing Provider  acetaminophen (TYLENOL) 650 MG CR tablet Take (2) 650 mg tablets by mouth BID (8 hours formulation)   Yes Historical Provider, MD  albuterol (ACCUNEB) 1.25 MG/3ML nebulizer solution Take 3 mLs (1.25 mg total) by nebulization every 6 (six) hours as needed for wheezing. 01/08/15  Yes Tiffany L Reed, DO  AMBULATORY NON FORMULARY MEDICATION 1. High Low Hospital Bed to allow for safe tranfers 2. Scoop Mattress 3. Bed Alarm and Chair Alarm-to reduce fall risk 4. (2) Halo Bed Rails 5. Fall Mat Dx: G30.9, I95.1, G20, Z91.81 01/05/15  Yes Tiffany L Reed, DO  Ascorbic Acid (VITAMIN C WITH ROSE HIPS) 1000 MG tablet Take 1,000 mg by mouth daily.     Yes Historical Provider, MD  CALCIUM CITRATE-VITAMIN D PO Take 1 tablet by mouth 2 times daily     (500-400 mg )= 1 tablet   Yes Historical Provider, MD  carvedilol (COREG) 3.125 MG tablet TAKE 1 TABLET BY MOUTH TWICE DAILY WITH A MEAL 07/09/15  Yes Tiffany L Reed, DO  Cholecalciferol (VITAMIN D3) 2000 UNITS TABS Take 2,000 Units by mouth daily. 01/08/15  Yes Tiffany L Reed, DO  citalopram (CELEXA) 20 MG tablet TAKE 1 TABLET BY MOUTH ONCE A DAY. 06/27/15  Yes Tiffany L Reed, DO  clopidogrel (PLAVIX) 75 MG tablet Take one tablet by mouth once daily to help circulation 01/08/15  Yes Tiffany L Reed, DO  docusate sodium (COLACE) 100 MG capsule Take 100 mg by mouth at bedtime.   Yes Historical Provider, MD  esomeprazole (NEXIUM) 40 MG capsule Take one capsule once daily for GERD 05/22/15  Yes Tiffany L Reed, DO  feeding supplement (BOOST HIGH PROTEIN) LIQD Take 1 Container by mouth as needed.    Yes Historical Provider, MD  fluticasone (CUTIVATE) 0.05 % cream Apply 1 application topically 2 (two) times daily. To affected areas as needed for skin irritaion 01/08/15  Yes Tiffany L Reed, DO  furosemide (LASIX) 20 MG tablet Take one tablet by mouth once  daily unless weight gain of 3 pounds in one day or 5 pounds in one week  take two tablets by mouth as needed for swelling Patient taking differently: Take 20 mg by mouth daily. Take one tablet by mouth once daily unless weight gain of 3 pounds in one day or 5 pounds in one week  take two tablets by mouth as needed for swelling 01/08/15  Yes Tiffany L Reed, DO  Guaifenesin (MUCINEX MAXIMUM STRENGTH) 1200 MG TB12 Take 1,200 mg by mouth daily.    Yes Historical Provider, MD  haloperidol (HALDOL) 0.5 MG tablet Take one tablet by mouth once daily as needed for agitation/psychosis 01/08/15  Yes Tiffany L Reed, DO  Melatonin 1 MG/4ML LIQD Take 1 mL (0.25 mg total) by mouth at bedtime as needed (insomnia). 02/01/15  Yes Tiffany L Reed, DO  miconazole (LOTRIMIN AF) 2 % powder  Apply topically as needed for itching (use beneath skin folds as needed). 02/01/15  Yes Tiffany L Reed, DO  Multiple Vitamins-Minerals (CENTRUM SILVER ULTRA WOMENS PO) Take 1 tablet by mouth every morning.     Yes Historical Provider, MD  MYRBETRIQ 50 MG TB24 tablet TAKE 1 TABLET BY MOUTH ONCE A DAY FOR BLADDER. 07/05/15  Yes Tiffany L Reed, DO  NAMENDA XR 28 MG CP24 24 hr capsule TAKE 1 CAPSULE BY MOUTH EVERY DAY FOR MEMORY. 06/27/15  Yes Tiffany L Reed, DO  nystatin (MYCOSTATIN/NYSTOP) 100000 UNIT/GM POWD 2 times a day for 2 week to affected area Patient taking differently: Apply 1 g topically 2 (two) times daily as needed (for irration).  06/25/15  Yes Tiffany L Reed, DO  omega-3 acid ethyl esters (LOVAZA) 1 G capsule Take two capsules by mouth twice daily for cholesterol 05/30/15  Yes Tiffany L Reed, DO  Pramox-PE-Glycerin-Petrolatum (PREPARATION H) 1-0.25-14.4-15 % CREA Apply to hemorrhoids as needed for pain or itching 02/01/15  Yes Tiffany L Reed, DO  Probiotic Product (ALIGN) 4 MG CAPS Take 1 capsule by mouth daily. 01/08/15  Yes Tiffany L Reed, DO  rivastigmine (EXELON) 4.6 mg/24hr Place 1 patch (4.6 mg total) onto the skin daily. Brand  medically necessary 06/25/15  Yes Tiffany L Reed, DO  rosuvastatin (CRESTOR) 10 MG tablet Take 1 tablet (10 mg total) by mouth at bedtime. 01/08/15  Yes Tiffany L Reed, DO  Wheat Dextrin (BENEFIBER) POWD Take by mouth. Use 2 table spoons Bid in liquid   Yes Historical Provider, MD  polyethylene glycol powder (GLYCOLAX/MIRALAX) powder Take 17 g by mouth daily as needed for moderate constipation. Patient not taking: Reported on 08/20/2015 02/01/15   Tiffany L Reed, DO  zoster vaccine live, PF, (ZOSTAVAX) 40981 UNT/0.65ML injection Inject 19,400 Units into the skin once. Patient not taking: Reported on 08/20/2015 06/25/15   Tiffany L Reed, DO   BP 162/65 mmHg  Pulse 72  Temp(Src) 98.6 F (37 C) (Oral)  Resp 19  Ht  (1.651 m)  Wt 181 lb (82.101 kg)  BMI 30.12 kg/m2  SpO2 100%  LMP 08/22/2011 Physical Exam  Constitutional: She appears well-developed and well-nourished. No distress.  HENT:  Head: Normocephalic and atraumatic.  Eyes: Conjunctivae are normal. Pupils are equal, round, and reactive to light.  Cardiovascular: Normal rate, regular rhythm and normal heart sounds.   Pulmonary/Chest: Effort normal and breath sounds normal. No respiratory distress.  Abdominal: Soft. Bowel sounds are normal. There is tenderness in the suprapubic area.  Genitourinary:  Area of the left inguinal erythema about the size of two quarters noted without tenderness, signs of cellulitis, and no signs of any other abnormalities. Patient's daughter and caregiver assisted with this exam. Patient does unwilling to allow exam first and became agitated exam was attempted.  Musculoskeletal: She exhibits no edema or tenderness.  Neurological: She is alert. She has normal reflexes.  No sensory deficits. Strength 5/5 in all extremities. Cranial nerves II-XII grossly intact. Pt is oriented to her normal level, which includes knowing her name and knowing generally where she is, such as in the hospital.   Skin: Skin is warm  and dry. She is not diaphoretic.  Nursing note and vitals reviewed.   ED Course  Procedures (including critical care time) Labs Review Labs Reviewed  CBC WITH DIFFERENTIAL/PLATELET - Abnormal; Notable for the following:    WBC 14.7 (*)    Neutro Abs 12.6 (*)    All other components within normal  limits  COMPREHENSIVE METABOLIC PANEL - Abnormal; Notable for the following:    Glucose, Bld 153 (*)    Creatinine, Ser 1.35 (*)    Total Bilirubin 1.4 (*)    GFR calc non Af Amer 35 (*)    GFR calc Af Amer 40 (*)    All other components within normal limits  URINALYSIS, ROUTINE W REFLEX MICROSCOPIC (NOT AT [facility]) - Abnormal; Notable for the following:    APPearance CLOUDY (*)    All other components within normal limits  I-STAT CG4 LACTIC ACID, ED - Abnormal; Notable for the following:    Lactic Acid, Venous 3.99 (*)    All other components within normal limits  URINE CULTURE  CULTURE, BLOOD (ROUTINE X 2)  CULTURE, BLOOD (ROUTINE X 2)  BRAIN NATRIURETIC PEPTIDE  I-STAT TROPOININ, ED  I-STAT CG4 LACTIC ACID, ED    Imaging Review Dg Chest 2 View  08/20/2015  CLINICAL DATA:  Productive cough. EXAM: CHEST  2 VIEW COMPARISON:  03/13/2015 FINDINGS: There is peribronchial thickening but there are no infiltrates or effusions. Calcified granuloma at the left lung base posteriorly. No acute osseous abnormality. Fairly severe arthritis of the left glenohumeral joint. Tortuous thoracic aorta. Heart size is normal. IMPRESSION: Slight bronchitic changes. Electronically Signed   By: Francene Boyers M.D.   On: 08/20/2015 16:43   Ct Cta Abd/pel W/cm &/or W/o Cm  08/20/2015  CLINICAL DATA:  vasovagal while using bathroom. Hypotensive, clammy. Alert now. Hx dementia, hypotensive on arrival EXAM: CT ANGIOGRAPHY ABDOMEN AND PELVIS TECHNIQUE: Multidetector CT imaging of the abdomen and pelvis was performed using the standard protocol during bolus administration of intravenous contrast. Multiplanar  reconstructed images including MIPs were obtained and reviewed to evaluate the vascular anatomy. CONTRAST:  OMNIPAQUE IOHEXOL 350 MG/ML SOLN COMPARISON:  COMPARISON None available FINDINGS: ARTERIAL FINDINGS: Aorta: Mild tortuosity of the visualized distal descending thoracic and abdominal aorta. Scattered atheromatous calcifications. No aneurysm, dissection, or stenosis. Celiac axis:          Patent Superior mesenteric:  Patent, classic distal branch anatomy. Left renal:           Single, patent Right renal: Single ostium, with early bifurcation into anterior and posterior divisions new. There is calcified plaque at the ostium resulting in at least mild short segment stenosis, patent distally. Inferior mesenteric:  Patent Left iliac: Scattered atheromatous calcifications. No aneurysm, dissection, or stenosis. Right iliac: Scattered atheromatous calcifications. No aneurysm, dissection, or stenosis. Venous findings: Dedicated venous phase imaging not obtained. Patent renal veins bilaterally. Circumaortic left renal vein. Review of the MIP images confirms the above findings. Nonvascular findings: No retroperitoneal hematoma. Minimal dependent atelectasis in the visualized lung bases. Calcified subpleural granuloma in the posterior basal segment left lower lobe. Moderate hiatal hernia involving the gastric fundus. There is a cluster of cysts in hepatic segment 4A measuring up to a 5.7 cm diameter. There is a nonspecific 17 mm low-attenuation lesion in segment 7. Probable cyst lateral to the intrahepatic segment of the IVC. Benign calcification near the cavoatrial junction. Gallbladder is nondilated. Probable exophytic 11 mm cyst from the upper pole left kidney. No hydronephrosis. Stomach and small bowel are nondilated. Innumerable sigmoid diverticula without significant adjacent inflammatory/ edematous change. Urinary bladder physiologically distended. Uterus and adnexal regions unremarkable. Right hip  arthroplasty results in streak artifact degrading some of the scan. No ascites. No free air. No adenopathy localized. Thoracolumbar dextroscoliosis with multilevel degenerative changes in the visualized distal thoracic and lumbar spine. IMPRESSION:  1. Negative for aortic aneurysm or other acute vascular finding. 2. Moderate hiatal hernia. 3. Sigmoid diverticulosis 4. Multiple hepatic lesions, most probably cysts, although the lesion in segment 7 is incompletely characterized. In the absence of any outside films to confirm stability, MR liver with contrast may be useful to confirm benignity. 5.  moderate hiatal hernia Electronically Signed   By: Corlis Leak M.D.   On: 08/20/2015 20:12   I have personally reviewed and evaluated these images and lab results as part of my medical decision-making.   EKG Interpretation   Date/Time:  Monday August 20 2015 16:01:54 EST Ventricular Rate:  69 PR Interval:  188 QRS Duration: 88 QT Interval:  427 QTC Calculation: 457 R Axis:   0 Text Interpretation:  Sinus rhythm No significant change since last  tracing Confirmed by YAO  MD, DAVID ([id number]) on 08/20/2015 4:24:34 PM      MDM   Final diagnoses:  Lower abdominal pain  Orthostatic hypotension  SIRS (systemic inflammatory response syndrome) (HCC)    Vidal Schwalbe presents with orthostatic hypotension.   Findings and plan of care discussed with Richardean Canal, MD.  This patient has known history of orthostatic hypotension and confusion from her dementia. Full skin exam revealed no explanation for patient's increased WBC count. 5:12 PM EKG shows sinus rhythm with no significant changes from previous EKG. Chest x-ray shows slight bronchitic changes, but no signs of infiltration or pulmonary edema. Patient has increased WBC count at 14.7 with a left shift and a lactic acid of 4.99. No other abnormalities found on CBC. UA is pending. Blood cultures have been drawn. CMP results are consistent with previous CMP  results. Will obtain an abdominal CT due to lower abdominal tenderness. Expect this patient may have to be admitted, especially with positive SIRS criteria. 8:25 PM CT results show nothing that would explain patient's increased lactate or increased white count. Dr. Silverio Lay will consult the hospitalist for admission and observation. Hospitalist accepted the patient and agreed to admit. Patient and patient's daughter were both updated on this new information.  Anselm Pancoast, PA-C 08/20/15 2043  Richardean Canal, MD 08/21/15 [phone]

## 2015-08-20 NOTE — ED Notes (Signed)
Bed: [id number] Expected date:  Expected time:  Means of arrival:  Comments: Ems- elderly syncope

## 2015-08-21 DIAGNOSIS — I950 Idiopathic hypotension: Secondary | ICD-10-CM

## 2015-08-21 LAB — TROPONIN I
TROPONIN I: 0.03 ng/mL (ref <0.031)
TROPONIN I: 0.04 ng/mL — AB (ref <0.031)
Troponin I: 0.04 ng/mL — ABNORMAL HIGH (ref <0.031)

## 2015-08-21 LAB — PROCALCITONIN: PROCALCITONIN: 0.28 ng/mL

## 2015-08-21 LAB — MRSA PCR SCREENING: MRSA BY PCR: NEGATIVE

## 2015-08-21 LAB — URINE CULTURE: Culture: 1000

## 2015-08-21 LAB — CORTISOL: CORTISOL PLASMA: 8.7 ug/dL

## 2015-08-21 LAB — CK: Total CK: 55 U/L (ref 38–234)

## 2015-08-21 LAB — LACTIC ACID, PLASMA: Lactic Acid, Venous: 0.9 mmol/L (ref 0.5–2.0)

## 2015-08-21 MED ORDER — VANCOMYCIN HCL 10 G IV SOLR
1250.0000 mg | INTRAVENOUS | Status: DC
  Administered 2015-08-21: 1250 mg via INTRAVENOUS
  Filled 2015-08-21: qty 1250

## 2015-08-21 MED ORDER — PIPERACILLIN-TAZOBACTAM 3.375 G IVPB
3.3750 g | Freq: Three times a day (TID) | INTRAVENOUS | Status: DC
  Administered 2015-08-21 – 2015-08-22 (×4): 3.375 g via INTRAVENOUS
  Filled 2015-08-21 (×3): qty 50

## 2015-08-21 MED ORDER — OFLOXACIN 0.3 % OP SOLN
1.0000 [drp] | Freq: Four times a day (QID) | OPHTHALMIC | Status: DC
  Administered 2015-08-21 – 2015-08-22 (×5): 1 [drp] via OPHTHALMIC
  Filled 2015-08-21: qty 5

## 2015-08-21 NOTE — Progress Notes (Signed)
[facility] PROGRESS NOTE  Holly Grimes ZOX:[id number] DOB: 1927-09-03 DOA: 08/20/2015 PCP: Bufford Spikes, DO  Brief narrative: Patient is an 81 year old female with history of dementia with Lewy body, diastolic congestive heart failure grade 2, bipolar disorder who was brought to the ER after being found hypotensive.  Assessment/Plan: Principal Problem:   Hypotension - Etiology uncertain. No source of infection identified. May have been secondary to depleted intravascular volume. Has resolved with IV fluid rehydration.  Lactic acidosis - Resolved with IV fluid rehydration  SIRS - Patient is currently being covered with Zosyn and vancomycin. - Urine culture negative - Blood culture no growth to date - CT angiogram of abdomen and pelvis obtained which were negative for acute vascular findings or aortic aneurysm. Did make mention of multiple hepatic lesions with one unable to be characterized and recommendations to obtain MR liver with contrast to confirm benignity. We'll not obtain MR of the liver at this juncture. This can be performed as an outpatient as I do not think that this is contributing to patient's hypotension.  Active Problems:   Dementia - Stable continue current regimen   Bipolar I disorder, most recent episode (or current) manic (HCC)   Chronic diastolic heart failure (HCC) - Stable, currently compensated  Code Status: DO NOT RESUSCITATE Family Communication: Discussed with daughter. Please update daughter daily Disposition Plan: Pending improvement in condition. And culture results. Will have physical therapy evaluate patient   Consultants:  None  Procedures:  None  Antibiotics:  Vancomycin and Zosyn  HPI/Subjective: Patient has no new complaints. No acute issues overnight  Objective: Filed Vitals:   08/21/15 1500  BP: 119/52  Pulse: 68  Temp: 98.2 F (36.8 C)  Resp: 18    Intake/Output Summary (Last 24 hours) at 08/21/15 1653 Last data  filed at 08/20/15 2001  Gross per 24 hour  Intake   1000 ml  Output      0 ml  Net   1000 ml   Filed Weights   08/20/15 2039 08/21/15 0000  Weight: 82.101 kg (181 lb) 83.915 kg (185 lb)    Exam:   General:  Patient in no acute distress, alert and awake  Cardiovascular: Regular rate and rhythm, no murmurs or rubs  Respiratory: No increased work of breathing, equal chest rise, no wheezes  Abdomen: Soft, nondistended, nontender  Musculoskeletal: No cyanosis or clubbing on limited exam   Data Reviewed: Basic Metabolic Panel:  Recent Labs Lab 08/20/15 1519  NA 141  K 4.1  CL 104  CO2 26  GLUCOSE 153*  BUN 20  CREATININE 1.35*  CALCIUM 9.4   Liver Function Tests:  Recent Labs Lab 08/20/15 1519  AST 29  ALT 14  ALKPHOS 52  BILITOT 1.4*  PROT 7.2  ALBUMIN 3.7   No results for input(s): LIPASE, AMYLASE in the last 168 hours. No results for input(s): AMMONIA in the last 168 hours. CBC:  Recent Labs Lab 08/20/15 1519  WBC 14.7*  NEUTROABS 12.6*  HGB 12.7  HCT 39.1  MCV 95.4  PLT 210   Cardiac Enzymes:  Recent Labs Lab 08/21/15 0010 08/21/15 0842 08/21/15 1432  CKTOTAL 55  --   --   TROPONINI 0.04* 0.04* 0.03   BNP (last 3 results)  Recent Labs  03/13/15 1216 08/20/15 1709  BNP 262.4* 75.2    ProBNP (last 3 results) No results for input(s): PROBNP in the last 8760 hours.  CBG: No results for input(s): GLUCAP in the last 168 hours.  Recent Results (from the past 240 hour(s))  Urine culture     Status: None   Collection Time: 08/20/15  5:00 PM  Result Value Ref Range Status   Specimen Description URINE, CLEAN CATCH  Final   Special Requests NONE  Final   Culture   Final    1,000 COLONIES/mL INSIGNIFICANT GROWTH Performed at [facility]    Report Status 08/21/2015 FINAL  Final  Blood culture (routine x 2)     Status: None (Preliminary result)   Collection Time: 08/20/15  5:03 PM  Result Value Ref Range Status   Specimen  Description BLOOD LEFT WRIST  Final   Special Requests BOTTLES DRAWN AEROBIC AND ANAEROBIC 5CC  Final   Culture   Final    NO GROWTH < 24 HOURS Performed at [facility]    Report Status PENDING  Incomplete  Blood culture (routine x 2)     Status: None (Preliminary result)   Collection Time: 08/20/15  5:06 PM  Result Value Ref Range Status   Specimen Description BLOOD RIGHT ANTECUBITAL  Final   Special Requests BOTTLES DRAWN AEROBIC AND ANAEROBIC 3CC  Final   Culture   Final    NO GROWTH < 24 HOURS Performed at [facility]    Report Status PENDING  Incomplete  MRSA PCR Screening     Status: None   Collection Time: 08/21/15  5:34 AM  Result Value Ref Range Status   MRSA by PCR NEGATIVE NEGATIVE Final    Comment:        The GeneXpert MRSA Assay (FDA approved for NASAL specimens only), is one component of a comprehensive MRSA colonization surveillance program. It is not intended to diagnose MRSA infection nor to guide or monitor treatment for MRSA infections.      Studies: Dg Chest 2 View  08/20/2015  CLINICAL DATA:  Productive cough. EXAM: CHEST  2 VIEW COMPARISON:  03/13/2015 FINDINGS: There is peribronchial thickening but there are no infiltrates or effusions. Calcified granuloma at the left lung base posteriorly. No acute osseous abnormality. Fairly severe arthritis of the left glenohumeral joint. Tortuous thoracic aorta. Heart size is normal. IMPRESSION: Slight bronchitic changes. Electronically Signed   By: Francene BoyersJames  Maxwell M.D.   On: 08/20/2015 16:43   Ct Cta Abd/pel W/cm &/or W/o Cm  08/20/2015  CLINICAL DATA:  vasovagal while using bathroom. Hypotensive, clammy. Alert now. Hx dementia, hypotensive on arrival EXAM: CT ANGIOGRAPHY ABDOMEN AND PELVIS TECHNIQUE: Multidetector CT imaging of the abdomen and pelvis was performed using the standard protocol during bolus administration of intravenous contrast. Multiplanar reconstructed images including MIPs were  obtained and reviewed to evaluate the vascular anatomy. CONTRAST:  100mL OMNIPAQUE IOHEXOL 350 MG/ML SOLN COMPARISON:  COMPARISON None available FINDINGS: ARTERIAL FINDINGS: Aorta: Mild tortuosity of the visualized distal descending thoracic and abdominal aorta. Scattered atheromatous calcifications. No aneurysm, dissection, or stenosis. Celiac axis:          Patent Superior mesenteric:  Patent, classic distal branch anatomy. Left renal:           Single, patent Right renal: Single ostium, with early bifurcation into anterior and posterior divisions new. There is calcified plaque at the ostium resulting in at least mild short segment stenosis, patent distally. Inferior mesenteric:  Patent Left iliac: Scattered atheromatous calcifications. No aneurysm, dissection, or stenosis. Right iliac: Scattered atheromatous calcifications. No aneurysm, dissection, or stenosis. Venous findings: Dedicated venous phase imaging not obtained. Patent renal veins bilaterally. Circumaortic left renal vein. Review  of the MIP images confirms the above findings. Nonvascular findings: No retroperitoneal hematoma. Minimal dependent atelectasis in the visualized lung bases. Calcified subpleural granuloma in the posterior basal segment left lower lobe. Moderate hiatal hernia involving the gastric fundus. There is a cluster of cysts in hepatic segment 4A measuring up to a 5.7 cm diameter. There is a nonspecific 17 mm low-attenuation lesion in segment 7. Probable cyst lateral to the intrahepatic segment of the IVC. Benign calcification near the cavoatrial junction. Gallbladder is nondilated. Probable exophytic 11 mm cyst from the upper pole left kidney. No hydronephrosis. Stomach and small bowel are nondilated. Innumerable sigmoid diverticula without significant adjacent inflammatory/ edematous change. Urinary bladder physiologically distended. Uterus and adnexal regions unremarkable. Right hip arthroplasty results in streak artifact degrading  some of the scan. No ascites. No free air. No adenopathy localized. Thoracolumbar dextroscoliosis with multilevel degenerative changes in the visualized distal thoracic and lumbar spine. IMPRESSION: 1. Negative for aortic aneurysm or other acute vascular finding. 2. Moderate hiatal hernia. 3. Sigmoid diverticulosis 4. Multiple hepatic lesions, most probably cysts, although the lesion in segment 7 is incompletely characterized. In the absence of any outside films to confirm stability, MR liver with contrast may be useful to confirm benignity. 5.  moderate hiatal hernia Electronically Signed   By: Corlis Leak M.D.   On: 08/20/2015 20:12    Scheduled Meds: . acidophilus  1 capsule Oral Daily  . calcium citrate-vitamin D  1 tablet Oral BID  . carvedilol  3.125 mg Oral BID WC  . cholecalciferol  2,000 Units Oral Daily  . citalopram  20 mg Oral Daily  . clopidogrel  75 mg Oral Daily  . docusate sodium  100 mg Oral QHS  . enoxaparin (LOVENOX) injection  40 mg Subcutaneous Q24H  . guaiFENesin  1,200 mg Oral Daily  . lactose free nutrition  1 Container Oral TID WC  . memantine  28 mg Oral Daily  . mirabegron ER  50 mg Oral Daily  . ofloxacin  1 drop Left Eye QID  . omega-3 acid ethyl esters  1 g Oral BID  . pantoprazole  40 mg Oral Daily  . piperacillin-tazobactam (ZOSYN)  IV  3.375 g Intravenous 3 times per day  . rivastigmine  4.6 mg Transdermal Daily  . rosuvastatin  10 mg Oral QHS  . triamcinolone cream  1 application Topical BID  . vancomycin  1,250 mg Intravenous Q24H  . vitamin C with rose hips  1,000 mg Oral Daily   Continuous Infusions:   Time spent: > 35 minutes   Penny Pia  [facility] Pager [phone]. If 7PM-7AM, please contact night-coverage at [email or web address], password [facility] 08/21/2015, 4:53 PM  LOS: 1 day

## 2015-08-21 NOTE — Progress Notes (Signed)
ANTIBIOTIC CONSULT NOTE - INITIAL  Pharmacy Consult for Vancomycin and Zosyn  Indication: rule out sepsis  Allergies  Allergen Reactions  . Cephalexin Nausea And Vomiting  . Sulfa Antibiotics Other (See Comments)    Daughter unsure of reaction, but believes it is severe  . Sulfites     unknown    Patient Measurements: Height:  (165.1 cm) Weight: 185 lb (83.915 kg) IBW/kg (Calculated) : 57 Adjusted Body Weight:   Vital Signs: Temp: 98.1 F (36.7 C) (11/15 0500) Temp Source: Oral (11/15 0500) BP: 153/64 mmHg (11/15 0500) Pulse Rate: 68 (11/15 0500) Intake/Output from previous day: 11/14 0701 - 11/15 0700 In: 1000 [IV Piggyback:1000] Out: -  Intake/Output from this shift: Total I/O In: 1000 [IV Piggyback:1000] Out: -   Labs:  Recent Labs  08/20/15 1519  WBC 14.7*  HGB 12.7  PLT 210  CREATININE 1.35*   Estimated Creatinine Clearance: 32.6 mL/min (by C-G formula based on Cr of 1.35). No results for input(s): VANCOTROUGH, VANCOPEAK, VANCORANDOM, GENTTROUGH, GENTPEAK, GENTRANDOM, TOBRATROUGH, TOBRAPEAK, TOBRARND, AMIKACINPEAK, AMIKACINTROU, AMIKACIN in the last 72 hours.   Microbiology: Recent Results (from the past 720 hour(s))  Blood culture (routine x 2)     Status: None (Preliminary result)   Collection Time: 08/20/15  5:06 PM  Result Value Ref Range Status   Specimen Description   Final    BLOOD RIGHT ANTECUBITAL Performed at [facility]    Special Requests BOTTLES DRAWN AEROBIC AND ANAEROBIC 3CC  Final   Culture PENDING  Incomplete   Report Status PENDING  Incomplete    Medical History: Past Medical History  Diagnosis Date  . Hypertension   . High cholesterol   . Mitral valve prolapse   . Dementia   . Parkinson's disease, Lewy body (HCC)   . Unspecified constipation   . Onychia and paronychia of toe   . Spasm of muscle   . Abnormality of gait   . Orthostatic hypotension   . Major depressive disorder, single episode, unspecified  (HCC)   . Diaphragmatic hernia without mention of obstruction or gangrene   . Alzheimer's disease   . Osteoarthrosis, unspecified whether generalized or localized, unspecified site   . Osteoporosis, unspecified   . Unspecified constipation   . Sprain of ribs   . Delirium due to conditions classified elsewhere   . Diarrhea   . Edema   . Pain in joint, pelvic region and thigh   . Other malaise and fatigue   . Urinary frequency [id number]  . Personal history of fall   . Female stress incontinence   . Other and unspecified hyperlipidemia   . Obesity, unspecified   . Osteoarthrosis, unspecified whether generalized or localized, unspecified site   . Pain in joint, shoulder region   . Transient ischemic attack (TIA), and cerebral infarction without residual deficits   . Risk for falls     Medications:  Anti-infectives    Start     Dose/Rate Route Frequency Ordered Stop   08/21/15 2200  vancomycin (VANCOCIN) 1,250 mg in sodium chloride 0.9 % 250 mL IVPB     1,250 mg 166.7 mL/hr over 90 Minutes Intravenous Every 24 hours 08/21/15 0556     08/21/15 0600  piperacillin-tazobactam (ZOSYN) IVPB 3.375 g     3.375 g 12.5 mL/hr over 240 Minutes Intravenous 3 times per day 08/21/15 0556     08/20/15 2030  vancomycin (VANCOCIN) IVPB 1000 mg/200 mL premix     1,000 mg 200 mL/hr over  60 Minutes Intravenous  Once 08/20/15 2026 08/21/15 0130   08/20/15 2030  piperacillin-tazobactam (ZOSYN) IVPB 3.375 g     3.375 g 100 mL/hr over 30 Minutes Intravenous  Once 08/20/15 2026 08/20/15 2138     Assessment: Patient with hypotension and leukocytosis.  Vancomycin and Zosyn started empirically for rule out sepsis.  First dose of antibiotics already given.   Goal of Therapy:  Vancomycin trough level 15-20 mcg/ml Zosyn based on renal function  Appropriate antibiotic dosing for renal function; eradication of infection  Plan:  Measure antibiotic drug levels at steady state Follow up culture  results Vancomycin 1250mg  iv q24hr (after 1gm in ED)  Zosyn 3.375g IV Q8H infused over 4hrs.   Darlina GuysGrimsley Jr, Jacquenette ShoneJulian Crowford 08/21/2015,5:56 AM

## 2015-08-21 NOTE — Progress Notes (Signed)
Patient refused blood draw to check Troponin levels. PCP was notified.

## 2015-08-22 DIAGNOSIS — I959 Hypotension, unspecified: Secondary | ICD-10-CM

## 2015-08-22 DIAGNOSIS — E860 Dehydration: Secondary | ICD-10-CM

## 2015-08-22 DIAGNOSIS — F3110 Bipolar disorder, current episode manic without psychotic features, unspecified: Secondary | ICD-10-CM

## 2015-08-22 DIAGNOSIS — I5032 Chronic diastolic (congestive) heart failure: Secondary | ICD-10-CM

## 2015-08-22 LAB — PROCALCITONIN: Procalcitonin: 0.22 ng/mL

## 2015-08-22 LAB — BASIC METABOLIC PANEL
ANION GAP: 4 — AB (ref 5–15)
BUN: 15 mg/dL (ref 6–20)
CALCIUM: 9 mg/dL (ref 8.9–10.3)
CO2: 28 mmol/L (ref 22–32)
Chloride: 108 mmol/L (ref 101–111)
Creatinine, Ser: 0.84 mg/dL (ref 0.44–1.00)
GFR calc non Af Amer: >60 mL/min (ref >60)
GLUCOSE: 97 mg/dL (ref 65–99)
Potassium: 3.2 mmol/L — ABNORMAL LOW (ref 3.5–5.1)
SODIUM: 140 mmol/L (ref 135–145)

## 2015-08-22 MED ORDER — POTASSIUM CHLORIDE CRYS ER 20 MEQ PO TBCR
40.0000 meq | EXTENDED_RELEASE_TABLET | Freq: Once | ORAL | Status: AC
  Administered 2015-08-22: 40 meq via ORAL
  Filled 2015-08-22: qty 2

## 2015-08-22 NOTE — Discharge Instructions (Signed)
Hypotension  As your heart beats, it forces blood through your arteries. This force is your blood pressure. If your blood pressure is too low for you to go about your normal activities or to support the organs of your body, you have hypotension. Hypotension is also referred to as low blood pressure. When your blood pressure becomes too low, you may not get enough blood to your brain. As a result, you may feel weak, feel lightheaded, or develop a rapid heart rate. In a more severe case, you may faint.  CAUSES  Various conditions can cause hypotension. These include:  · Blood loss.  · Dehydration.  · Heart or endocrine problems.  · Pregnancy.  · Severe infection.  · Not having a well-balanced diet filled with needed nutrients.  · Severe allergic reactions (anaphylaxis).  Some medicines, such as blood pressure medicine or water pills (diuretics), may lower your blood pressure below normal. Sometimes taking too much medicine or taking medicine not as directed can cause hypotension.  TREATMENT   Hospitalization is sometimes required for hypotension if fluid or blood replacement is needed, if time is needed for medicines to wear off, or if further monitoring is needed. Treatment might include changing your diet, changing your medicines (including medicines aimed at raising your blood pressure), and use of support stockings.  HOME CARE INSTRUCTIONS   · Drink enough fluids to keep your urine clear or pale yellow.  · Take your medicines as directed by your health care provider.  · Get up slowly from reclining or sitting positions. This gives your blood pressure a chance to adjust.  · Wear support stockings as directed by your health care provider.  · Maintain a healthy diet by including nutritious food, such as fruits, vegetables, nuts, whole grains, and lean meats.  SEEK MEDICAL CARE IF:  · You have vomiting or diarrhea.  · You have a fever for more than 2-3 days.  · You feel more thirsty than usual.  · You feel weak and  tired.  SEEK IMMEDIATE MEDICAL CARE IF:   · You have chest pain or a fast or irregular heartbeat.  · You have a loss of feeling in some part of your body, or you lose movement in your arms or legs.  · You have trouble speaking.  · You become sweaty or feel lightheaded.  · You faint.  MAKE SURE YOU:   · Understand these instructions.  · Will watch your condition.  · Will get help right away if you are not doing well or get worse.     This information is not intended to replace advice given to you by your health care provider. Make sure you discuss any questions you have with your health care provider.     Document Released: 09/22/2005 Document Revised: 07/13/2013 Document Reviewed: 03/25/2013  Elsevier Interactive Patient Education ©2016 [employer]

## 2015-08-22 NOTE — Evaluation (Signed)
Physical Therapy Evaluation Patient Details Name: NYASHA RAHILLY MRN: [id number] DOB: [birth year] Today's Date: 08/22/2015   History of Present Illness  81 yo female admitted with syncope. Hx of dementia, orthostatic hypotension, CHF, bipolar d/o. Pt is from [facility]  Clinical Impression  On eval, pt required Min assist for mobility-walked ~60 feet with RW. Pt participated fairly well-required cues and redirection to stay on task. Daughter present-states plan is for pt to return to apt with 24 hour care. Recommend pt continue HHPT.     Follow Up Recommendations Home health PT;Supervision/Assistance - 24 hour    Equipment Recommendations  None recommended by PT    Recommendations for Other Services       Precautions / Restrictions Precautions Precautions: Fall Restrictions Weight Bearing Restrictions: No      Mobility  Bed Mobility               General bed mobility comments: oob in recliner  Transfers Overall transfer level: Needs assistance Equipment used: Rolling walker (2 wheeled) Transfers: Sit to/from Stand Sit to Stand: Min assist         General transfer comment: Assist to rise, stabilize, control descent. Multimodal cues for safety, hand placement  Ambulation/Gait Ambulation/Gait assistance: Min assist Ambulation Distance (Feet): 65 Feet Assistive device: Rolling walker (2 wheeled) Gait Pattern/deviations: Step-through pattern;Decreased stride length;Trunk flexed     General Gait Details: Assist to stabilize pt and maneuver safely with RW. Pt tolerated distance well. c/o some lightheadedness towards end of distance  Stairs            Wheelchair Mobility    Modified Rankin (Stroke Patients Only)       Balance Overall balance assessment: Needs assistance         Standing balance support: Bilateral upper extremity supported;During functional activity Standing balance-Leahy Scale: Poor                                Pertinent Vitals/Pain Pain Assessment: No/denies pain    Home Living Family/patient expects to be discharged to:: Private residence   Available Help at Discharge: Available 24 hours/day;Personal care attendant;Family Type of Home: Independent living facility                Prior Function Level of Independence: Needs assistance   Gait / Transfers Assistance Needed: walked short distances with walker. used wheelchair for longer distances           Hand Dominance        Extremity/Trunk Assessment   Upper Extremity Assessment: Generalized weakness           Lower Extremity Assessment: Generalized weakness      Cervical / Trunk Assessment: Kyphotic  Communication   Communication: HOH  Cognition Arousal/Alertness: Awake/alert Behavior During Therapy: WFL for tasks assessed/performed (some paranoia noted during session) Overall Cognitive Status: History of cognitive impairments - at baseline                      General Comments      Exercises        Assessment/Plan    PT Assessment Patient needs continued PT services  PT Diagnosis Difficulty walking;Generalized weakness   PT Problem List Decreased strength;Decreased activity tolerance;Decreased balance;Decreased mobility;Decreased cognition  PT Treatment Interventions     PT Goals (Current goals can be found in the Care Plan section) Acute Rehab PT Goals Patient Stated  Goal: return to apt per daughter PT Goal Formulation: With family Time For Goal Achievement: 09/05/15 Potential to Achieve Goals: Fair    Frequency     Barriers to discharge        Co-evaluation               End of Session Equipment Utilized During Treatment: Gait belt Activity Tolerance: Patient tolerated treatment well Patient left: in chair;with call bell/phone within reach;with family/visitor present           Time: 1445-1500 PT Time Calculation (min) (ACUTE ONLY): 15 min   Charges:    PT Evaluation $Initial PT Evaluation Tier I: 1 Procedure     PT G Codes:        Rebeca AlertJannie Azelie Noguera, MPT Pager: [phone]

## 2015-08-22 NOTE — Discharge Summary (Addendum)
Physician Discharge Summary  Holly Grimes ZOX:[id number] DOB: 1927-05-18 DOA: 08/20/2015  PCP: Bufford Spikes, DO  Admit date: 08/20/2015 Discharge date: 08/22/2015  Time spent: Greater than 30 minutes  Recommendations for Outpatient Follow-up:  1. Dr. Bufford Spikes, PCP in 5 days with repeat labs (CBC & BMP). Please follow final Blood culture results as outpatient. Please follow-up on hepatic lesions seen on CT abdomen and consider MR liver with contrast as per radiologist's recommendation-to further evaluate.  2. Home health physical therapy.  Discharge Diagnoses:  Principal Problem:   Hypotension Active Problems:   Dementia   Bipolar I disorder, most recent episode (or current) manic (HCC)   Chronic diastolic heart failure (HCC)   Discharge Condition: Improved & Stable  Diet recommendation: Heart healthy diet.  Filed Weights   08/20/15 2039 08/21/15 0000 08/22/15 0644  Weight: 82.101 kg (181 lb) 83.915 kg (185 lb) 83.643 kg (184 lb 6.4 oz)    History of present illness:  81 year old female patient, lives at independent living facility, has 24 x 7 care and supervision, ambulates with the help of a walker, history of HTN, HLD, dementia, Lewy body Parkinsons disease, Major depressive disorder, Alzheimer's dementia, TIA, chronic diastolic CHF and bipolar disorder was brought to the ED on 08/20/15 after she was found to be hypotensive. As per family, she had been lethargic for a few days prior to admission. On the day of admission, occupational therapist checked blood pressure at home and noted SBP in the 60s. In the ED, noted to have elevated lactate, chest x-ray and urine microscopy were negative for features of infection. CT abdomen and pelvis was unremarkable. She improved after IV fluid boluses. She was empirically started on IV antibiotics for possible infectious etiology for her hypotension and elevated lactate. She was hospitalized for further management.  Hospital Course:    Hypotension - Probably related to dehydration from poor oral intake and complicated by medications i.e. carvedilol and Lasix. - Discussed in detail with patient's caregiver Ms Annice Pih at bedside on 11/16 who stated that patient has periods of mania and will stay awake for a couple of days in a stretch followed by excessive sleep for the next couple of days at which time she does not eat or drink much. Similar episode happened prior to admission and patient slept for almost 1. 5-2 days without eating or drinking.  - Resolved after IV fluid hydration. Advised caregiver that they may have to monitor or hold her diuretics when she is not eating or drinking much.  - Low index of suspicion for infectious etiology in the absence of symptoms, negative urine microscopy, negative chest x-ray for pneumonia and cultures thus far negative. Discontinued all antibiotics. Mildly elevated white cell count may be stress response and can be followed with repeat CBC as outpatient in a few days.   Elevated lactate  - Likely from poor perfusion related to hypotension.  Resolved after IV fluid hydration.   Bipolar disorder - Mental status fluctuates as discussed above. As per caregiver, mental status has returned to baseline since yesterday. Continue home medications.  Essential hypertension - Fluctuating and mildly uncontrolled. Continue home medications at discharge.  Alzheimer's dementia/Lewy body Parkinson's disease with behavioral abnormalities - Mental status discussion as above. Continue home medications.  Hyperlipidemia - Continue home statins.  Chronic diastolic CHF  - Compensated. Very minimally elevated troponin-may be secondary to hypotension. No reported chest pain. EKG without acute findings. Troponin normalized. No workup indicated.   Hypokalemia - Replaced prior to  discharge   Multiple hepatic lesions, most probably cysts - Seen on CT abdomen. As per radiology recommendations, consider  outpatient MR liver with contrast to further evaluate.   Consultations:  None  Procedures:  None    Discharge Exam:  Complaints:  patient pleasantly confused and denies complaints. As per caregiver Ms. Annice Pih at bedside, patient has episodes of mania when she is awake for up to of days at a stretch followed by excessive sleep for the next couple of days and she had one such episode prior to admission. She states that her mental status has returned to baseline since yesterday and seems to be now in her manic phase and has not slept much in the hospital.   Filed Vitals:   08/21/15 2200 08/22/15 0552 08/22/15 0644 08/22/15 0830  BP: 151/66 157/97  156/76  Pulse: 75 73  76  Temp: 97.8 F (36.6 C) 98.2 F (36.8 C)  98.1 F (36.7 C)  TempSrc: Oral Oral  Oral  Resp: 16 18  20   Height:      Weight:   83.643 kg (184 lb 6.4 oz)   SpO2: 94% 99%  98%    General exam: pleasant elderly female sitting up comfortably in chair this morning. No abnormalities noted on eye exam and no discharge noted.  Respiratory system: Clear. No increased work of breathing. Cardiovascular system: S1 & S2 heard, RRR. No JVD, murmurs, gallops, clicks or pedal edema. Telemetry: Sinus rhythm.  Gastrointestinal system: Abdomen is nondistended, soft and nontender. Normal bowel sounds heard. Central nervous system: Alert and oriented only to self. No focal neurological deficits. Extremities: Symmetric 5 x 5 power.  Discharge Instructions      Discharge Instructions    (HEART FAILURE PATIENTS) Call MD:  Anytime you have any of the following symptoms: 1) 3 pound weight gain in 24 hours or 5 pounds in 1 week 2) shortness of breath, with or without a dry hacking cough 3) swelling in the hands, feet or stomach 4) if you have to sleep on extra pillows at night in order to breathe.    Complete by:  As directed      Call MD for:  difficulty breathing, headache or visual disturbances    Complete by:  As directed       Call MD for:  extreme fatigue    Complete by:  As directed      Call MD for:  persistant dizziness or light-headedness    Complete by:  As directed      Call MD for:  persistant nausea and vomiting    Complete by:  As directed      Call MD for:  severe uncontrolled pain    Complete by:  As directed      Call MD for:  temperature >100.4    Complete by:  As directed      Diet - low sodium heart healthy    Complete by:  As directed      Increase activity slowly    Complete by:  As directed             Medication List    STOP taking these medications        polyethylene glycol powder powder  Commonly known as:  GLYCOLAX/MIRALAX     zoster vaccine live (PF) 19400 UNT/0.65ML injection  Commonly known as:  ZOSTAVAX      TAKE these medications        acetaminophen 650 MG CR tablet  Commonly known as:  TYLENOL  Take (2) 650 mg tablets by mouth BID (8 hours formulation)     albuterol 1.25 MG/3ML nebulizer solution  Commonly known as:  ACCUNEB  Take 3 mLs (1.25 mg total) by nebulization every 6 (six) hours as needed for wheezing.     ALIGN 4 MG Caps  Take 1 capsule by mouth daily.     AMBULATORY NON FORMULARY MEDICATION  1. High Low Hospital Bed to allow for safe tranfers 2. Scoop Mattress 3. Bed Alarm and Chair Alarm-to reduce fall risk 4. (2) Halo Bed Rails 5. Fall Mat Dx: G30.9, I95.1, G20, Z91.81     BENEFIBER Powd  Take by mouth. Use 2 table spoons Bid in liquid     CALCIUM CITRATE-VITAMIN D PO  Take 1 tablet by mouth 2 times daily     (500-400 mg )= 1 tablet     carvedilol 3.125 MG tablet  Commonly known as:  COREG  TAKE 1 TABLET BY MOUTH TWICE DAILY WITH A MEAL     CENTRUM SILVER ULTRA WOMENS PO  Take 1 tablet by mouth every morning.     citalopram 20 MG tablet  Commonly known as:  CELEXA  TAKE 1 TABLET BY MOUTH ONCE A DAY.     clopidogrel 75 MG tablet  Commonly known as:  PLAVIX  Take one tablet by mouth once daily to help circulation     COLACE 100 MG  capsule  Generic drug:  docusate sodium  Take 100 mg by mouth at bedtime.     esomeprazole 40 MG capsule  Commonly known as:  NEXIUM  Take one capsule once daily for GERD     feeding supplement Liqd  Take 1 Container by mouth as needed.     fluticasone 0.05 % cream  Commonly known as:  CUTIVATE  Apply 1 application topically 2 (two) times daily. To affected areas as needed for skin irritaion     furosemide 20 MG tablet  Commonly known as:  LASIX  Take one tablet by mouth once daily unless weight gain of 3 pounds in one day or 5 pounds in one week  take two tablets by mouth as needed for swelling     haloperidol 0.5 MG tablet  Commonly known as:  HALDOL  Take one tablet by mouth once daily as needed for agitation/psychosis     Melatonin 1 MG/4ML Liqd  Take 1 mL (0.25 mg total) by mouth at bedtime as needed (insomnia).     miconazole 2 % powder  Commonly known as:  LOTRIMIN AF  Apply topically as needed for itching (use beneath skin folds as needed).     MUCINEX MAXIMUM STRENGTH 1200 MG Tb12  Generic drug:  Guaifenesin  Take 1,200 mg by mouth daily.     MYRBETRIQ 50 MG Tb24 tablet  Generic drug:  mirabegron ER  TAKE 1 TABLET BY MOUTH ONCE A DAY FOR BLADDER.     NAMENDA XR 28 MG Cp24 24 hr capsule  Generic drug:  memantine  TAKE 1 CAPSULE BY MOUTH EVERY DAY FOR MEMORY.     nystatin 100000 UNIT/GM Powd  2 times a day for 2 week to affected area     omega-3 acid ethyl esters 1 G capsule  Commonly known as:  LOVAZA  Take two capsules by mouth twice daily for cholesterol     Pramox-PE-Glycerin-Petrolatum 1-0.25-14.4-15 % Crea  Commonly known as:  PREPARATION H  Apply to hemorrhoids as needed for pain or  itching     rivastigmine 4.6 mg/24hr  Commonly known as:  EXELON  Place 1 patch (4.6 mg total) onto the skin daily. Brand medically necessary     rosuvastatin 10 MG tablet  Commonly known as:  CRESTOR  Take 1 tablet (10 mg total) by mouth at bedtime.     vitamin C  with rose hips 1000 MG tablet  Take 1,000 mg by mouth daily.     Vitamin D3 2000 UNITS Tabs  Take 2,000 Units by mouth daily.       Follow-up Information    Follow up with REED, TIFFANY, DO. Schedule an appointment as soon as possible for a visit in 5 days.   Specialty:  Geriatric Medicine   Why:  To be seen with repeat labs (CBC & BMP).   Contact information:   [address] [place] [address] [phone]        The results of significant diagnostics from this hospitalization (including imaging, microbiology, ancillary and laboratory) are listed below for reference.    Significant Diagnostic Studies: Dg Chest 2 View  08/20/2015  CLINICAL DATA:  Productive cough. EXAM: CHEST  2 VIEW COMPARISON:  03/13/2015 FINDINGS: There is peribronchial thickening but there are no infiltrates or effusions. Calcified granuloma at the left lung base posteriorly. No acute osseous abnormality. Fairly severe arthritis of the left glenohumeral joint. Tortuous thoracic aorta. Heart size is normal. IMPRESSION: Slight bronchitic changes. Electronically Signed   By: Francene Boyers M.D.   On: 08/20/2015 16:43   Ct Cta Abd/pel W/cm &/or W/o Cm  08/20/2015  CLINICAL DATA:  vasovagal while using bathroom. Hypotensive, clammy. Alert now. Hx dementia, hypotensive on arrival EXAM: CT ANGIOGRAPHY ABDOMEN AND PELVIS TECHNIQUE: Multidetector CT imaging of the abdomen and pelvis was performed using the standard protocol during bolus administration of intravenous contrast. Multiplanar reconstructed images including MIPs were obtained and reviewed to evaluate the vascular anatomy. CONTRAST:  OMNIPAQUE IOHEXOL 350 MG/ML SOLN COMPARISON:  COMPARISON None available FINDINGS: ARTERIAL FINDINGS: Aorta: Mild tortuosity of the visualized distal descending thoracic and abdominal aorta. Scattered atheromatous calcifications. No aneurysm, dissection, or stenosis. Celiac axis:          Patent Superior mesenteric:  Patent,  classic distal branch anatomy. Left renal:           Single, patent Right renal: Single ostium, with early bifurcation into anterior and posterior divisions new. There is calcified plaque at the ostium resulting in at least mild short segment stenosis, patent distally. Inferior mesenteric:  Patent Left iliac: Scattered atheromatous calcifications. No aneurysm, dissection, or stenosis. Right iliac: Scattered atheromatous calcifications. No aneurysm, dissection, or stenosis. Venous findings: Dedicated venous phase imaging not obtained. Patent renal veins bilaterally. Circumaortic left renal vein. Review of the MIP images confirms the above findings. Nonvascular findings: No retroperitoneal hematoma. Minimal dependent atelectasis in the visualized lung bases. Calcified subpleural granuloma in the posterior basal segment left lower lobe. Moderate hiatal hernia involving the gastric fundus. There is a cluster of cysts in hepatic segment 4A measuring up to a 5.7 cm diameter. There is a nonspecific 17 mm low-attenuation lesion in segment 7. Probable cyst lateral to the intrahepatic segment of the IVC. Benign calcification near the cavoatrial junction. Gallbladder is nondilated. Probable exophytic 11 mm cyst from the upper pole left kidney. No hydronephrosis. Stomach and small bowel are nondilated. Innumerable sigmoid diverticula without significant adjacent inflammatory/ edematous change. Urinary bladder physiologically distended. Uterus and adnexal regions unremarkable. Right hip arthroplasty results in  streak artifact degrading some of the scan. No ascites. No free air. No adenopathy localized. Thoracolumbar dextroscoliosis with multilevel degenerative changes in the visualized distal thoracic and lumbar spine. IMPRESSION: 1. Negative for aortic aneurysm or other acute vascular finding. 2. Moderate hiatal hernia. 3. Sigmoid diverticulosis 4. Multiple hepatic lesions, most probably cysts, although the lesion in segment 7  is incompletely characterized. In the absence of any outside films to confirm stability, MR liver with contrast may be useful to confirm benignity. 5.  moderate hiatal hernia Electronically Signed   By: Corlis Leak M.D.   On: 08/20/2015 20:12    Microbiology: Recent Results (from the past 240 hour(s))  Urine culture     Status: None   Collection Time: 08/20/15  5:00 PM  Result Value Ref Range Status   Specimen Description URINE, CLEAN CATCH  Final   Special Requests NONE  Final   Culture   Final    1,000 COLONIES/mL INSIGNIFICANT GROWTH Performed at [facility]    Report Status 08/21/2015 FINAL  Final  Blood culture (routine x 2)     Status: None (Preliminary result)   Collection Time: 08/20/15  5:03 PM  Result Value Ref Range Status   Specimen Description BLOOD LEFT WRIST  Final   Special Requests BOTTLES DRAWN AEROBIC AND ANAEROBIC 5CC  Final   Culture   Final    NO GROWTH 2 DAYS Performed at [facility]    Report Status PENDING  Incomplete  Blood culture (routine x 2)     Status: None (Preliminary result)   Collection Time: 08/20/15  5:06 PM  Result Value Ref Range Status   Specimen Description BLOOD RIGHT ANTECUBITAL  Final   Special Requests BOTTLES DRAWN AEROBIC AND ANAEROBIC 3CC  Final   Culture   Final    NO GROWTH 2 DAYS Performed at [facility]    Report Status PENDING  Incomplete  MRSA PCR Screening     Status: None   Collection Time: 08/21/15  5:34 AM  Result Value Ref Range Status   MRSA by PCR NEGATIVE NEGATIVE Final    Comment:        The GeneXpert MRSA Assay (FDA approved for NASAL specimens only), is one component of a comprehensive MRSA colonization surveillance program. It is not intended to diagnose MRSA infection nor to guide or monitor treatment for MRSA infections.      Labs: Basic Metabolic Panel:  Recent Labs Lab 08/20/15 1519 08/22/15 0511  NA 141 140  K 4.1 3.2*  CL 104 108  CO2 26 28  GLUCOSE 153* 97   BUN 20 15  CREATININE 1.35* 0.84  CALCIUM 9.4 9.0   Liver Function Tests:  Recent Labs Lab 08/20/15 1519  AST 29  ALT 14  ALKPHOS 52  BILITOT 1.4*  PROT 7.2  ALBUMIN 3.7   No results for input(s): LIPASE, AMYLASE in the last 168 hours. No results for input(s): AMMONIA in the last 168 hours. CBC:  Recent Labs Lab 08/20/15 1519  WBC 14.7*  NEUTROABS 12.6*  HGB 12.7  HCT 39.1  MCV 95.4  PLT 210   Cardiac Enzymes:  Recent Labs Lab 08/21/15 0010 08/21/15 0842 08/21/15 1432  CKTOTAL 55  --   --   TROPONINI 0.04* 0.04* 0.03   BNP: BNP (last 3 results)  Recent Labs  03/13/15 1216 08/20/15 1709  BNP 262.4* 75.2    ProBNP (last 3 results) No results for input(s): PROBNP in the last 8760  hours.  CBG: No results for input(s): GLUCAP in the last 168 hours.   Additional labs: 1.  Pro calcitonin: 0.28 > 0.22  2.  lactate 3.99 > 0.78 > 0.9  3.  CK 55 4. Plasma cortisol: 8.7  Discussed at length with patient's caregiver Ms Annice Pih at bedside and with patient's daughter Ms. Lemmie Evens via phone. Updated care and answered questions.    Signed:  Marcellus Scott, MD, FACP, FHM. [facility] Pager [phone]  If 7PM-7AM, please contact night-coverage [email or web address] Password [email or web address] 08/22/2015, 3:31 PM

## 2015-08-25 LAB — CULTURE, BLOOD (ROUTINE X 2)
CULTURE: NO GROWTH
CULTURE: NO GROWTH

## 2015-08-28 ENCOUNTER — Ambulatory Visit: Payer: Medicare Other | Admitting: Nurse Practitioner

## 2015-08-29 ENCOUNTER — Ambulatory Visit: Payer: Medicare Other | Admitting: Internal Medicine

## 2015-08-29 ENCOUNTER — Other Ambulatory Visit: Payer: Self-pay

## 2015-08-29 ENCOUNTER — Other Ambulatory Visit: Payer: Self-pay | Admitting: Internal Medicine

## 2015-08-29 MED ORDER — ESOMEPRAZOLE MAGNESIUM 40 MG PO CPDR: DELAYED_RELEASE_CAPSULE | ORAL | Status: DC

## 2015-09-05 DIAGNOSIS — M810 Age-related osteoporosis without current pathological fracture: Secondary | ICD-10-CM | POA: Diagnosis not present

## 2015-09-05 DIAGNOSIS — I5032 Chronic diastolic (congestive) heart failure: Secondary | ICD-10-CM | POA: Diagnosis not present

## 2015-09-05 DIAGNOSIS — G20 Parkinson's disease: Secondary | ICD-10-CM | POA: Diagnosis not present

## 2015-09-05 DIAGNOSIS — F319 Bipolar disorder, unspecified: Secondary | ICD-10-CM | POA: Diagnosis not present

## 2015-09-05 DIAGNOSIS — I2510 Atherosclerotic heart disease of native coronary artery without angina pectoris: Secondary | ICD-10-CM | POA: Diagnosis not present

## 2015-09-05 DIAGNOSIS — R260 Ataxic gait: Secondary | ICD-10-CM | POA: Diagnosis not present

## 2015-09-05 DIAGNOSIS — M6281 Muscle weakness (generalized): Secondary | ICD-10-CM | POA: Diagnosis not present

## 2015-09-05 DIAGNOSIS — Z7902 Long term (current) use of antithrombotics/antiplatelets: Secondary | ICD-10-CM | POA: Diagnosis not present

## 2015-09-05 DIAGNOSIS — F0281 Dementia in other diseases classified elsewhere with behavioral disturbance: Secondary | ICD-10-CM | POA: Diagnosis not present

## 2015-09-05 DIAGNOSIS — I950 Idiopathic hypotension: Secondary | ICD-10-CM | POA: Diagnosis not present

## 2015-09-05 DIAGNOSIS — Z9181 History of falling: Secondary | ICD-10-CM | POA: Diagnosis not present

## 2015-09-05 DIAGNOSIS — I110 Hypertensive heart disease with heart failure: Secondary | ICD-10-CM | POA: Diagnosis not present

## 2015-09-28 ENCOUNTER — Other Ambulatory Visit: Payer: Self-pay | Admitting: Internal Medicine

## 2015-10-03 ENCOUNTER — Telehealth: Payer: Self-pay | Admitting: *Deleted

## 2015-10-03 NOTE — Telephone Encounter (Signed)
Mindi JunkerMarsha with [facility] called needing verbal orders to continue OT for 4 weeks. Verbal orders given.

## 2015-10-30 ENCOUNTER — Telehealth: Payer: Self-pay | Admitting: *Deleted

## 2015-10-30 NOTE — Telephone Encounter (Signed)
Mindi Junker with [facility] called requesting an order to be written with [facility] and faxed to [facility] Fax#: [phone] for a Standard Wheelchair with gel cushion and elevated leg rest. Please Advise.

## 2015-10-31 NOTE — Telephone Encounter (Signed)
Fine with me. If you print this or write it out, I will sign it in the morning tomorrow in the office.

## 2015-11-01 MED ORDER — AMBULATORY NON FORMULARY MEDICATION: Status: DC

## 2015-11-01 NOTE — Telephone Encounter (Signed)
Printed and given to Dr. Renato Gails to review and sign. Fax cover sheet attached to fax to [facility] once signed.

## 2015-11-04 DIAGNOSIS — Z9181 History of falling: Secondary | ICD-10-CM | POA: Diagnosis not present

## 2015-11-04 DIAGNOSIS — G20 Parkinson's disease: Secondary | ICD-10-CM | POA: Diagnosis not present

## 2015-11-04 DIAGNOSIS — Z96653 Presence of artificial knee joint, bilateral: Secondary | ICD-10-CM | POA: Diagnosis not present

## 2015-11-04 DIAGNOSIS — F0281 Dementia in other diseases classified elsewhere with behavioral disturbance: Secondary | ICD-10-CM | POA: Diagnosis not present

## 2015-11-04 DIAGNOSIS — M6281 Muscle weakness (generalized): Secondary | ICD-10-CM | POA: Diagnosis not present

## 2015-11-04 DIAGNOSIS — I950 Idiopathic hypotension: Secondary | ICD-10-CM | POA: Diagnosis not present

## 2015-11-04 DIAGNOSIS — Z7902 Long term (current) use of antithrombotics/antiplatelets: Secondary | ICD-10-CM | POA: Diagnosis not present

## 2015-11-04 DIAGNOSIS — I5032 Chronic diastolic (congestive) heart failure: Secondary | ICD-10-CM | POA: Diagnosis not present

## 2015-11-04 DIAGNOSIS — M810 Age-related osteoporosis without current pathological fracture: Secondary | ICD-10-CM | POA: Diagnosis not present

## 2015-11-04 DIAGNOSIS — F319 Bipolar disorder, unspecified: Secondary | ICD-10-CM | POA: Diagnosis not present

## 2015-11-04 DIAGNOSIS — R260 Ataxic gait: Secondary | ICD-10-CM | POA: Diagnosis not present

## 2015-11-04 DIAGNOSIS — I2510 Atherosclerotic heart disease of native coronary artery without angina pectoris: Secondary | ICD-10-CM | POA: Diagnosis not present

## 2015-11-04 DIAGNOSIS — I110 Hypertensive heart disease with heart failure: Secondary | ICD-10-CM | POA: Diagnosis not present

## 2015-11-04 DIAGNOSIS — Z96641 Presence of right artificial hip joint: Secondary | ICD-10-CM | POA: Diagnosis not present

## 2015-11-06 ENCOUNTER — Ambulatory Visit (INDEPENDENT_AMBULATORY_CARE_PROVIDER_SITE_OTHER): Payer: Medicare Other | Admitting: Internal Medicine

## 2015-11-06 ENCOUNTER — Other Ambulatory Visit: Payer: Self-pay

## 2015-11-06 ENCOUNTER — Encounter: Payer: Self-pay | Admitting: Internal Medicine

## 2015-11-06 ENCOUNTER — Other Ambulatory Visit: Payer: Self-pay | Admitting: *Deleted

## 2015-11-06 VITALS — BP 140/80 | HR 77 | Temp 98.9°F | Resp 20 | Ht 65.0 in | Wt 186.8 lb

## 2015-11-06 DIAGNOSIS — R410 Disorientation, unspecified: Secondary | ICD-10-CM

## 2015-11-06 DIAGNOSIS — I10 Essential (primary) hypertension: Secondary | ICD-10-CM

## 2015-11-06 DIAGNOSIS — F0390 Unspecified dementia without behavioral disturbance: Secondary | ICD-10-CM | POA: Diagnosis not present

## 2015-11-06 DIAGNOSIS — R350 Frequency of micturition: Secondary | ICD-10-CM

## 2015-11-06 DIAGNOSIS — N3000 Acute cystitis without hematuria: Secondary | ICD-10-CM

## 2015-11-06 DIAGNOSIS — N390 Urinary tract infection, site not specified: Secondary | ICD-10-CM | POA: Insufficient documentation

## 2015-11-06 MED ORDER — NITROFURANTOIN MACROCRYSTAL 50 MG PO CAPS: ORAL_CAPSULE | ORAL | Status: DC

## 2015-11-06 NOTE — Telephone Encounter (Signed)
[facility] 

## 2015-11-06 NOTE — Addendum Note (Signed)
Addended by: Lamont Snowball on: 11/06/2015 02:41 PM   Modules accepted: Orders

## 2015-11-06 NOTE — Progress Notes (Addendum)
Patient ID: Holly Grimes, female   DOB: February 02, 1927, 81 y.o.   MRN: [id number]    Facility  [name]    Place of Service:   OFFICE    Allergies  Allergen Reactions  . Cephalexin Nausea And Vomiting  . Sulfa Antibiotics Other (See Comments)    Daughter unsure of reaction, but believes it is severe  . Sulfites     unknown    Chief Complaint  Patient presents with  . Medical Management of Chronic Issues    urine cloudy, lower back pain,    HPI:  24 hour caregivers at [facility]. Cloudy urine and urgency. Also some low back pain. Change in appetite. Getting too much orange juice per daughter.  Takes Tyenol every 8 hours.  Medications: Patient's Medications  New Prescriptions   No medications on file  Previous Medications   ACETAMINOPHEN (TYLENOL) 650 MG CR TABLET    Take (2) 650 mg tablets by mouth BID (8 hours formulation)   ALBUTEROL (ACCUNEB) 1.25 MG/3ML NEBULIZER SOLUTION    Take 3 mLs (1.25 mg total) by nebulization every 6 (six) hours as needed for wheezing.   AMBULATORY NON FORMULARY MEDICATION    1. [place] [facility] Bed to allow for safe tranfers 2. Scoop Mattress 3. Bed Alarm and Chair Alarm-to reduce fall risk 4. (2) Halo Bed Rails 5. Fall Mat Dx: G30.9, I95.1, G20, Z91.81   AMBULATORY NON FORMULARY MEDICATION    Standard Wheelchair with Gel Cushion with Elevated leg rest. Dx: G20, Z91.81   ASCORBIC ACID (VITAMIN C WITH ROSE HIPS) 1000 MG TABLET    Take 1,000 mg by mouth daily.     CALCIUM CITRATE-VITAMIN D PO    Take 1 tablet by mouth 2 times daily     (500-400 mg )= 1 tablet   CARVEDILOL (COREG) 3.125 MG TABLET    TAKE 1 TABLET BY MOUTH TWICE DAILY WITH A MEAL   CHOLECALCIFEROL (VITAMIN D3) 2000 UNITS TABS    Take 2,000 Units by mouth daily.   CITALOPRAM (CELEXA) 20 MG TABLET    TAKE 1 TABLET BY MOUTH ONCE A DAY.   CLOPIDOGREL (PLAVIX) 75 MG TABLET    TAKE 1 TABLET BY MOUTH ONCE A DAY TO HELP CIRCULATION.   DOCUSATE SODIUM (COLACE) 100 MG CAPSULE    Take  100 mg by mouth at bedtime.   ESOMEPRAZOLE (NEXIUM) 40 MG CAPSULE    Take one capsule once daily for GERD   EXELON 4.6 MG/24HR    APPLY 1 PATCH TO SKIN DAILY.REMOVE USED PATCH.   FEEDING SUPPLEMENT (BOOST HIGH PROTEIN) LIQD    Take 1 Container by mouth as needed.    FLUTICASONE (CUTIVATE) 0.05 % CREAM    Apply 1 application topically 2 (two) times daily. To affected areas as needed for skin irritaion   FUROSEMIDE (LASIX) 20 MG TABLET    Take one tablet by mouth once daily unless weight gain of 3 pounds in one day or 5 pounds in one week  take two tablets by mouth as needed for swelling   GUAIFENESIN (MUCINEX MAXIMUM STRENGTH) 1200 MG TB12    Take 1,200 mg by mouth daily. Reported on 11/06/2015   HALOPERIDOL (HALDOL) 0.5 MG TABLET    Take one tablet by mouth once daily as needed for agitation/psychosis   MELATONIN 1 MG/4ML LIQD    Take 1 mL (0.25 mg total) by mouth at bedtime as needed (insomnia).   MICONAZOLE (LOTRIMIN AF) 2 % POWDER  Apply topically as needed for itching (use beneath skin folds as needed).   MULTIPLE VITAMINS-MINERALS (CENTRUM SILVER ULTRA WOMENS PO)    Take 1 tablet by mouth every morning.     MYRBETRIQ 50 MG TB24 TABLET    TAKE 1 TABLET BY MOUTH ONCE A DAY FOR BLADDER.   NAMENDA XR 28 MG CP24 24 HR CAPSULE    TAKE 1 CAPSULE BY MOUTH EVERY DAY FOR MEMORY.   NYSTATIN (MYCOSTATIN/NYSTOP) 100000 UNIT/GM POWD    2 times a day for 2 week to affected area   OMEGA-3 ACID ETHYL ESTERS (LOVAZA) 1 G CAPSULE    Take two capsules by mouth twice daily for cholesterol   PANTOPRAZOLE (PROTONIX) 40 MG TABLET       PRAMOX-PE-GLYCERIN-PETROLATUM (PREPARATION H) 1-0.25-14.4-15 % CREA    Apply to hemorrhoids as needed for pain or itching   PROBIOTIC PRODUCT (ALIGN) 4 MG CAPS    Take 1 capsule by mouth daily.   ROSUVASTATIN (CRESTOR) 10 MG TABLET    TAKE (1) TABLET BY MOUTH AT BEDTIME.   WHEAT DEXTRIN (BENEFIBER) POWD    Take by mouth. Use 2 table spoons Bid in liquid  Modified Medications   No  medications on file  Discontinued Medications   No medications on file    Review of Systems  Constitutional: Positive for appetite change. Negative for fever and chills.  HENT: Positive for hearing loss. Negative for congestion.   Eyes: Positive for visual disturbance (corrective lenses).       Glasses  Respiratory: Negative for shortness of breath.   Cardiovascular: Positive for leg swelling. Negative for chest pain.       Chronic edema  Gastrointestinal: Negative for abdominal pain, constipation and blood in stool.  Genitourinary: Positive for urgency. Negative for dysuria and frequency.       Incontinent at times  Musculoskeletal: Positive for back pain, arthralgias (shoulders) and gait problem.  Skin: Negative for rash.  Neurological: Positive for weakness. Negative for dizziness.       Dementia, Lewy bodya  Hematological: Does not bruise/bleed easily.  Psychiatric/Behavioral: Positive for behavioral problems (intermittently) and decreased concentration. The patient is nervous/anxious.     Filed Vitals:   11/06/15 1332  BP: 140/80  Pulse: 77  Temp: 98.9 F (37.2 C)  TempSrc: Oral  Resp: 20  Height: '5\' 5"'$  (1.651 m)  Weight: 186 lb 12.8 oz (84.732 kg)  SpO2: 95%   Body mass index is 31.09 kg/(m^2). Filed Weights   11/06/15 1332  Weight: 186 lb 12.8 oz (84.732 kg)     Physical Exam  Constitutional: She appears well-developed and well-nourished. No distress.  obese  HENT:  Head: Normocephalic and atraumatic.  Cardiovascular: Normal rate, regular rhythm, normal heart sounds and intact distal pulses.   Pulmonary/Chest: Effort normal and breath sounds normal. She has no rales.  Abdominal: Soft. Bowel sounds are normal. She exhibits no distension. There is no tenderness.  Musculoskeletal: Normal range of motion. She exhibits edema and tenderness (low back with percussion).  Neurological: She is alert. No cranial nerve deficit. Coordination normal.  Lethargic today,  falling asleep some during visit  Skin: Skin is warm and dry. No rash noted. No erythema. No pallor.  Psychiatric: She has a normal mood and affect. Her behavior is normal.    Labs reviewed: Lab Summary Latest Ref Rng 08/22/2015 08/20/2015 05/14/2015 03/13/2015  Hemoglobin 12.0 - 15.0 g/dL (None) 12.7 12.3 11.4  Hematocrit 36.0 - 46.0 % (None) 39.1 37.1 34.4  White count 4.0 - 10.5 K/uL (None) 14.7(H) 5.9 6.4  Platelet count 150 - 400 K/uL (None) 210 245 229  Sodium 135 - 145 mmol/L 140 141 139 145(H)  Potassium 3.5 - 5.1 mmol/L 3.2(L) 4.1 4.8 4.2  Calcium 8.9 - 10.3 mg/dL 9.0 9.4 9.4 9.2  Phosphorus - (None) (None) (None) (None)  Creatinine 0.44 - 1.00 mg/dL 0.84 1.35(H) 0.90 0.87  AST 15 - 41 U/L (None) 29 (None) (None)  Alk Phos 38 - 126 U/L (None) 52 (None) (None)  Bilirubin 0.3 - 1.2 mg/dL (None) 1.4(H) (None) (None)  Glucose 65 - 99 mg/dL 97 153(H) 92 96  Cholesterol - (None) (None) (None) (None)  HDL cholesterol - (None) (None) (None) (None)  Triglycerides - (None) (None) (None) (None)  LDL Direct - (None) (None) (None) (None)  LDL Calc - (None) (None) (None) (None)  Total protein 6.5 - 8.1 g/dL (None) 7.2 (None) (None)  Albumin 3.5 - 5.0 g/dL (None) 3.7 (None) (None)   No results found for: TSH, T3TOTAL, T4TOTAL, THYROIDAB Lab Results  Component Value Date   BUN 15 08/22/2015   BUN 20 08/20/2015   BUN 13 05/14/2015   No results found for: HGBA1C  Assessment/Plan  1. Acute cystitis without hematuria - CBC With Differential; Future - nitrofurantoin (MACRODANTIN) 50 MG capsule; One nightly to help prevent urine infections  Dispense: 30 capsule; Refill: 5  2. Urinary frequency Due to infection  3. Confusion Related to dementia  4. Dementia, without behavioral disturbance Lewt body  5. Essential hypertension - Comprehensive metabolic panel; Future   At the end of the visit, daughter asks for approval of reclining wheelchair with high back for support of head and  foot rests due to edema. prescription was written.  She is a fall risk, has edema, and no longer is abe to walk independently. Needs assistance in getting around in wheelchair due to shoulder discomfort and weak upper body strength.

## 2015-11-06 NOTE — Addendum Note (Signed)
Addended by: Kimber Relic on: 11/06/2015 02:24 PM   Modules accepted: Orders

## 2015-11-07 ENCOUNTER — Telehealth: Payer: Self-pay | Admitting: *Deleted

## 2015-11-07 ENCOUNTER — Other Ambulatory Visit: Payer: Self-pay | Admitting: Internal Medicine

## 2015-11-07 LAB — COMPREHENSIVE METABOLIC PANEL
ALBUMIN: 4 g/dL (ref 3.5–4.7)
ALT: 14 IU/L (ref 0–32)
AST: 23 IU/L (ref 0–40)
Albumin/Globulin Ratio: 1.4 (ref 1.1–2.5)
Alkaline Phosphatase: 59 IU/L (ref 39–117)
BILIRUBIN TOTAL: 0.4 mg/dL (ref 0.0–1.2)
BUN / CREAT RATIO: 15 (ref 11–26)
BUN: 13 mg/dL (ref 8–27)
CALCIUM: 9.5 mg/dL (ref 8.7–10.3)
CHLORIDE: 101 mmol/L (ref 96–106)
CO2: 24 mmol/L (ref 18–29)
CREATININE: 0.84 mg/dL (ref 0.57–1.00)
GFR, EST AFRICAN AMERICAN: 73 mL/min/{1.73_m2} (ref >59)
GFR, EST NON AFRICAN AMERICAN: 63 mL/min/{1.73_m2} (ref >59)
GLUCOSE: 88 mg/dL (ref 65–99)
Globulin, Total: 2.9 g/dL (ref 1.5–4.5)
Potassium: 4.3 mmol/L (ref 3.5–5.2)
Sodium: 140 mmol/L (ref 134–144)
TOTAL PROTEIN: 6.9 g/dL (ref 6.0–8.5)

## 2015-11-07 LAB — CBC WITH DIFFERENTIAL/PLATELET
BASOS ABS: 0 10*3/uL (ref 0.0–0.2)
BASOS: 1 %
EOS (ABSOLUTE): 0.3 10*3/uL (ref 0.0–0.4)
Eos: 4 %
HEMOGLOBIN: 12.3 g/dL (ref 11.1–15.9)
Hematocrit: 36.5 % (ref 34.0–46.6)
IMMATURE GRANS (ABS): 0 10*3/uL (ref 0.0–0.1)
Immature Granulocytes: 0 %
LYMPHS: 23 %
Lymphocytes Absolute: 1.5 10*3/uL (ref 0.7–3.1)
MCH: 31.5 pg (ref 26.6–33.0)
MCHC: 33.7 g/dL (ref 31.5–35.7)
MCV: 93 fL (ref 79–97)
MONOCYTES: 10 %
Monocytes Absolute: 0.6 10*3/uL (ref 0.1–0.9)
NEUTROS ABS: 4.1 10*3/uL (ref 1.4–7.0)
Neutrophils: 62 %
Platelets: 230 10*3/uL (ref 150–379)
RBC: 3.91 x10E6/uL (ref 3.77–5.28)
RDW: 14.2 % (ref 12.3–15.4)
WBC: 6.6 10*3/uL (ref 3.4–10.8)

## 2015-11-07 MED ORDER — NITROFURANTOIN MACROCRYSTAL 100 MG PO CAPS: ORAL_CAPSULE | ORAL | Status: DC

## 2015-11-07 MED ORDER — NITROFURANTOIN MONOHYD MACRO 100 MG PO CAPS
100.0000 mg | ORAL_CAPSULE | Freq: Two times a day (BID) | ORAL | Status: DC
Start: 2015-11-07 — End: 2015-11-12

## 2015-11-07 NOTE — Telephone Encounter (Signed)
Patient daughter notified and agreed.  

## 2015-11-07 NOTE — Telephone Encounter (Signed)
She is correct. I must have clicked the wrong drug button. New Rx sent for 100 mg bid Macrodantin x 7 days

## 2015-11-07 NOTE — Addendum Note (Signed)
Addended by: Nelda Severe A on: 11/07/2015 12:31 PM   Modules accepted: Orders, Medications

## 2015-11-07 NOTE — Telephone Encounter (Signed)
Robin with [facility] called and wants Clarification on Medication. States that Macrodantin  is usually given four times daily not twice daily. She wants to know if you were meaning to give Macrobid that is given twice daily? Please Advise.

## 2015-11-07 NOTE — Telephone Encounter (Signed)
It is OK to switch to [employer]

## 2015-11-07 NOTE — Telephone Encounter (Signed)
Patient daughter, [name] called and wanted to know if Dr. Chilton Si meant to give her the Macrodantin  once daily to treat her UTI she has now. The pharmacist told her that usually it is stronger to treat a UTI. Daughter was just curious with it saying preventive, since you did say you felt confident she had a UTI.  Please Advise.

## 2015-11-07 NOTE — Telephone Encounter (Signed)
Medication updated and pharmacy called.

## 2015-11-08 ENCOUNTER — Telehealth: Payer: Self-pay

## 2015-11-08 LAB — URINE CULTURE

## 2015-11-08 NOTE — Telephone Encounter (Signed)
Message left on triage voicemail: Needs face-to-face OV notes for wheelchair and gel cushion. Fax to [phone]   Spoke with receptionist at [facility], patient needs a face-to-face encounter the same day rx written or shortly after. OV notes should reflect the guidelines per the insurance company (paper will be faxed with guidelines). I informed receptionist that we will have to contact patient for an appointment.  Spoke with patient's daughter, appointment scheduled for Monday.

## 2015-11-12 ENCOUNTER — Encounter: Payer: Self-pay | Admitting: Internal Medicine

## 2015-11-12 ENCOUNTER — Ambulatory Visit (INDEPENDENT_AMBULATORY_CARE_PROVIDER_SITE_OTHER): Payer: Medicare Other | Admitting: Internal Medicine

## 2015-11-12 VITALS — BP 122/78 | HR 85 | Temp 98.8°F | Ht 65.0 in | Wt 184.0 lb

## 2015-11-12 DIAGNOSIS — G3183 Dementia with Lewy bodies: Secondary | ICD-10-CM

## 2015-11-12 DIAGNOSIS — N3000 Acute cystitis without hematuria: Secondary | ICD-10-CM

## 2015-11-12 DIAGNOSIS — I5032 Chronic diastolic (congestive) heart failure: Secondary | ICD-10-CM | POA: Diagnosis not present

## 2015-11-12 DIAGNOSIS — F0280 Dementia in other diseases classified elsewhere without behavioral disturbance: Secondary | ICD-10-CM

## 2015-11-12 DIAGNOSIS — I959 Hypotension, unspecified: Secondary | ICD-10-CM

## 2015-11-12 MED ORDER — CIPROFLOXACIN HCL 500 MG PO TABS: 500.0000 mg | ORAL_TABLET | Freq: Two times a day (BID) | ORAL | Status: DC

## 2015-11-12 NOTE — Patient Instructions (Addendum)
Eat yogurt daily while you are taking cipro antibiotics.  Hold coreg for BP<110.

## 2015-11-12 NOTE — Progress Notes (Signed)
Patient ID: Holly Grimes, female   DOB: Mar 17, 1927, 82 y.o.   MRN: [id number]   Location: [facility] Provider: Gwenith Spitz. Renato Gails, D.O., C.M.D.  Code Status: DNR Goals of Care: Advanced Directive information Does patient have an advance directive?: Yes, Type of Advance Directive: Healthcare Power of [place];Out of facility DNR (pink MOST or yellow form)  Chief Complaint  Patient presents with  . Face to Face    for manual reclining wheelchair, high back for head support, bilateral foot rest, gel cushion. Here with daughter [name]    HPI: Patient is a 82 y.o. female seen in the office today for face to face visit to get her her wheelchair.  Actually, was already seen and Rx written, but last provider did not document thoroughly and now notes must be sent along with prescription.  OT was seeing her due to difficulties with transfers.  She has a transport chair now.  She had a UTI that was started on treatment last week.  Reclining chair would be helpful due to her CHF and difficulty with transfers. Her daughter is thinking of now getting a sit to stand lift now b/c she is falling during transfers.  Her proximal muscle weakness is too severe.  See my a/p for more details on her reason for wheelchair.  She is on tylenol 8 hr.  Temp still 98.8.  She remains lethargic.  Had cloudy, foul smelling urine.  Yellow lysol in toilet was a problem.  Urine turned out to be milky.   Review of Systems:  Review of Systems  Constitutional: Positive for fever and malaise/fatigue. Negative for chills.  HENT: Positive for hearing loss.   Eyes: Negative for blurred vision.       Glasses  Respiratory: Negative for shortness of breath.   Cardiovascular: Positive for leg swelling. Negative for chest pain.  Gastrointestinal: Negative for abdominal pain, constipation, blood in stool and melena.  Genitourinary: Positive for dysuria, urgency and frequency.       Incontinence, OAB  Musculoskeletal: Positive  for falls.  Neurological: Positive for dizziness and weakness. Negative for loss of consciousness.  Psychiatric/Behavioral: Positive for depression, hallucinations and memory loss.    Past Medical History  Diagnosis Date  . Hypertension   . High cholesterol   . Mitral valve prolapse   . Dementia   . Parkinson's disease, Lewy body (HCC)   . Unspecified constipation   . Onychia and paronychia of toe   . Spasm of muscle   . Abnormality of gait   . Orthostatic hypotension   . Major depressive disorder, single episode, unspecified (HCC)   . Diaphragmatic hernia without mention of obstruction or gangrene   . Alzheimer's disease   . Osteoarthrosis, unspecified whether generalized or localized, unspecified site   . Osteoporosis, unspecified   . Unspecified constipation   . Sprain of ribs   . Delirium due to conditions classified elsewhere   . Diarrhea   . Edema   . Pain in joint, pelvic region and thigh   . Other malaise and fatigue   . Urinary frequency [id number]  . Personal history of fall   . Female stress incontinence   . Other and unspecified hyperlipidemia   . Obesity, unspecified   . Osteoarthrosis, unspecified whether generalized or localized, unspecified site   . Pain in joint, shoulder region   . Transient ischemic attack (TIA), and cerebral infarction without residual deficits   . Risk for falls     Past Surgical History  Procedure Laterality Date  . Joint replacement    . Tonsillectomy    . Ankle surgery Left 1998  . Total knee arthroplasty Right 2002  . Total knee arthroplasty Left 2005  . Hip arthroplasty Right 09/24/2013    Procedure: ARTHROPLASTY BIPOLAR HIP;  Surgeon: Loanne Drilling, MD;  Location: [facility];  Service: Orthopedics;  Laterality: Right;    Allergies  Allergen Reactions  . Cephalexin Nausea And Vomiting  . Sulfa Antibiotics Other (See Comments)    Daughter unsure of reaction, but believes it is severe  . Sulfites     unknown        Medication List       This list is accurate as of: 11/12/15 11:59 PM.  Always use your most recent med list.               acetaminophen 650 MG CR tablet  Commonly known as:  TYLENOL  Take (2) 650 mg tablets by mouth BID (8 hours formulation)     albuterol 1.25 MG/3ML nebulizer solution  Commonly known as:  ACCUNEB  Take 3 mLs (1.25 mg total) by nebulization every 6 (six) hours as needed for wheezing.     ALIGN 4 MG Caps  Take 1 capsule by mouth daily.     AMBULATORY NON FORMULARY MEDICATION  1. [place] Hospital Bed to allow for safe tranfers 2. Scoop Mattress 3. Bed Alarm and Chair Alarm-to reduce fall risk 4. (2) Halo Bed Rails 5. Fall Mat Dx: G30.9, I95.1, G20, Z91.81     AMBULATORY NON FORMULARY MEDICATION  Standard Wheelchair with Gel Cushion with Elevated leg rest. Dx: G20, Z91.81     BENEFIBER Powd  Take by mouth. Use 2 table spoons Bid in liquid     CALCIUM CITRATE-VITAMIN D PO  Take 1 tablet by mouth 2 times daily     (500-400 mg )= 1 tablet     carvedilol 3.125 MG tablet  Commonly known as:  COREG  TAKE 1 TABLET BY MOUTH TWICE DAILY WITH A MEAL     CENTRUM SILVER ULTRA WOMENS PO  Take 1 tablet by mouth every morning.     ciprofloxacin 500 MG tablet  Commonly known as:  CIPRO  Take 1 tablet (500 mg total) by mouth 2 (two) times daily.     citalopram 20 MG tablet  Commonly known as:  CELEXA  TAKE 1 TABLET BY MOUTH ONCE A DAY.     clopidogrel 75 MG tablet  Commonly known as:  PLAVIX  TAKE 1 TABLET BY MOUTH ONCE A DAY TO HELP CIRCULATION.     COLACE 100 MG capsule  Generic drug:  docusate sodium  Take 100 mg by mouth at bedtime.     esomeprazole 40 MG capsule  Commonly known as:  NEXIUM  Take one capsule once daily for GERD     EXELON 4.6 mg/24hr  Generic drug:  rivastigmine  APPLY 1 PATCH TO SKIN DAILY.REMOVE USED PATCH.     feeding supplement Liqd  Take 1 Container by mouth as needed.     fluticasone 0.05 % cream  Commonly known as:   CUTIVATE  Apply 1 application topically 2 (two) times daily. To affected areas as needed for skin irritaion     furosemide 20 MG tablet  Commonly known as:  LASIX  Take one tablet by mouth once daily unless weight gain of 3 pounds in one day or 5 pounds in one week  take two tablets by mouth  as needed for swelling     haloperidol 0.5 MG tablet  Commonly known as:  HALDOL  Take one tablet by mouth once daily as needed for agitation/psychosis     Melatonin 1 MG/4ML Liqd  Take 1 mL (0.25 mg total) by mouth at bedtime as needed (insomnia).     miconazole 2 % powder  Commonly known as:  LOTRIMIN AF  Apply topically as needed for itching (use beneath skin folds as needed).     MUCINEX MAXIMUM STRENGTH 1200 MG Tb12  Generic drug:  Guaifenesin  Take 1,200 mg by mouth daily. Reported on 11/06/2015     MYRBETRIQ 50 MG Tb24 tablet  Generic drug:  mirabegron ER  TAKE 1 TABLET BY MOUTH ONCE A DAY FOR BLADDER.     NAMENDA XR 28 MG Cp24 24 hr capsule  Generic drug:  memantine  TAKE 1 CAPSULE BY MOUTH EVERY DAY FOR MEMORY.     nystatin 100000 UNIT/GM Powd  2 times a day for 2 week to affected area     omega-3 acid ethyl esters 1 g capsule  Commonly known as:  LOVAZA  Take two capsules by mouth twice daily for cholesterol     pantoprazole 40 MG tablet  Commonly known as:  PROTONIX     Pramox-PE-Glycerin-Petrolatum 1-0.25-14.4-15 % Crea  Commonly known as:  PREPARATION H  Apply to hemorrhoids as needed for pain or itching     rosuvastatin 10 MG tablet  Commonly known as:  CRESTOR  TAKE (1) TABLET BY MOUTH AT BEDTIME.     vitamin C with rose hips 1000 MG tablet  Take 1,000 mg by mouth daily.     Vitamin D3 2000 units Tabs  Take 2,000 Units by mouth daily.        Health Maintenance  Topic Date Due  . ZOSTAVAX  09/16/1989  . INFLUENZA VACCINE  05/06/2016  . TETANUS/TDAP  05/13/2025  . DEXA SCAN  Completed  . PNA vac Low Risk Adult  Completed    Physical Exam: Filed  Vitals:   11/12/15 1519  BP: 122/78  Pulse: 85  Temp: 98.8 F (37.1 C)  TempSrc: Oral  Height:  (1.651 m)  Weight: 184 lb (83.462 kg)  SpO2: 96%   Body mass index is 30.62 kg/(m^2). Physical Exam  Constitutional: She appears well-nourished. No distress.  HENT:  Head: Normocephalic and atraumatic.  Cardiovascular: Normal rate, regular rhythm, normal heart sounds and intact distal pulses.   Pulmonary/Chest: Effort normal and breath sounds normal. No respiratory distress.  Abdominal: Bowel sounds are normal.  Musculoskeletal: Normal range of motion.  Seated in wheelchair; unable to stand on her own due to proximal muscle weakness 3/5; lacks postural stability in transport chair and falling asleep with head tilting back and to the right  Neurological: She exhibits abnormal muscle tone.  Lethargic today, not talking and joking like usual with her daughter  Skin: Skin is warm and dry. There is pallor.  Psychiatric: She has a normal mood and affect.    Labs reviewed: Basic Metabolic Panel:  Recent Labs  40/98/11 1519 08/22/15 0511 11/06/15 1439  NA 141 140 140  K 4.1 3.2* 4.3  CL 104 108 101  CO2 GLUCOSE 153* 97 88  BUN CREATININE 1.35* 0.84 0.84  CALCIUM 9.4 9.0 9.5   Liver Function Tests:  Recent Labs  08/20/15 1519 11/06/15 1439  AST 29 23  ALT 14 14  ALKPHOS 52  59  BILITOT 1.4* 0.4  PROT 7.2 6.9  ALBUMIN 3.7 4.0   No results for input(s): LIPASE, AMYLASE in the last 8760 hours. No results for input(s): AMMONIA in the last 8760 hours. CBC:  Recent Labs  05/14/15 1620 08/20/15 1519 11/06/15 1439  WBC 5.9 14.7* 6.6  NEUTROABS 3.2 12.6* 4.1  HGB  --  12.7  --   HCT 37.1 39.1 36.5  MCV 92 95.4 93  PLT 245 210 230   Assessment/Plan 1. Acute cystitis without hematuria -reviewed results from urine culture--pt was put on macrodantin, but not susceptible so changed to cipro today as she was not improving--still lethargic and  wanting to go home to bed - ciprofloxacin (CIPRO) 500 MG tablet; Take 1 tablet (500 mg total) by mouth 2 (two) times daily.  Dispense: 20 tablet; Refill: 0  2. Hypotension, unspecified hypotension type -chronic problem for her, on lowest dose of coreg  3. Parkinson's disease, Lewy body (HCC) -has rendered her unable to safely transfer independently--requires at least one person assist, but would do best with a sit to stand lift -also, due to these changes, she would do best staying in a wheelchair that reclines when she is up for the day--it must be high-backed as she lacks postural stability at times due to her neurological disease; this will also help with elevating her feet for her chf/edema -due to difficulty with self-repositioning and urinary incontinence, she is at risk of skin breakdown on her buttocks so requires a gel cushion on the chair -due to her PD, she is unable to perform any adls except eating w/o the use of a manual wheelchair due to above--needs this modified chair to help decrease need for transfers and protect her skin -the high back feature will provide head support during her grooming and eating -currently they only have a transport chair which she cannot sit in for prolonged time and does not have the features requested -she falls when using a walker or cane and her dementia precludes her from remembering to use them properly -her caregiver will be pushing her in the requested wheelchair 4. Chronic diastolic heart failure (HCC) -cont lasix  daily and also as needed, coreg, plavix, cannot tolerate more bp meds -needs to be able to have reclining chair with foot rests to help with edema   Labs/tests ordered:  No orders of the defined types were placed in this encounter.   Hand written Rx for manual wheelchair reclining with high back for head support, bilateral foot rests and a gel cushion  Next appt:  02/07/2016 med mgt  Danique Hartsough L. Lundon Rosier, D.O. Geriatrics [employer]  [facility] [address], [place] [address] Cell Phone (Mon-Fri 8am-5pm):  [phone] On Call:  [phone] & follow prompts after 5pm & weekends Office Phone:  [phone] Office Fax:  [phone]

## 2015-11-15 DIAGNOSIS — R260 Ataxic gait: Secondary | ICD-10-CM | POA: Diagnosis not present

## 2015-11-15 DIAGNOSIS — G20 Parkinson's disease: Secondary | ICD-10-CM | POA: Diagnosis not present

## 2015-11-15 DIAGNOSIS — I950 Idiopathic hypotension: Secondary | ICD-10-CM | POA: Diagnosis not present

## 2015-11-15 DIAGNOSIS — F0281 Dementia in other diseases classified elsewhere with behavioral disturbance: Secondary | ICD-10-CM | POA: Diagnosis not present

## 2015-11-18 ENCOUNTER — Encounter: Payer: Self-pay | Admitting: Internal Medicine

## 2015-11-19 ENCOUNTER — Telehealth: Payer: Self-pay

## 2015-11-19 NOTE — Telephone Encounter (Signed)
Fax  11/12/15 office note, with written prescription order,  for manual wheelchair, reclining with high back for head support, bilateral foot rests and gel cushion to [facility] [phone].

## 2015-11-21 ENCOUNTER — Other Ambulatory Visit: Payer: Self-pay | Admitting: *Deleted

## 2015-11-21 MED ORDER — AMBULATORY NON FORMULARY MEDICATION: Status: DC

## 2015-11-21 NOTE — Telephone Encounter (Signed)
Holly Grimes from [facility] called and stated that Rx faxed to them for Wheelchair is too dark and needs another Rx. Printed to be sign and fax to [phone]

## 2015-11-23 ENCOUNTER — Other Ambulatory Visit: Payer: Self-pay | Admitting: *Deleted

## 2015-11-23 MED ORDER — AMBULATORY NON FORMULARY MEDICATION: Status: DC

## 2015-11-23 NOTE — Telephone Encounter (Signed)
Patient daughter, [name] called and stated that she needed a Rx for a Wheelchair to [employer] with high back and with head support with elevating padded legs. Printed and faxed to [facility] in [place] [phone]  Attn Elnita Maxwell

## 2015-12-21 ENCOUNTER — Telehealth: Payer: Self-pay

## 2015-12-21 NOTE — Telephone Encounter (Signed)
Oswald.Levanshakia Speech Therapist with [facility] called to give Dr.Reed a FYI: Patient will have a missed visit this week because she is unable to make contact with the patient after several phone calls.    I called the patient's daughter and she was unaware that someone was trying to reach them about speech therapy. Patient's daughter mention [facility] speech [facility] and having physical therapy remain with [facility]. I informed her that insurance will not cover 2 home health agencies at the same time. Selena BattenKim will think about this and call if she would like to switch home health agencies.

## 2015-12-30 ENCOUNTER — Other Ambulatory Visit: Payer: Self-pay | Admitting: Internal Medicine

## 2015-12-31 ENCOUNTER — Other Ambulatory Visit: Payer: Self-pay

## 2015-12-31 MED ORDER — ESOMEPRAZOLE MAGNESIUM 40 MG PO CPDR: DELAYED_RELEASE_CAPSULE | ORAL | Status: DC

## 2016-01-03 DIAGNOSIS — F319 Bipolar disorder, unspecified: Secondary | ICD-10-CM | POA: Diagnosis not present

## 2016-01-03 DIAGNOSIS — R260 Ataxic gait: Secondary | ICD-10-CM | POA: Diagnosis not present

## 2016-01-03 DIAGNOSIS — I5032 Chronic diastolic (congestive) heart failure: Secondary | ICD-10-CM | POA: Diagnosis not present

## 2016-01-03 DIAGNOSIS — Z9181 History of falling: Secondary | ICD-10-CM | POA: Diagnosis not present

## 2016-01-03 DIAGNOSIS — I110 Hypertensive heart disease with heart failure: Secondary | ICD-10-CM | POA: Diagnosis not present

## 2016-01-03 DIAGNOSIS — F0390 Unspecified dementia without behavioral disturbance: Secondary | ICD-10-CM | POA: Diagnosis not present

## 2016-01-03 DIAGNOSIS — I2510 Atherosclerotic heart disease of native coronary artery without angina pectoris: Secondary | ICD-10-CM | POA: Diagnosis not present

## 2016-01-03 DIAGNOSIS — F0281 Dementia in other diseases classified elsewhere with behavioral disturbance: Secondary | ICD-10-CM | POA: Diagnosis not present

## 2016-01-03 DIAGNOSIS — M6281 Muscle weakness (generalized): Secondary | ICD-10-CM | POA: Diagnosis not present

## 2016-01-03 DIAGNOSIS — Z7902 Long term (current) use of antithrombotics/antiplatelets: Secondary | ICD-10-CM | POA: Diagnosis not present

## 2016-01-03 DIAGNOSIS — Z96641 Presence of right artificial hip joint: Secondary | ICD-10-CM | POA: Diagnosis not present

## 2016-01-03 DIAGNOSIS — M810 Age-related osteoporosis without current pathological fracture: Secondary | ICD-10-CM | POA: Diagnosis not present

## 2016-01-03 DIAGNOSIS — I950 Idiopathic hypotension: Secondary | ICD-10-CM | POA: Diagnosis not present

## 2016-01-03 DIAGNOSIS — Z96653 Presence of artificial knee joint, bilateral: Secondary | ICD-10-CM | POA: Diagnosis not present

## 2016-01-03 DIAGNOSIS — G20 Parkinson's disease: Secondary | ICD-10-CM | POA: Diagnosis not present

## 2016-01-11 DIAGNOSIS — G20 Parkinson's disease: Secondary | ICD-10-CM | POA: Diagnosis not present

## 2016-01-11 DIAGNOSIS — I5032 Chronic diastolic (congestive) heart failure: Secondary | ICD-10-CM | POA: Diagnosis not present

## 2016-01-11 DIAGNOSIS — I110 Hypertensive heart disease with heart failure: Secondary | ICD-10-CM | POA: Diagnosis not present

## 2016-01-11 DIAGNOSIS — I950 Idiopathic hypotension: Secondary | ICD-10-CM | POA: Diagnosis not present

## 2016-01-11 DIAGNOSIS — R260 Ataxic gait: Secondary | ICD-10-CM | POA: Diagnosis not present

## 2016-01-11 DIAGNOSIS — F0281 Dementia in other diseases classified elsewhere with behavioral disturbance: Secondary | ICD-10-CM | POA: Diagnosis not present

## 2016-01-15 DIAGNOSIS — R260 Ataxic gait: Secondary | ICD-10-CM | POA: Diagnosis not present

## 2016-01-15 DIAGNOSIS — F0281 Dementia in other diseases classified elsewhere with behavioral disturbance: Secondary | ICD-10-CM | POA: Diagnosis not present

## 2016-01-15 DIAGNOSIS — I950 Idiopathic hypotension: Secondary | ICD-10-CM | POA: Diagnosis not present

## 2016-01-15 DIAGNOSIS — I110 Hypertensive heart disease with heart failure: Secondary | ICD-10-CM | POA: Diagnosis not present

## 2016-01-15 DIAGNOSIS — I5032 Chronic diastolic (congestive) heart failure: Secondary | ICD-10-CM | POA: Diagnosis not present

## 2016-01-15 DIAGNOSIS — G20 Parkinson's disease: Secondary | ICD-10-CM | POA: Diagnosis not present

## 2016-01-17 DIAGNOSIS — F0281 Dementia in other diseases classified elsewhere with behavioral disturbance: Secondary | ICD-10-CM | POA: Diagnosis not present

## 2016-01-17 DIAGNOSIS — I5032 Chronic diastolic (congestive) heart failure: Secondary | ICD-10-CM | POA: Diagnosis not present

## 2016-01-17 DIAGNOSIS — R260 Ataxic gait: Secondary | ICD-10-CM | POA: Diagnosis not present

## 2016-01-17 DIAGNOSIS — I950 Idiopathic hypotension: Secondary | ICD-10-CM | POA: Diagnosis not present

## 2016-01-17 DIAGNOSIS — G20 Parkinson's disease: Secondary | ICD-10-CM | POA: Diagnosis not present

## 2016-01-17 DIAGNOSIS — I110 Hypertensive heart disease with heart failure: Secondary | ICD-10-CM | POA: Diagnosis not present

## 2016-01-30 ENCOUNTER — Telehealth: Payer: Self-pay | Admitting: *Deleted

## 2016-01-30 DIAGNOSIS — F0281 Dementia in other diseases classified elsewhere with behavioral disturbance: Secondary | ICD-10-CM | POA: Diagnosis not present

## 2016-01-30 DIAGNOSIS — I950 Idiopathic hypotension: Secondary | ICD-10-CM | POA: Diagnosis not present

## 2016-01-30 DIAGNOSIS — R260 Ataxic gait: Secondary | ICD-10-CM | POA: Diagnosis not present

## 2016-01-30 DIAGNOSIS — G20 Parkinson's disease: Secondary | ICD-10-CM | POA: Diagnosis not present

## 2016-01-30 DIAGNOSIS — I5032 Chronic diastolic (congestive) heart failure: Secondary | ICD-10-CM | POA: Diagnosis not present

## 2016-01-30 DIAGNOSIS — I110 Hypertensive heart disease with heart failure: Secondary | ICD-10-CM | POA: Diagnosis not present

## 2016-01-30 NOTE — Telephone Encounter (Signed)
Patient daughter, Selena BattenKim called and stated that patient has a Cough. Physical Therapist listened to patient's lungs today and stated that she had Diminished breath sound in Left Lower Lobe. Patient has CHF, does not have swelling.  Daughter cannot bring patient into office because it is so difficult because patient falls. Requesting Home Health nurse to come out and check her and obtain a mobil x-ray. Uses [facility] [phone]-Niki Gwenevere AbbotMorton. Please Advise.

## 2016-01-30 NOTE — Telephone Encounter (Signed)
I agree with home health nurse eval and mobile xray to r/o pneumonia vs chf.

## 2016-01-31 NOTE — Telephone Encounter (Signed)
Spoke with Marchelle FolksAmanda with [facility] and given verbal order for Home Health Nurse and mobile x-ray. Daughter Notified.

## 2016-02-01 DIAGNOSIS — I110 Hypertensive heart disease with heart failure: Secondary | ICD-10-CM | POA: Diagnosis not present

## 2016-02-01 DIAGNOSIS — F0281 Dementia in other diseases classified elsewhere with behavioral disturbance: Secondary | ICD-10-CM | POA: Diagnosis not present

## 2016-02-01 DIAGNOSIS — R05 Cough: Secondary | ICD-10-CM | POA: Diagnosis not present

## 2016-02-01 DIAGNOSIS — G20 Parkinson's disease: Secondary | ICD-10-CM | POA: Diagnosis not present

## 2016-02-01 DIAGNOSIS — I950 Idiopathic hypotension: Secondary | ICD-10-CM | POA: Diagnosis not present

## 2016-02-01 DIAGNOSIS — I5032 Chronic diastolic (congestive) heart failure: Secondary | ICD-10-CM | POA: Diagnosis not present

## 2016-02-01 DIAGNOSIS — R260 Ataxic gait: Secondary | ICD-10-CM | POA: Diagnosis not present

## 2016-02-07 ENCOUNTER — Telehealth: Payer: Self-pay | Admitting: *Deleted

## 2016-02-07 ENCOUNTER — Ambulatory Visit: Payer: Medicare Other | Admitting: Internal Medicine

## 2016-02-07 DIAGNOSIS — F0281 Dementia in other diseases classified elsewhere with behavioral disturbance: Secondary | ICD-10-CM | POA: Diagnosis not present

## 2016-02-07 DIAGNOSIS — I110 Hypertensive heart disease with heart failure: Secondary | ICD-10-CM | POA: Diagnosis not present

## 2016-02-07 DIAGNOSIS — I5032 Chronic diastolic (congestive) heart failure: Secondary | ICD-10-CM | POA: Diagnosis not present

## 2016-02-07 DIAGNOSIS — R260 Ataxic gait: Secondary | ICD-10-CM | POA: Diagnosis not present

## 2016-02-07 DIAGNOSIS — I950 Idiopathic hypotension: Secondary | ICD-10-CM | POA: Diagnosis not present

## 2016-02-07 DIAGNOSIS — G20 Parkinson's disease: Secondary | ICD-10-CM | POA: Diagnosis not present

## 2016-02-07 NOTE — Telephone Encounter (Signed)
Spoke with daughter and advised results, per instructions written on Xray results ( results will be scanned)  " [facility] and [facility], [facility]R unremarkable" per Dr. Renato Gailseed

## 2016-02-14 DIAGNOSIS — F0281 Dementia in other diseases classified elsewhere with behavioral disturbance: Secondary | ICD-10-CM | POA: Diagnosis not present

## 2016-02-14 DIAGNOSIS — I950 Idiopathic hypotension: Secondary | ICD-10-CM | POA: Diagnosis not present

## 2016-02-14 DIAGNOSIS — G20 Parkinson's disease: Secondary | ICD-10-CM | POA: Diagnosis not present

## 2016-02-14 DIAGNOSIS — I5032 Chronic diastolic (congestive) heart failure: Secondary | ICD-10-CM | POA: Diagnosis not present

## 2016-02-14 DIAGNOSIS — I110 Hypertensive heart disease with heart failure: Secondary | ICD-10-CM | POA: Diagnosis not present

## 2016-02-14 DIAGNOSIS — R260 Ataxic gait: Secondary | ICD-10-CM | POA: Diagnosis not present

## 2016-02-19 ENCOUNTER — Other Ambulatory Visit: Payer: Self-pay | Admitting: Internal Medicine

## 2016-02-19 ENCOUNTER — Other Ambulatory Visit: Payer: Self-pay | Admitting: *Deleted

## 2016-02-19 MED ORDER — MIRABEGRON ER 50 MG PO TB24: ORAL_TABLET | ORAL | Status: DC

## 2016-02-19 NOTE — Telephone Encounter (Signed)
Patient daughter, Selena BattenKim called and requested to be sent to pharmacy. Faxed.

## 2016-02-21 DIAGNOSIS — F0281 Dementia in other diseases classified elsewhere with behavioral disturbance: Secondary | ICD-10-CM | POA: Diagnosis not present

## 2016-02-21 DIAGNOSIS — I110 Hypertensive heart disease with heart failure: Secondary | ICD-10-CM | POA: Diagnosis not present

## 2016-02-21 DIAGNOSIS — I5032 Chronic diastolic (congestive) heart failure: Secondary | ICD-10-CM | POA: Diagnosis not present

## 2016-02-21 DIAGNOSIS — R260 Ataxic gait: Secondary | ICD-10-CM | POA: Diagnosis not present

## 2016-02-21 DIAGNOSIS — I950 Idiopathic hypotension: Secondary | ICD-10-CM | POA: Diagnosis not present

## 2016-02-21 DIAGNOSIS — G20 Parkinson's disease: Secondary | ICD-10-CM | POA: Diagnosis not present

## 2016-02-27 DIAGNOSIS — R260 Ataxic gait: Secondary | ICD-10-CM | POA: Diagnosis not present

## 2016-02-27 DIAGNOSIS — I110 Hypertensive heart disease with heart failure: Secondary | ICD-10-CM | POA: Diagnosis not present

## 2016-02-27 DIAGNOSIS — I950 Idiopathic hypotension: Secondary | ICD-10-CM | POA: Diagnosis not present

## 2016-02-27 DIAGNOSIS — F0281 Dementia in other diseases classified elsewhere with behavioral disturbance: Secondary | ICD-10-CM | POA: Diagnosis not present

## 2016-02-27 DIAGNOSIS — I5032 Chronic diastolic (congestive) heart failure: Secondary | ICD-10-CM | POA: Diagnosis not present

## 2016-02-27 DIAGNOSIS — G20 Parkinson's disease: Secondary | ICD-10-CM | POA: Diagnosis not present

## 2016-03-03 DIAGNOSIS — I950 Idiopathic hypotension: Secondary | ICD-10-CM | POA: Diagnosis not present

## 2016-03-03 DIAGNOSIS — I110 Hypertensive heart disease with heart failure: Secondary | ICD-10-CM | POA: Diagnosis not present

## 2016-03-03 DIAGNOSIS — Z96641 Presence of right artificial hip joint: Secondary | ICD-10-CM | POA: Diagnosis not present

## 2016-03-03 DIAGNOSIS — F0281 Dementia in other diseases classified elsewhere with behavioral disturbance: Secondary | ICD-10-CM | POA: Diagnosis not present

## 2016-03-03 DIAGNOSIS — M810 Age-related osteoporosis without current pathological fracture: Secondary | ICD-10-CM | POA: Diagnosis not present

## 2016-03-03 DIAGNOSIS — R260 Ataxic gait: Secondary | ICD-10-CM | POA: Diagnosis not present

## 2016-03-03 DIAGNOSIS — Z9181 History of falling: Secondary | ICD-10-CM | POA: Diagnosis not present

## 2016-03-03 DIAGNOSIS — I5032 Chronic diastolic (congestive) heart failure: Secondary | ICD-10-CM | POA: Diagnosis not present

## 2016-03-03 DIAGNOSIS — I2510 Atherosclerotic heart disease of native coronary artery without angina pectoris: Secondary | ICD-10-CM | POA: Diagnosis not present

## 2016-03-03 DIAGNOSIS — F319 Bipolar disorder, unspecified: Secondary | ICD-10-CM | POA: Diagnosis not present

## 2016-03-03 DIAGNOSIS — G20 Parkinson's disease: Secondary | ICD-10-CM | POA: Diagnosis not present

## 2016-03-03 DIAGNOSIS — Z96653 Presence of artificial knee joint, bilateral: Secondary | ICD-10-CM | POA: Diagnosis not present

## 2016-03-03 DIAGNOSIS — M6281 Muscle weakness (generalized): Secondary | ICD-10-CM | POA: Diagnosis not present

## 2016-03-03 DIAGNOSIS — Z7902 Long term (current) use of antithrombotics/antiplatelets: Secondary | ICD-10-CM | POA: Diagnosis not present

## 2016-03-06 DIAGNOSIS — G20 Parkinson's disease: Secondary | ICD-10-CM | POA: Diagnosis not present

## 2016-03-06 DIAGNOSIS — I110 Hypertensive heart disease with heart failure: Secondary | ICD-10-CM | POA: Diagnosis not present

## 2016-03-06 DIAGNOSIS — I5032 Chronic diastolic (congestive) heart failure: Secondary | ICD-10-CM | POA: Diagnosis not present

## 2016-03-06 DIAGNOSIS — F0281 Dementia in other diseases classified elsewhere with behavioral disturbance: Secondary | ICD-10-CM | POA: Diagnosis not present

## 2016-03-06 DIAGNOSIS — M6281 Muscle weakness (generalized): Secondary | ICD-10-CM | POA: Diagnosis not present

## 2016-03-06 DIAGNOSIS — R260 Ataxic gait: Secondary | ICD-10-CM | POA: Diagnosis not present

## 2016-03-10 ENCOUNTER — Ambulatory Visit: Payer: Medicare Other | Admitting: Internal Medicine

## 2016-03-12 DIAGNOSIS — F0281 Dementia in other diseases classified elsewhere with behavioral disturbance: Secondary | ICD-10-CM | POA: Diagnosis not present

## 2016-03-12 DIAGNOSIS — I110 Hypertensive heart disease with heart failure: Secondary | ICD-10-CM | POA: Diagnosis not present

## 2016-03-12 DIAGNOSIS — R260 Ataxic gait: Secondary | ICD-10-CM | POA: Diagnosis not present

## 2016-03-12 DIAGNOSIS — G20 Parkinson's disease: Secondary | ICD-10-CM | POA: Diagnosis not present

## 2016-03-12 DIAGNOSIS — I5032 Chronic diastolic (congestive) heart failure: Secondary | ICD-10-CM | POA: Diagnosis not present

## 2016-03-12 DIAGNOSIS — M6281 Muscle weakness (generalized): Secondary | ICD-10-CM | POA: Diagnosis not present

## 2016-03-17 ENCOUNTER — Ambulatory Visit (INDEPENDENT_AMBULATORY_CARE_PROVIDER_SITE_OTHER): Payer: Medicare Other | Admitting: Internal Medicine

## 2016-03-17 ENCOUNTER — Encounter: Payer: Self-pay | Admitting: Internal Medicine

## 2016-03-17 VITALS — BP 122/68 | HR 86 | Temp 98.5°F | Ht 65.0 in | Wt 181.0 lb

## 2016-03-17 DIAGNOSIS — G3183 Dementia with Lewy bodies: Secondary | ICD-10-CM | POA: Diagnosis not present

## 2016-03-17 DIAGNOSIS — I10 Essential (primary) hypertension: Secondary | ICD-10-CM | POA: Diagnosis not present

## 2016-03-17 DIAGNOSIS — I5032 Chronic diastolic (congestive) heart failure: Secondary | ICD-10-CM | POA: Diagnosis not present

## 2016-03-17 DIAGNOSIS — K219 Gastro-esophageal reflux disease without esophagitis: Secondary | ICD-10-CM | POA: Diagnosis not present

## 2016-03-17 DIAGNOSIS — F0280 Dementia in other diseases classified elsewhere without behavioral disturbance: Secondary | ICD-10-CM

## 2016-03-17 DIAGNOSIS — R231 Pallor: Secondary | ICD-10-CM | POA: Diagnosis not present

## 2016-03-17 DIAGNOSIS — R5382 Chronic fatigue, unspecified: Secondary | ICD-10-CM

## 2016-03-17 MED ORDER — CARVEDILOL 3.125 MG PO TABS: 3.1250 mg | ORAL_TABLET | Freq: Every day | ORAL | Status: DC

## 2016-03-17 MED ORDER — ESOMEPRAZOLE MAGNESIUM 40 MG PO CPDR: 40.0000 mg | DELAYED_RELEASE_CAPSULE | Freq: Every day | ORAL | Status: DC

## 2016-03-17 NOTE — Progress Notes (Signed)
Location:  [facility] Provider:  Ethin Drummond L. Renato Gails, D.O., C.M.D.  Code Status: DNR Goals of Care:  Advanced Directives 03/17/2016  Does patient have an advance directive? Yes  Type of Estate agent of [place];Out of facility DNR (pink MOST or yellow form)  Copy of advanced directive(s) in chart? Yes  Pre-existing out of facility DNR order (yellow form or pink MOST form) Yellow form placed in chart (order not valid for inpatient use)     Chief Complaint  Patient presents with  . Medical Management of Chronic Issues    3 mth follow-up    HPI: Patient is a 82 y.o. female seen today for medical management of chronic diseases.    Pt is having a "bad day".  One of the caregivers was documenting made up bp readings.  BP running low more than average or high.  Cannot stay awake if she's had coreg.  Has held coreg a lot.    She's having more mania and crashing spells.  She has not had the best regular schedule--new effort ongoing with that now.    There were problems with her not getting changed at night and her skin being wet.  Is getting rolled and changed more regularly.  Cannot reposition herself well.  Needs head elevated.  Asks about alternating pressure mattress.  She does have a hospital bed.  Has quarter bed rails at hips to prevent falls from bed also.  Is at [facility].  Loves it there-has made friends and has great view.    Weight said to be up a lot this am, but had been into 170s at one point.  Today 181 lbs.  Was up 3 lbs after not eating for a few days, but then up again today so will need lasix.    Has had so many spells of hypotension that PT has not been able to work for her.    Past Medical History  Diagnosis Date  . Hypertension   . High cholesterol   . Mitral valve prolapse   . Dementia   . Parkinson's disease, Lewy body (HCC)   . Unspecified constipation   . Onychia and paronychia of toe   . Spasm of muscle   . Abnormality of gait     . Orthostatic hypotension   . Major depressive disorder, single episode, unspecified (HCC)   . Diaphragmatic hernia without mention of obstruction or gangrene   . Alzheimer's disease   . Osteoarthrosis, unspecified whether generalized or localized, unspecified site   . Osteoporosis, unspecified   . Unspecified constipation   . Sprain of ribs   . Delirium due to conditions classified elsewhere   . Diarrhea   . Edema   . Pain in joint, pelvic region and thigh   . Other malaise and fatigue   . Urinary frequency [id number]  . Personal history of fall   . Female stress incontinence   . Other and unspecified hyperlipidemia   . Obesity, unspecified   . Osteoarthrosis, unspecified whether generalized or localized, unspecified site   . Pain in joint, shoulder region   . Transient ischemic attack (TIA), and cerebral infarction without residual deficits   . Risk for falls     Past Surgical History  Procedure Laterality Date  . Joint replacement    . Tonsillectomy    . Ankle surgery Left 1998  . Total knee arthroplasty Right 2002  . Total knee arthroplasty Left 2005  . Hip arthroplasty Right 09/24/2013  Procedure: ARTHROPLASTY BIPOLAR HIP;  Surgeon: Loanne Drilling, MD;  Location: [facility];  Service: Orthopedics;  Laterality: Right;    Allergies  Allergen Reactions  . Cephalexin Nausea And Vomiting  . Sulfa Antibiotics Other (See Comments)    Daughter unsure of reaction, but believes it is severe  . Sulfites     unknown      Medication List       This list is accurate as of: 03/17/16 10:39 AM.  Always use your most recent med list.               acetaminophen 650 MG CR tablet  Commonly known as:  TYLENOL  Take 1,300 mg by mouth 2 (two) times daily.     albuterol 1.25 MG/3ML nebulizer solution  Commonly known as:  ACCUNEB  Take 3 mLs (1.25 mg total) by nebulization every 6 (six) hours as needed for wheezing.     ALIGN 4 MG Caps  Take 1 capsule by mouth daily.      BENEFIBER Powd  Take by mouth 2 (two) times daily.     CALCIUM CITRATE-VITAMIN D PO  Take by mouth 2 (two) times daily.     carvedilol 3.125 MG tablet  Commonly known as:  COREG  TAKE 1 TABLET BY MOUTH TWICE DAILY WITH MEALS.     CENTRUM SILVER ULTRA WOMENS PO  Take 1 tablet by mouth every morning.     citalopram 20 MG tablet  Commonly known as:  CELEXA  TAKE 1 TABLET BY MOUTH ONCE A DAY.     clopidogrel 75 MG tablet  Commonly known as:  PLAVIX  TAKE 1 TABLET BY MOUTH ONCE A DAY TO HELP CIRCULATION.     COLACE 100 MG capsule  Generic drug:  docusate sodium  Take 100 mg by mouth at bedtime.     esomeprazole 40 MG capsule  Commonly known as:  NEXIUM  Take 40 mg by mouth daily at 12 noon.     EXELON 4.6 mg/24hr  Generic drug:  rivastigmine  APPLY 1 PATCH TO SKIN DAILY.REMOVE USED PATCH.     feeding supplement Liqd  Take 1 Container by mouth as needed.     fluticasone 0.05 % cream  Commonly known as:  CUTIVATE  Apply 1 application topically 2 (two) times daily. To affected areas as needed for skin irritaion     furosemide 20 MG tablet  Commonly known as:  LASIX  TAKE 1 TAB BY MOUTH ONCE DAILY UNLESS WEIGHT GAIN OF 3 LBS IN 1 DAY OR 5 LBS IN 1 WEEK,TAKE 2 TABS BY MOUTH AS NEEDED FOR SWELLING.     haloperidol 0.5 MG tablet  Commonly known as:  HALDOL  Take one tablet by mouth once daily as needed for agitation/psychosis     Melatonin 1 MG/4ML Liqd  Take 1 mL (0.25 mg total) by mouth at bedtime as needed (insomnia).     miconazole 2 % powder  Commonly known as:  LOTRIMIN AF  Apply topically as needed for itching (use beneath skin folds as needed).     mirabegron ER 50 MG Tb24 tablet  Commonly known as:  MYRBETRIQ  Take one tablet by mouth once daily for bladder     MUCINEX MAXIMUM STRENGTH 1200 MG Tb12  Generic drug:  Guaifenesin  Take 1,200 mg by mouth daily. Reported on 11/06/2015     NAMENDA XR 28 MG Cp24 24 hr capsule  Generic drug:  memantine  TAKE 1  CAPSULE  BY MOUTH EVERY DAY FOR MEMORY.     nystatin powder  Generic drug:  nystatin  2 times a day for 2 week to affected area     omega-3 acid ethyl esters 1 g capsule  Commonly known as:  LOVAZA  Take two capsules by mouth twice daily for cholesterol     Pramox-PE-Glycerin-Petrolatum 1-0.25-14.4-15 % Crea  Commonly known as:  PREPARATION H  Apply to hemorrhoids as needed for pain or itching     rosuvastatin 10 MG tablet  Commonly known as:  CRESTOR  TAKE (1) TABLET BY MOUTH AT BEDTIME.     vitamin C with rose hips 1000 MG tablet  Take 1,000 mg by mouth daily.     Vitamin D3 2000 units Tabs  Take 2,000 Units by mouth daily.        Review of Systems:  Review of Systems  Constitutional: Positive for malaise/fatigue. Negative for fever and chills.  HENT: Positive for hearing loss. Negative for congestion.   Eyes: Negative for blurred vision.  Respiratory: Positive for cough. Negative for shortness of breath.   Cardiovascular: Positive for leg swelling. Negative for chest pain and palpitations.  Gastrointestinal: Negative for abdominal pain, constipation, blood in stool and melena.  Genitourinary: Negative for dysuria.  Musculoskeletal: Negative for myalgias and falls.  Skin: Negative for rash.  Neurological: Positive for dizziness and weakness.  Psychiatric/Behavioral: Positive for memory loss.    Health Maintenance  Topic Date Due  . ZOSTAVAX  09/16/1989  . INFLUENZA VACCINE  05/06/2016  . TETANUS/TDAP  05/13/2025  . DEXA SCAN  Completed  . PNA vac Low Risk Adult  Completed    Physical Exam: Filed Vitals:   03/17/16 1017  BP: 122/68  Pulse: 86  Temp: 98.5 F (36.9 C)  TempSrc: Oral  Height: 5\' 5"  (1.651 m)  Weight: 181 lb (82.101 kg)  SpO2: 100%   Body mass index is 30.12 kg/(m^2). Physical Exam  Constitutional: She appears well-developed and well-nourished. No distress.  HENT:  Head: Normocephalic and atraumatic.  Cardiovascular: Normal rate,  regular rhythm, normal heart sounds and intact distal pulses.   Edema of bilateral LEs even with compression hose today  Pulmonary/Chest: Effort normal and breath sounds normal. No respiratory distress.  Abdominal: Soft. Bowel sounds are normal. She exhibits no distension. There is no tenderness.  Musculoskeletal: Normal range of motion.  Comes in in wheelchair  Neurological: She is alert.  Skin: Skin is warm and dry. There is pallor.  Psychiatric:  Very pleasant, denies everything    Labs reviewed: Basic Metabolic Panel:  Recent Labs  16/10/96 1519 08/22/15 0511 11/06/15 1439  NA 141 140 140  K 4.1 3.2* 4.3  CL 104 108 101  CO2 26 28 24   GLUCOSE 153* 97 88  BUN 20 15 13   CREATININE 1.35* 0.84 0.84  CALCIUM 9.4 9.0 9.5   Liver Function Tests:  Recent Labs  08/20/15 1519 11/06/15 1439  AST 29 23  ALT 14 14  ALKPHOS 52 59  BILITOT 1.4* 0.4  PROT 7.2 6.9  ALBUMIN 3.7 4.0   No results for input(s): LIPASE, AMYLASE in the last 8760 hours. No results for input(s): AMMONIA in the last 8760 hours. CBC:  Recent Labs  05/14/15 1620 08/20/15 1519 11/06/15 1439  WBC 5.9 14.7* 6.6  NEUTROABS 3.2 12.6* 4.1  HGB  --  12.7  --   HCT 37.1 39.1 36.5  MCV 92 95.4 93  PLT 245 210 230   Assessment/Plan 1.  Chronic fatigue - seems related to hypotension -will reduce coreg to only at bedtime (not typical dose, but has morning hypotension if bid) - CBC with Differential/Platelet - Comprehensive metabolic panel  2. Essential hypertension, benign - bps running low in am so change coreg to just hs - carvedilol (COREG) 3.125 MG tablet; Take 1 tablet (3.125 mg total) by mouth at bedtime.  Dispense: 30 tablet; Refill: 3 - Comprehensive metabolic panel  3. Gastroesophageal reflux disease, esophagitis presence not specified - her daughter notes that cough and indigestion not helped by the generic so she wants to try her on brand nexium and see if it helps more - esomeprazole  (NEXIUM) 40 MG capsule; Take 1 capsule (40 mg total) by mouth daily at 12 noon.  Dispense: 30 capsule; Refill: 3  4. Pale complexion - noted to be paler than usual today so will check blood counts - CBC with Differential/Platelet  5. Chronic diastolic heart failure (HCC) - is to take lasix today (or tomorrow if too late to take today and not be up all night) due to weight gain recently with more edema - Comprehensive metabolic panel  6. Parkinson's disease, Lewy body (HCC) -ideally would do well in a memory care unit -her daughter is looking at local facilities  Labs/tests ordered:   Orders Placed This Encounter  Procedures  . CBC with Differential/Platelet  . Comprehensive metabolic panel   Next appt:  3 mos for med mgt  Cristina Ceniceros L. Daffney Greenly, D.O. Geriatrics [facility] [address] [place], [place] [address] Cell Phone (Mon-Fri 8am-5pm):  [phone] On Call:  [phone] & follow prompts after 5pm & weekends Office Phone:  [phone] Office Fax:  [phone]

## 2016-03-17 NOTE — Patient Instructions (Signed)
Change coreg to night time only.

## 2016-03-18 LAB — COMPREHENSIVE METABOLIC PANEL
ALT: 13 IU/L (ref 0–32)
AST: 24 IU/L (ref 0–40)
Albumin/Globulin Ratio: 1.4 (ref 1.2–2.2)
Albumin: 3.9 g/dL (ref 3.5–4.7)
Alkaline Phosphatase: 59 IU/L (ref 39–117)
BUN/Creatinine Ratio: 21 (ref 12–28)
BUN: 16 mg/dL (ref 8–27)
Bilirubin Total: 0.4 mg/dL (ref 0.0–1.2)
CO2: 26 mmol/L (ref 18–29)
Calcium: 9.4 mg/dL (ref 8.7–10.3)
Chloride: 100 mmol/L (ref 96–106)
Creatinine, Ser: 0.76 mg/dL (ref 0.57–1.00)
GFR calc Af Amer: 82 mL/min/{1.73_m2} (ref >59)
GFR calc non Af Amer: 71 mL/min/{1.73_m2} (ref >59)
Globulin, Total: 2.8 g/dL (ref 1.5–4.5)
Glucose: 100 mg/dL — ABNORMAL HIGH (ref 65–99)
Potassium: 4.2 mmol/L (ref 3.5–5.2)
Sodium: 140 mmol/L (ref 134–144)
Total Protein: 6.7 g/dL (ref 6.0–8.5)

## 2016-03-18 LAB — CBC WITH DIFFERENTIAL/PLATELET
Basophils Absolute: 0 10*3/uL (ref 0.0–0.2)
Basos: 1 %
EOS (ABSOLUTE): 0.2 10*3/uL (ref 0.0–0.4)
Eos: 3 %
Hematocrit: 37 % (ref 34.0–46.6)
Hemoglobin: 12.5 g/dL (ref 11.1–15.9)
Immature Grans (Abs): 0 10*3/uL (ref 0.0–0.1)
Immature Granulocytes: 0 %
Lymphocytes Absolute: 1.6 10*3/uL (ref 0.7–3.1)
Lymphs: 29 %
MCH: 31.6 pg (ref 26.6–33.0)
MCHC: 33.8 g/dL (ref 31.5–35.7)
MCV: 93 fL (ref 79–97)
Monocytes Absolute: 0.6 10*3/uL (ref 0.1–0.9)
Monocytes: 10 %
Neutrophils Absolute: 3.2 10*3/uL (ref 1.4–7.0)
Neutrophils: 57 %
Platelets: 230 10*3/uL (ref 150–379)
RBC: 3.96 x10E6/uL (ref 3.77–5.28)
RDW: 12.7 % (ref 12.3–15.4)
WBC: 5.6 10*3/uL (ref 3.4–10.8)

## 2016-03-20 ENCOUNTER — Encounter: Payer: Self-pay | Admitting: *Deleted

## 2016-04-02 DIAGNOSIS — I5032 Chronic diastolic (congestive) heart failure: Secondary | ICD-10-CM | POA: Diagnosis not present

## 2016-04-02 DIAGNOSIS — R260 Ataxic gait: Secondary | ICD-10-CM | POA: Diagnosis not present

## 2016-04-02 DIAGNOSIS — G20 Parkinson's disease: Secondary | ICD-10-CM | POA: Diagnosis not present

## 2016-04-02 DIAGNOSIS — F0281 Dementia in other diseases classified elsewhere with behavioral disturbance: Secondary | ICD-10-CM | POA: Diagnosis not present

## 2016-04-02 DIAGNOSIS — I110 Hypertensive heart disease with heart failure: Secondary | ICD-10-CM | POA: Diagnosis not present

## 2016-04-02 DIAGNOSIS — M6281 Muscle weakness (generalized): Secondary | ICD-10-CM | POA: Diagnosis not present

## 2016-04-08 ENCOUNTER — Inpatient Hospital Stay (HOSPITAL_COMMUNITY)
Admission: EM | Admit: 2016-04-08 | Discharge: 2016-04-10 | DRG: 690 | Disposition: A | Discharge status: Home Health | Payer: Medicare Other | Attending: Family Medicine | Admitting: Family Medicine

## 2016-04-08 ENCOUNTER — Encounter (HOSPITAL_COMMUNITY): Payer: Self-pay

## 2016-04-08 ENCOUNTER — Emergency Department (HOSPITAL_COMMUNITY): Payer: Medicare Other

## 2016-04-08 DIAGNOSIS — K7689 Other specified diseases of liver: Secondary | ICD-10-CM | POA: Diagnosis present

## 2016-04-08 DIAGNOSIS — R627 Adult failure to thrive: Secondary | ICD-10-CM | POA: Diagnosis present

## 2016-04-08 DIAGNOSIS — G309 Alzheimer's disease, unspecified: Secondary | ICD-10-CM | POA: Diagnosis not present

## 2016-04-08 DIAGNOSIS — R4182 Altered mental status, unspecified: Secondary | ICD-10-CM | POA: Diagnosis not present

## 2016-04-08 DIAGNOSIS — N3000 Acute cystitis without hematuria: Principal | ICD-10-CM | POA: Diagnosis present

## 2016-04-08 DIAGNOSIS — E785 Hyperlipidemia, unspecified: Secondary | ICD-10-CM | POA: Diagnosis present

## 2016-04-08 DIAGNOSIS — R531 Weakness: Secondary | ICD-10-CM | POA: Diagnosis not present

## 2016-04-08 DIAGNOSIS — L271 Localized skin eruption due to drugs and medicaments taken internally: Secondary | ICD-10-CM | POA: Diagnosis present

## 2016-04-08 DIAGNOSIS — G20 Parkinson's disease: Secondary | ICD-10-CM | POA: Diagnosis present

## 2016-04-08 DIAGNOSIS — R402411 Glasgow coma scale score 13-15, in the field [EMT or ambulance]: Secondary | ICD-10-CM | POA: Diagnosis not present

## 2016-04-08 DIAGNOSIS — R05 Cough: Secondary | ICD-10-CM | POA: Diagnosis not present

## 2016-04-08 DIAGNOSIS — Z7902 Long term (current) use of antithrombotics/antiplatelets: Secondary | ICD-10-CM

## 2016-04-08 DIAGNOSIS — Z8673 Personal history of transient ischemic attack (TIA), and cerebral infarction without residual deficits: Secondary | ICD-10-CM

## 2016-04-08 DIAGNOSIS — E7800 Pure hypercholesterolemia, unspecified: Secondary | ICD-10-CM | POA: Diagnosis present

## 2016-04-08 DIAGNOSIS — R1011 Right upper quadrant pain: Secondary | ICD-10-CM | POA: Diagnosis present

## 2016-04-08 DIAGNOSIS — I5032 Chronic diastolic (congestive) heart failure: Secondary | ICD-10-CM | POA: Diagnosis present

## 2016-04-08 DIAGNOSIS — G3183 Dementia with Lewy bodies: Secondary | ICD-10-CM | POA: Diagnosis present

## 2016-04-08 DIAGNOSIS — I110 Hypertensive heart disease with heart failure: Secondary | ICD-10-CM | POA: Diagnosis present

## 2016-04-08 DIAGNOSIS — Z882 Allergy status to sulfonamides status: Secondary | ICD-10-CM

## 2016-04-08 DIAGNOSIS — Z66 Do not resuscitate: Secondary | ICD-10-CM | POA: Diagnosis present

## 2016-04-08 DIAGNOSIS — Z79899 Other long term (current) drug therapy: Secondary | ICD-10-CM

## 2016-04-08 DIAGNOSIS — Z96641 Presence of right artificial hip joint: Secondary | ICD-10-CM | POA: Diagnosis present

## 2016-04-08 DIAGNOSIS — R0902 Hypoxemia: Secondary | ICD-10-CM | POA: Diagnosis present

## 2016-04-08 DIAGNOSIS — F0280 Dementia in other diseases classified elsewhere without behavioral disturbance: Secondary | ICD-10-CM | POA: Diagnosis present

## 2016-04-08 DIAGNOSIS — Z881 Allergy status to other antibiotic agents status: Secondary | ICD-10-CM

## 2016-04-08 DIAGNOSIS — R109 Unspecified abdominal pain: Secondary | ICD-10-CM | POA: Diagnosis present

## 2016-04-08 DIAGNOSIS — M810 Age-related osteoporosis without current pathological fracture: Secondary | ICD-10-CM | POA: Diagnosis present

## 2016-04-08 DIAGNOSIS — I341 Nonrheumatic mitral (valve) prolapse: Secondary | ICD-10-CM | POA: Diagnosis present

## 2016-04-08 DIAGNOSIS — N390 Urinary tract infection, site not specified: Secondary | ICD-10-CM | POA: Diagnosis present

## 2016-04-08 DIAGNOSIS — T368X5A Adverse effect of other systemic antibiotics, initial encounter: Secondary | ICD-10-CM | POA: Diagnosis present

## 2016-04-08 DIAGNOSIS — Z8249 Family history of ischemic heart disease and other diseases of the circulatory system: Secondary | ICD-10-CM

## 2016-04-08 DIAGNOSIS — Z96653 Presence of artificial knee joint, bilateral: Secondary | ICD-10-CM | POA: Diagnosis present

## 2016-04-08 LAB — I-STAT CHEM 8, ED
BUN: 14 mg/dL (ref 6–20)
CALCIUM ION: 1.07 mmol/L — AB (ref 1.12–1.23)
CREATININE: 0.8 mg/dL (ref 0.44–1.00)
Chloride: 104 mmol/L (ref 101–111)
GLUCOSE: 86 mg/dL (ref 65–99)
HCT: 35 % — ABNORMAL LOW (ref 36.0–46.0)
Hemoglobin: 11.9 g/dL — ABNORMAL LOW (ref 12.0–15.0)
Potassium: 4 mmol/L (ref 3.5–5.1)
Sodium: 141 mmol/L (ref 135–145)
TCO2: 29 mmol/L (ref 0–100)

## 2016-04-08 LAB — CBC WITH DIFFERENTIAL/PLATELET
Basophils Absolute: 0 10*3/uL (ref 0.0–0.1)
Basophils Relative: 0 %
Eosinophils Absolute: 0.2 10*3/uL (ref 0.0–0.7)
Eosinophils Relative: 3 %
HEMATOCRIT: 35.2 % — AB (ref 36.0–46.0)
HEMOGLOBIN: 12 g/dL (ref 12.0–15.0)
LYMPHS ABS: 1.8 10*3/uL (ref 0.7–4.0)
Lymphocytes Relative: 33 %
MCH: 32.2 pg (ref 26.0–34.0)
MCHC: 34.1 g/dL (ref 30.0–36.0)
MCV: 94.4 fL (ref 78.0–100.0)
MONO ABS: 0.6 10*3/uL (ref 0.1–1.0)
MONOS PCT: 11 %
NEUTROS ABS: 2.8 10*3/uL (ref 1.7–7.7)
NEUTROS PCT: 53 %
Platelets: 195 10*3/uL (ref 150–400)
RBC: 3.73 MIL/uL — ABNORMAL LOW (ref 3.87–5.11)
RDW: 12.7 % (ref 11.5–15.5)
WBC: 5.4 10*3/uL (ref 4.0–10.5)

## 2016-04-08 LAB — I-STAT TROPONIN, ED: Troponin i, poc: 0 ng/mL (ref 0.00–0.08)

## 2016-04-08 LAB — CBG MONITORING, ED: GLUCOSE-CAPILLARY: 88 mg/dL (ref 65–99)

## 2016-04-08 LAB — PROTIME-INR
INR: 1.05 (ref 0.00–1.49)
Prothrombin Time: 13.9 seconds (ref 11.6–15.2)

## 2016-04-08 MED ORDER — VANCOMYCIN HCL 10 G IV SOLR
1500.0000 mg | Freq: Once | INTRAVENOUS | Status: AC
  Administered 2016-04-09: 1500 mg via INTRAVENOUS
  Filled 2016-04-08: qty 1500

## 2016-04-08 MED ORDER — SODIUM CHLORIDE 0.9 % IV BOLUS (SEPSIS): 1000.0000 mL | Freq: Once | INTRAVENOUS | Status: DC

## 2016-04-08 MED ORDER — DEXTROSE 5 % IV SOLN
2.0000 g | Freq: Once | INTRAVENOUS | Status: AC
  Administered 2016-04-09: 2 g via INTRAVENOUS
  Filled 2016-04-08: qty 2

## 2016-04-08 NOTE — ED Notes (Signed)
Bed: [id number] Expected date:  Expected time:  Means of arrival:  Comments: EMS 82 yo female failure to thrive/hx dementia-[place]

## 2016-04-08 NOTE — ED Notes (Addendum)
Per EMS- [place]- Has pain all over, generalized weakness-hasn't been out of bed all day, less verbal than usual. Normal level of orientation. Hx of dementia.

## 2016-04-08 NOTE — ED Provider Notes (Signed)
CSN: [id number]     Arrival date & time 04/08/16  2214 History  By signing my name below, I, Majel HomerPeyton Lee, attest that this documentation has been prepared under the direction and in the presence of Tomasita CrumbleAdeleke Alayah Knouff, MD . Electronically Signed: Majel HomerPeyton Lee, Scribe. 04/08/2016. 11:32 PM.   Chief Complaint  Patient presents with  . Failure To Thrive   Level V Caveat due to AMS  The history is provided by a relative. No language interpreter was used.   HPI Comments: Holly Grimes is a 82 y.o. female with PMHx of HTN, HLD, and dementia, who presents to the Emergency Department by daughter with a complaint of gradually worsening, chest and abdominal pain that began on 04/03/16. When asked where pt feels pain, pt pointed to her chest. Pt's daughter states she believes her pain may be stemming from under her ribs. Her daughter states pt recently had 2 large BM's in one day. Pt's daughter also reports pt has had decreased fluid intake (~30 oz). Pt's daughter reports pt's urine has been darker than usual; she is wondering about a potential UTI. Pt's daughter also notes associated productive cough that was greenish in color that began yesterday with labored breathing. She reports pt's pain is exacerbated when her head is elevated. Per daughter, pt has not had a recent fall. Pt's daughter reports pt is currently taking Tyelnol 8 hour; she notes her last dosage of Tylenol was at 2:30pm. Per daughter, pt does not take aspirin.Per daughter, pt is not on any oxygen treatments at home. Pt's daughter also notes [facility].   Past Medical History  Diagnosis Date  . Hypertension   . High cholesterol   . Mitral valve prolapse   . Dementia   . Parkinson's disease, Lewy body (HCC)   . Unspecified constipation   . Onychia and paronychia of toe   . Spasm of muscle   . Abnormality of gait   . Orthostatic hypotension   . Major depressive disorder, single episode, unspecified (HCC)   .  Diaphragmatic hernia without mention of obstruction or gangrene   . Alzheimer's disease   . Osteoarthrosis, unspecified whether generalized or localized, unspecified site   . Osteoporosis, unspecified   . Unspecified constipation   . Sprain of ribs   . Delirium due to conditions classified elsewhere   . Diarrhea   . Edema   . Pain in joint, pelvic region and thigh   . Other malaise and fatigue   . Urinary frequency [id number]  . Personal history of fall   . Female stress incontinence   . Other and unspecified hyperlipidemia   . Obesity, unspecified   . Osteoarthrosis, unspecified whether generalized or localized, unspecified site   . Pain in joint, shoulder region   . Transient ischemic attack (TIA), and cerebral infarction without residual deficits   . Risk for falls    Past Surgical History  Procedure Laterality Date  . Joint replacement    . Tonsillectomy    . Ankle surgery Left 1998  . Total knee arthroplasty Right 2002  . Total knee arthroplasty Left 2005  . Hip arthroplasty Right 09/24/2013    Procedure: ARTHROPLASTY BIPOLAR HIP;  Surgeon: Loanne DrillingFrank V Aluisio, MD;  Location: [facility];  Service: Orthopedics;  Laterality: Right;   Family History  Problem Relation Age of Onset  . Adopted: Yes  . Hypertension Other    Social History  Substance Use Topics  . Smoking status: Never  Smoker   . Smokeless tobacco: Never Used  . Alcohol Use: No   OB History    No data available     Review of Systems  Unable to perform ROS: Dementia   Allergies  Cephalexin; Sulfa antibiotics; and Sulfites  Home Medications   Prior to Admission medications   Medication Sig Start Date End Date Taking? Authorizing Provider  acetaminophen (TYLENOL) 650 MG CR tablet Take 1,300 mg by mouth 2 (two) times daily.    Yes Historical Provider, MD  albuterol (ACCUNEB) 1.25 MG/3ML nebulizer solution Take 3 mLs (1.25 mg total) by nebulization every 6 (six) hours as needed for wheezing. 01/08/15  Yes Tiffany  L Reed, DO  Ascorbic Acid (VITAMIN C WITH ROSE HIPS) 1000 MG tablet Take 1,000 mg by mouth daily.     Yes Historical Provider, MD  CALCIUM CITRATE-VITAMIN D PO Take by mouth 2 (two) times daily.    Yes Historical Provider, MD  carvedilol (COREG) 3.125 MG tablet Take 1 tablet (3.125 mg total) by mouth at bedtime. 03/17/16  Yes Tiffany L Reed, DO  Cholecalciferol (VITAMIN D3) 2000 UNITS TABS Take 2,000 Units by mouth daily. 01/08/15  Yes Tiffany L Reed, DO  citalopram (CELEXA) 20 MG tablet TAKE 1 TABLET BY MOUTH ONCE A DAY. 12/31/15  Yes Tiffany L Reed, DO  clopidogrel (PLAVIX) 75 MG tablet TAKE 1 TABLET BY MOUTH ONCE A DAY TO HELP CIRCULATION. 12/31/15  Yes Tiffany L Reed, DO  docusate sodium (COLACE) 100 MG capsule Take 100 mg by mouth at bedtime.   Yes Historical Provider, MD  esomeprazole (NEXIUM) 40 MG capsule Take 1 capsule (40 mg total) by mouth daily at 12 noon. 03/17/16  Yes Tiffany L Reed, DO  EXELON 4.6 MG/24HR APPLY 1 PATCH TO SKIN DAILY.REMOVE USED PATCH. 12/31/15  Yes Tiffany L Reed, DO  feeding supplement (BOOST HIGH PROTEIN) LIQD Take 1 Container by mouth as needed.    Yes Historical Provider, MD  fluticasone (CUTIVATE) 0.05 % cream Apply 1 application topically 2 (two) times daily. To affected areas as needed for skin irritaion 01/08/15  Yes Tiffany L Reed, DO  furosemide (LASIX) 20 MG tablet TAKE 1 TAB BY MOUTH ONCE DAILY UNLESS WEIGHT GAIN OF 3 LBS IN 1 DAY OR 5 LBS IN 1 WEEK,TAKE 2 TABS BY MOUTH AS NEEDED FOR SWELLING. 12/31/15  Yes Tiffany L Reed, DO  Guaifenesin (MUCINEX MAXIMUM STRENGTH) 1200 MG TB12 Take 1,200 mg by mouth daily. Reported on 11/06/2015   Yes Historical Provider, MD  haloperidol (HALDOL) 0.5 MG tablet Take one tablet by mouth once daily as needed for agitation/psychosis 01/08/15  Yes Tiffany L Reed, DO  Melatonin 1 MG/4ML LIQD Take 1 mL (0.25 mg total) by mouth at bedtime as needed (insomnia). 02/01/15  Yes Tiffany L Reed, DO  miconazole (LOTRIMIN AF) 2 % powder Apply topically  as needed for itching (use beneath skin folds as needed). 02/01/15  Yes Tiffany L Reed, DO  mirabegron ER (MYRBETRIQ) 50 MG TB24 tablet Take one tablet by mouth once daily for bladder 02/19/16  Yes Tiffany L Reed, DO  Multiple Vitamins-Minerals (CENTRUM SILVER ULTRA WOMENS PO) Take 1 tablet by mouth every morning.     Yes Historical Provider, MD  NAMENDA XR 28 MG CP24 24 hr capsule TAKE 1 CAPSULE BY MOUTH EVERY DAY FOR MEMORY. 12/31/15  Yes Tiffany L Reed, DO  nystatin (MYCOSTATIN/NYSTOP) 100000 UNIT/GM POWD 2 times a day for 2 week to affected area Patient taking differently: Apply 1  g topically 2 (two) times daily as needed (for irration).  06/25/15  Yes Tiffany L Reed, DO  omega-3 acid ethyl esters (LOVAZA) 1 G capsule Take two capsules by mouth twice daily for cholesterol 05/30/15  Yes Tiffany L Reed, DO  Pramox-PE-Glycerin-Petrolatum (PREPARATION H) 1-0.25-14.4-15 % CREA Apply to hemorrhoids as needed for pain or itching 02/01/15  Yes Tiffany L Reed, DO  Probiotic Product (ALIGN) 4 MG CAPS Take 1 capsule by mouth daily. 01/08/15  Yes Tiffany L Reed, DO  rosuvastatin (CRESTOR) 10 MG tablet TAKE (1) TABLET BY MOUTH AT BEDTIME. 12/31/15  Yes Tiffany L Reed, DO  Wheat Dextrin (BENEFIBER) POWD Take by mouth 2 (two) times daily.    Yes Historical Provider, MD   BP 149/86 mmHg  Pulse 69  Temp(Src) 98 F (36.7 C) (Oral)  Resp 18  SpO2 93%  LMP 08/22/2011 Physical Exam  Constitutional: She appears well-developed and well-nourished. No distress.  HENT:  Head: Normocephalic and atraumatic.  Nose: Nose normal.  Mouth/Throat: Oropharynx is clear and moist. No oropharyngeal exudate.  Nasal cannula in place  Eyes: Conjunctivae and EOM are normal. Pupils are equal, round, and reactive to light. No scleral icterus.  Neck: Normal range of motion. Neck supple. No JVD present. No tracheal deviation present. No thyromegaly present.  Cardiovascular: Normal rate, regular rhythm and normal heart sounds.  Exam  reveals no gallop and no friction rub.   No murmur heard. Pulmonary/Chest: Effort normal and breath sounds normal. No respiratory distress. She has no wheezes. She exhibits no tenderness.  Abdominal: Soft. Bowel sounds are normal. She exhibits no distension and no mass. There is no tenderness. There is no rebound and no guarding.  Musculoskeletal: Normal range of motion. She exhibits tenderness. She exhibits no edema.  Diffuse muscle TTP  Diffuse body aches  Lymphadenopathy:    She has no cervical adenopathy.  Neurological: She is alert. No cranial nerve deficit. She exhibits normal muscle tone.  Easily arousable and  follows all commands  Skin: Skin is warm and dry. No rash noted. No erythema. No pallor.  Tactile fever  Nursing note and vitals reviewed.   ED Course  Procedures  DIAGNOSTIC STUDIES:  Oxygen Saturation is 93% on RA, normal by my interpretation.    COORDINATION OF CARE:  11:27 PM Discussed treatment plan with pt's daughter at bedside and she agreed to plan.  Labs Review Labs Reviewed  CBC WITH DIFFERENTIAL/PLATELET - Abnormal; Notable for the following:    RBC 3.73 (*)    HCT 35.2 (*)    All other components within normal limits  COMPREHENSIVE METABOLIC PANEL - Abnormal; Notable for the following:    Calcium 8.5 (*)    Albumin 3.4 (*)    Total Bilirubin 1.5 (*)    All other components within normal limits  URINALYSIS, ROUTINE W REFLEX MICROSCOPIC (NOT AT [facility]) - Abnormal; Notable for the following:    APPearance CLOUDY (*)    Leukocytes, UA LARGE (*)    All other components within normal limits  URINE MICROSCOPIC-ADD ON - Abnormal; Notable for the following:    Squamous Epithelial / LPF 6-30 (*)    Bacteria, UA MANY (*)    All other components within normal limits  I-STAT CHEM 8, ED - Abnormal; Notable for the following:    Calcium, Ion 1.07 (*)    Hemoglobin 11.9 (*)    HCT 35.0 (*)    All other components within normal limits  URINE CULTURE  CULTURE,  BLOOD (  ROUTINE X 2)  CULTURE, BLOOD (ROUTINE X 2)  LIPASE, BLOOD  PROTIME-INR  MAGNESIUM  CBG MONITORING, ED  Rosezena Sensor, ED    Imaging Review Dg Chest 2 View  04/08/2016  CLINICAL DATA:  Altered mental status. Generalized weakness and cough. Difficulty breathing for 5 days. EXAM: CHEST  2 VIEW COMPARISON:  08/20/2015 FINDINGS: Cardiomediastinal contours are unchanged with tortuosity of the thoracic aorta. Chronic bronchial thickening. No evidence of pulmonary edema. No confluent airspace disease, pleural effusion or pneumothorax. Calcified granuloma in both lung bases. No acute osseous abnormalities are seen. Degenerative change about both shoulders, left greater than right. IMPRESSION: Stable chronic bronchitic change. No acute abnormality seen radiographically. Electronically Signed   By: Rubye Oaks M.D.   On: 04/08/2016 23:24   I have personally reviewed and evaluated these images and lab results as part of my medical decision-making.   EKG Interpretation   Date/Time:  Tuesday April 08 2016 22:22:27 EDT Ventricular Rate:  70 PR Interval:    QRS Duration: 91 QT Interval:  422 QTC Calculation: 456 R Axis:   3 Text Interpretation:  Sinus rhythm No significant change since last  tracing Confirmed by Erroll Luna [phone]) on 04/08/2016 11:07:58 PM      MDM   Final diagnoses:  None   Patient presents to the ED for AMS.  She is hypoxic to 90% on RA, daughter states the baseline is 97%.  She has a tactile fever, will check a rectal temp.  I have concern for PNA although the CXR is negative.  The lateral views appear to show a consolidation.  Cultures obtained and abx given.  12:28 AM UA also reveals UTI.  Cefepime will cover it. Will page hospitalist for further care.   I personally performed the services described in this documentation, which was scribed in my presence. The recorded information has been reviewed and is accurate.      Tomasita Crumble, MD 04/09/16  [phone]

## 2016-04-09 ENCOUNTER — Ambulatory Visit: Payer: Medicare Other | Admitting: Internal Medicine

## 2016-04-09 ENCOUNTER — Encounter (HOSPITAL_COMMUNITY): Payer: Self-pay | Admitting: Internal Medicine

## 2016-04-09 DIAGNOSIS — I341 Nonrheumatic mitral (valve) prolapse: Secondary | ICD-10-CM | POA: Diagnosis present

## 2016-04-09 DIAGNOSIS — Z96641 Presence of right artificial hip joint: Secondary | ICD-10-CM | POA: Diagnosis present

## 2016-04-09 DIAGNOSIS — R627 Adult failure to thrive: Secondary | ICD-10-CM | POA: Diagnosis present

## 2016-04-09 DIAGNOSIS — M810 Age-related osteoporosis without current pathological fracture: Secondary | ICD-10-CM | POA: Diagnosis present

## 2016-04-09 DIAGNOSIS — T368X5A Adverse effect of other systemic antibiotics, initial encounter: Secondary | ICD-10-CM | POA: Diagnosis present

## 2016-04-09 DIAGNOSIS — K7689 Other specified diseases of liver: Secondary | ICD-10-CM | POA: Diagnosis present

## 2016-04-09 DIAGNOSIS — F0280 Dementia in other diseases classified elsewhere without behavioral disturbance: Secondary | ICD-10-CM | POA: Diagnosis present

## 2016-04-09 DIAGNOSIS — Z96653 Presence of artificial knee joint, bilateral: Secondary | ICD-10-CM | POA: Diagnosis present

## 2016-04-09 DIAGNOSIS — Z8249 Family history of ischemic heart disease and other diseases of the circulatory system: Secondary | ICD-10-CM | POA: Diagnosis not present

## 2016-04-09 DIAGNOSIS — R531 Weakness: Secondary | ICD-10-CM | POA: Diagnosis not present

## 2016-04-09 DIAGNOSIS — G20 Parkinson's disease: Secondary | ICD-10-CM | POA: Diagnosis present

## 2016-04-09 DIAGNOSIS — I110 Hypertensive heart disease with heart failure: Secondary | ICD-10-CM | POA: Diagnosis present

## 2016-04-09 DIAGNOSIS — G309 Alzheimer's disease, unspecified: Secondary | ICD-10-CM | POA: Diagnosis present

## 2016-04-09 DIAGNOSIS — I5032 Chronic diastolic (congestive) heart failure: Secondary | ICD-10-CM | POA: Diagnosis not present

## 2016-04-09 DIAGNOSIS — R0902 Hypoxemia: Secondary | ICD-10-CM | POA: Diagnosis present

## 2016-04-09 DIAGNOSIS — N3000 Acute cystitis without hematuria: Principal | ICD-10-CM | POA: Insufficient documentation

## 2016-04-09 DIAGNOSIS — R109 Unspecified abdominal pain: Secondary | ICD-10-CM | POA: Diagnosis present

## 2016-04-09 DIAGNOSIS — Z66 Do not resuscitate: Secondary | ICD-10-CM | POA: Diagnosis present

## 2016-04-09 DIAGNOSIS — E785 Hyperlipidemia, unspecified: Secondary | ICD-10-CM | POA: Diagnosis present

## 2016-04-09 DIAGNOSIS — Z7902 Long term (current) use of antithrombotics/antiplatelets: Secondary | ICD-10-CM | POA: Diagnosis not present

## 2016-04-09 DIAGNOSIS — Z881 Allergy status to other antibiotic agents status: Secondary | ICD-10-CM | POA: Diagnosis not present

## 2016-04-09 DIAGNOSIS — Z79899 Other long term (current) drug therapy: Secondary | ICD-10-CM | POA: Diagnosis not present

## 2016-04-09 DIAGNOSIS — N390 Urinary tract infection, site not specified: Secondary | ICD-10-CM | POA: Diagnosis present

## 2016-04-09 DIAGNOSIS — Z8673 Personal history of transient ischemic attack (TIA), and cerebral infarction without residual deficits: Secondary | ICD-10-CM | POA: Diagnosis not present

## 2016-04-09 DIAGNOSIS — E7800 Pure hypercholesterolemia, unspecified: Secondary | ICD-10-CM | POA: Diagnosis present

## 2016-04-09 DIAGNOSIS — L271 Localized skin eruption due to drugs and medicaments taken internally: Secondary | ICD-10-CM | POA: Diagnosis present

## 2016-04-09 DIAGNOSIS — Z882 Allergy status to sulfonamides status: Secondary | ICD-10-CM | POA: Diagnosis not present

## 2016-04-09 DIAGNOSIS — R1011 Right upper quadrant pain: Secondary | ICD-10-CM | POA: Diagnosis present

## 2016-04-09 LAB — CBC WITH DIFFERENTIAL/PLATELET
BASOS ABS: 0 10*3/uL (ref 0.0–0.1)
BASOS PCT: 0 %
EOS ABS: 0.2 10*3/uL (ref 0.0–0.7)
EOS PCT: 3 %
HCT: 36 % (ref 36.0–46.0)
Hemoglobin: 11.9 g/dL — ABNORMAL LOW (ref 12.0–15.0)
LYMPHS ABS: 2 10*3/uL (ref 0.7–4.0)
Lymphocytes Relative: 35 %
MCH: 32.4 pg (ref 26.0–34.0)
MCHC: 33.1 g/dL (ref 30.0–36.0)
MCV: 98.1 fL (ref 78.0–100.0)
Monocytes Absolute: 0.6 10*3/uL (ref 0.1–1.0)
Monocytes Relative: 10 %
NEUTROS PCT: 52 %
Neutro Abs: 3 10*3/uL (ref 1.7–7.7)
PLATELETS: 184 10*3/uL (ref 150–400)
RBC: 3.67 MIL/uL — AB (ref 3.87–5.11)
RDW: 13.1 % (ref 11.5–15.5)
WBC: 5.7 10*3/uL (ref 4.0–10.5)

## 2016-04-09 LAB — URINALYSIS, ROUTINE W REFLEX MICROSCOPIC
BILIRUBIN URINE: NEGATIVE
GLUCOSE, UA: NEGATIVE mg/dL
Hgb urine dipstick: NEGATIVE
KETONES UR: NEGATIVE mg/dL
Nitrite: NEGATIVE
PH: 6.5 (ref 5.0–8.0)
Protein, ur: NEGATIVE mg/dL
Specific Gravity, Urine: 1.022 (ref 1.005–1.030)

## 2016-04-09 LAB — COMPREHENSIVE METABOLIC PANEL
ALBUMIN: 3.4 g/dL — AB (ref 3.5–5.0)
ALK PHOS: 42 U/L (ref 38–126)
ALT: 14 U/L (ref 14–54)
ALT: 13 U/L — AB (ref 14–54)
AST: 32 U/L (ref 15–41)
AST: 23 U/L (ref 15–41)
Albumin: 3.1 g/dL — ABNORMAL LOW (ref 3.5–5.0)
Alkaline Phosphatase: 42 U/L (ref 38–126)
Anion gap: 6 (ref 5–15)
Anion gap: 6 (ref 5–15)
BILIRUBIN TOTAL: 1.5 mg/dL — AB (ref 0.3–1.2)
BILIRUBIN TOTAL: 1 mg/dL (ref 0.3–1.2)
BUN: 12 mg/dL (ref 6–20)
BUN: 12 mg/dL (ref 6–20)
CALCIUM: 8.5 mg/dL — AB (ref 8.9–10.3)
CALCIUM: 8.4 mg/dL — AB (ref 8.9–10.3)
CO2: 28 mmol/L (ref 22–32)
CO2: 27 mmol/L (ref 22–32)
CREATININE: 0.66 mg/dL (ref 0.44–1.00)
CREATININE: 0.68 mg/dL (ref 0.44–1.00)
Chloride: 102 mmol/L (ref 101–111)
Chloride: 107 mmol/L (ref 101–111)
GFR calc Af Amer: >60 mL/min (ref >60)
GFR calc non Af Amer: >60 mL/min (ref >60)
GLUCOSE: 89 mg/dL (ref 65–99)
Glucose, Bld: 80 mg/dL (ref 65–99)
Potassium: 3.8 mmol/L (ref 3.5–5.1)
Potassium: 3.5 mmol/L (ref 3.5–5.1)
Sodium: 136 mmol/L (ref 135–145)
Sodium: 140 mmol/L (ref 135–145)
TOTAL PROTEIN: 6.6 g/dL (ref 6.5–8.1)
TOTAL PROTEIN: 6.2 g/dL — AB (ref 6.5–8.1)

## 2016-04-09 LAB — URINE MICROSCOPIC-ADD ON

## 2016-04-09 LAB — MAGNESIUM: Magnesium: 2.1 mg/dL (ref 1.7–2.4)

## 2016-04-09 LAB — LIPASE, BLOOD: Lipase: 31 U/L (ref 11–51)

## 2016-04-09 MED ORDER — RIVASTIGMINE 4.6 MG/24HR TD PT24
4.6000 mg | MEDICATED_PATCH | Freq: Every day | TRANSDERMAL | Status: DC
  Administered 2016-04-09 – 2016-04-10 (×2): 4.6 mg via TRANSDERMAL
  Filled 2016-04-09 (×2): qty 1

## 2016-04-09 MED ORDER — CITALOPRAM HYDROBROMIDE 20 MG PO TABS
20.0000 mg | ORAL_TABLET | Freq: Every day | ORAL | Status: DC
  Administered 2016-04-09 – 2016-04-10 (×2): 20 mg via ORAL
  Filled 2016-04-09 (×2): qty 1

## 2016-04-09 MED ORDER — ACETAMINOPHEN 325 MG PO TABS
650.0000 mg | ORAL_TABLET | Freq: Four times a day (QID) | ORAL | Status: DC | PRN
Start: 2016-04-09 — End: 2016-04-10

## 2016-04-09 MED ORDER — HALOPERIDOL 0.5 MG PO TABS
0.5000 mg | ORAL_TABLET | Freq: Every day | ORAL | Status: DC | PRN
  Filled 2016-04-09: qty 1

## 2016-04-09 MED ORDER — ALBUTEROL SULFATE (2.5 MG/3ML) 0.083% IN NEBU: 1.5000 mL | INHALATION_SOLUTION | Freq: Four times a day (QID) | RESPIRATORY_TRACT | Status: DC | PRN

## 2016-04-09 MED ORDER — ONDANSETRON HCL 4 MG/2ML IJ SOLN: 4.0000 mg | Freq: Four times a day (QID) | INTRAMUSCULAR | Status: DC | PRN

## 2016-04-09 MED ORDER — PSYLLIUM 95 % PO PACK
PACK | Freq: Two times a day (BID) | ORAL | Status: DC
  Administered 2016-04-09 – 2016-04-10 (×3): via ORAL
  Filled 2016-04-09 (×3): qty 1

## 2016-04-09 MED ORDER — ROSUVASTATIN CALCIUM 10 MG PO TABS
10.0000 mg | ORAL_TABLET | Freq: Every day | ORAL | Status: DC
  Administered 2016-04-09: 10 mg via ORAL
  Filled 2016-04-09 (×2): qty 1

## 2016-04-09 MED ORDER — GUAIFENESIN ER 600 MG PO TB12
1200.0000 mg | ORAL_TABLET | Freq: Every day | ORAL | Status: DC
  Administered 2016-04-09 – 2016-04-10 (×2): 1200 mg via ORAL
  Filled 2016-04-09 (×2): qty 2

## 2016-04-09 MED ORDER — MELATONIN 1 MG/4ML PO LIQD: 1.0000 mL | Freq: Every evening | ORAL | Status: DC | PRN

## 2016-04-09 MED ORDER — VITAMIN D 1000 UNITS PO TABS
2000.0000 [IU] | ORAL_TABLET | Freq: Every day | ORAL | Status: DC
  Administered 2016-04-09 – 2016-04-10 (×2): 2000 [IU] via ORAL
  Filled 2016-04-09 (×2): qty 2

## 2016-04-09 MED ORDER — FLUTICASONE PROPIONATE 0.05 % EX CREA: 1.0000 "application " | TOPICAL_CREAM | Freq: Two times a day (BID) | CUTANEOUS | Status: DC

## 2016-04-09 MED ORDER — ACETAMINOPHEN 650 MG RE SUPP: 650.0000 mg | Freq: Four times a day (QID) | RECTAL | Status: DC | PRN

## 2016-04-09 MED ORDER — PANTOPRAZOLE SODIUM 40 MG PO TBEC
40.0000 mg | DELAYED_RELEASE_TABLET | Freq: Every day | ORAL | Status: DC
  Administered 2016-04-09 – 2016-04-10 (×2): 40 mg via ORAL
  Filled 2016-04-09 (×2): qty 1

## 2016-04-09 MED ORDER — CIPROFLOXACIN IN D5W 400 MG/200ML IV SOLN
400.0000 mg | Freq: Two times a day (BID) | INTRAVENOUS | Status: DC
  Administered 2016-04-09 – 2016-04-10 (×3): 400 mg via INTRAVENOUS
  Filled 2016-04-09 (×3): qty 200

## 2016-04-09 MED ORDER — CLOPIDOGREL BISULFATE 75 MG PO TABS
75.0000 mg | ORAL_TABLET | Freq: Every day | ORAL | Status: DC
Start: 2016-04-09 — End: 2016-04-10
  Administered 2016-04-09 – 2016-04-10 (×2): 75 mg via ORAL
  Filled 2016-04-09 (×2): qty 1

## 2016-04-09 MED ORDER — OMEGA-3-ACID ETHYL ESTERS 1 G PO CAPS
2.0000 g | ORAL_CAPSULE | Freq: Every day | ORAL | Status: DC
  Administered 2016-04-09 – 2016-04-10 (×2): 2 g via ORAL
  Filled 2016-04-09 (×2): qty 2

## 2016-04-09 MED ORDER — MIRABEGRON ER 50 MG PO TB24
50.0000 mg | ORAL_TABLET | Freq: Every day | ORAL | Status: DC
  Administered 2016-04-09 – 2016-04-10 (×2): 50 mg via ORAL
  Filled 2016-04-09 (×2): qty 1

## 2016-04-09 MED ORDER — ADULT MULTIVITAMIN W/MINERALS CH
ORAL_TABLET | ORAL | Status: DC
  Administered 2016-04-10: 1 via ORAL
  Filled 2016-04-09 (×3): qty 1

## 2016-04-09 MED ORDER — ENOXAPARIN SODIUM 40 MG/0.4ML ~~LOC~~ SOLN
40.0000 mg | SUBCUTANEOUS | Status: DC
  Administered 2016-04-09 – 2016-04-10 (×2): 40 mg via SUBCUTANEOUS
  Filled 2016-04-09 (×2): qty 0.4

## 2016-04-09 MED ORDER — ONDANSETRON HCL 4 MG PO TABS: 4.0000 mg | ORAL_TABLET | Freq: Four times a day (QID) | ORAL | Status: DC | PRN

## 2016-04-09 MED ORDER — DOCUSATE SODIUM 100 MG PO CAPS
100.0000 mg | ORAL_CAPSULE | Freq: Every day | ORAL | Status: DC
  Administered 2016-04-09: 100 mg via ORAL
  Filled 2016-04-09: qty 1

## 2016-04-09 MED ORDER — DIPHENHYDRAMINE HCL 50 MG/ML IJ SOLN
25.0000 mg | Freq: Once | INTRAMUSCULAR | Status: AC
  Administered 2016-04-09: 25 mg via INTRAVENOUS
  Filled 2016-04-09: qty 1

## 2016-04-09 MED ORDER — FUROSEMIDE 20 MG PO TABS
20.0000 mg | ORAL_TABLET | Freq: Every day | ORAL | Status: DC
  Administered 2016-04-09 – 2016-04-10 (×2): 20 mg via ORAL
  Filled 2016-04-09 (×2): qty 1

## 2016-04-09 MED ORDER — CARVEDILOL 3.125 MG PO TABS
3.1250 mg | ORAL_TABLET | Freq: Every day | ORAL | Status: DC
  Administered 2016-04-09: 3.125 mg via ORAL
  Filled 2016-04-09: qty 1

## 2016-04-09 MED ORDER — MEMANTINE HCL ER 28 MG PO CP24
28.0000 mg | ORAL_CAPSULE | Freq: Every day | ORAL | Status: DC
  Administered 2016-04-09 – 2016-04-10 (×2): 28 mg via ORAL
  Filled 2016-04-09 (×2): qty 1

## 2016-04-09 MED ORDER — VITAMIN C 500 MG PO TABS
1000.0000 mg | ORAL_TABLET | Freq: Every day | ORAL | Status: DC
  Administered 2016-04-09 – 2016-04-10 (×2): 1000 mg via ORAL
  Filled 2016-04-09 (×2): qty 2

## 2016-04-09 MED ORDER — RISAQUAD PO CAPS
1.0000 | ORAL_CAPSULE | Freq: Every day | ORAL | Status: DC
  Administered 2016-04-09 – 2016-04-10 (×2): 1 via ORAL
  Filled 2016-04-09 (×2): qty 1

## 2016-04-09 NOTE — Progress Notes (Signed)
04/09/2016 8:57 AM  Attending Progress Note  Pt was seen and examined.  H&P and orders reviewed.  Labs and imaging reviewed.  Continue current orders.  MRI pending.   Maryln Manuel. Johnson, MD

## 2016-04-09 NOTE — Evaluation (Signed)
Physical Therapy Evaluation Patient Details Name: Holly Grimes MRN: [id number] DOB: 18-Aug-1927 Today's Date: 04/09/2016   History of Present Illness  Holly Grimes is a 82 y.o. female admitted on 04/08/2016 with UTI. Pti s from Ind living - but has 24/7 A  Clinical Impression  Pt admitted with above diagnosis. Pt currently with functional limitations due to the deficits listed below (see PT Problem List).   Pt will benefit from skilled PT to increase their independence and safety with mobility to allow discharge to the venue listed below.       Follow Up Recommendations SNF;Supervision/Assistance - 24 hour (vs home with 24hr assist)    Equipment Recommendations  None recommended by PT    Recommendations for Other Services       Precautions / Restrictions Precautions Precautions: Fall Restrictions Weight Bearing Restrictions: No      Mobility  Bed Mobility               General bed mobility comments: pt began transition to EOB and then refused to move any further even with assist; provided much encouragement, caregiver tried as well; pt continued to refuse; (pt was +2 max assist during OT Eval)  Transfers                    Ambulation/Gait                Stairs            Wheelchair Mobility    Modified Rankin (Stroke Patients Only)       Balance Overall balance assessment:  (unable to test, pt uncooperative)                                           Pertinent Vitals/Pain Pain Assessment: No/denies pain    Home Living Family/patient expects to be discharged to:: Other (Comment) (?I-living with 24hr care)               Home Equipment: Dan HumphreysWalker - 2 wheels Additional Comments: Ind living with 24/7 A    Prior Function Level of Independence: Needs assistance   Gait / Transfers Assistance Needed: walked short distances with walker. used wheelchair for longer distances           Hand Dominance         Extremity/Trunk Assessment   Upper Extremity Assessment: Defer to OT evaluation;Generalized weakness           Lower Extremity Assessment: Generalized weakness         Communication   Communication: HOH  Cognition Arousal/Alertness: Awake/alert Behavior During Therapy: Flat affect Overall Cognitive Status: Impaired/Different from baseline Area of Impairment: Orientation;Safety/judgement Orientation Level: Disoriented to;Place;Time;Situation   Memory: Decreased short-term memory   Safety/Judgement: Decreased awareness of safety;Decreased awareness of deficits     General Comments: pt states she has been up walking all morning; report she needs to walk to post office but declines OOB (citing the reason as bc this therapist is "too skinny and won't make it") ; pt grimaces and makes faces at PT  and tech, brushes us and her caregiver away with her hand    General Comments      Exercises        Assessment/Plan    PT Assessment Patient needs continued PT services  PT Diagnosis Generalized weakness;Altered mental status   PT Problem  List Decreased strength;Decreased activity tolerance;Decreased balance;Decreased mobility;Decreased cognition  PT Treatment Interventions DME instruction;Functional mobility training;Therapeutic activities;Therapeutic exercise;Patient/family education   PT Goals (Current goals can be found in the Care Plan section) Acute Rehab PT Goals Patient Stated Goal: did not state PT Goal Formulation: Patient unable to participate in goal setting Time For Goal Achievement: 04/23/16 Potential to Achieve Goals: Fair    Frequency Min 2X/week   Barriers to discharge        Co-evaluation               End of Session   Activity Tolerance: Other (comment) (limited by cognition) Patient left: in bed;with call bell/phone within reach;with chair alarm set;with nursing/sitter in room Curator(private sitter) Nurse Communication: Mobility status          Time: [id number] PT Time Calculation (min) (ACUTE ONLY): 12 min   Charges:   PT Evaluation $PT Eval Low Complexity: 1 Procedure     PT G Codes:        Elleigh Cassetta 04/09/2016, 5:06 PM

## 2016-04-09 NOTE — Progress Notes (Addendum)
LCSWA [facility] Will continue to support and follow.  Vivi BarrackNicole Sinclair, Theresia MajorsLCSWA, MSW Clinical Social Worker 5E and Psychiatric Service Line [phone] 04/09/2016  12:28 PM

## 2016-04-09 NOTE — Progress Notes (Signed)
Pharmacy Antibiotic Note  Holly Grimes is a 82 y.o. female admitted on 04/08/2016 with UTI.  Pharmacy has been consulted for cipro dosing.  Plan: Cipro 400mg  IV q12h     Temp (24hrs), Avg:98.3 F (36.8 C), Min:98 F (36.7 C), Max:98.6 F (37 C)   Recent Labs Lab 04/08/16 2323 04/08/16 2332  WBC 5.4  --   CREATININE 0.66 0.80    CrCl cannot be calculated (Unknown ideal weight.).    Allergies  Allergen Reactions  . Cefepime Itching    Redness to forehead  . Cephalexin Nausea And Vomiting  . Sulfa Antibiotics Other (See Comments)    Daughter unsure of reaction, but believes it is severe  . Sulfites     unknown    Antimicrobials this admission: 7/5 cefepime >> 7/5 >itching 7/5 vancomycin >> x1 ED 7/5 cipro >>  Dose adjustments this admission:   Microbiology results:  BCx:   UCx:    Sputum:    MRSA PCR:   Thank you for allowing pharmacy to be a part of this patient's care.  Lorenza EvangelistGreen, Sharyn Brilliant R 04/09/2016 4:30 AM

## 2016-04-09 NOTE — Progress Notes (Signed)
Patient arrived to unit at 0134. Patient has been complaining of itching since receiving IV maxipme in ED per patient's daughter. NT noticed redness to patient's forehead. Primary notified Hospitalist. Hospitalist gave verbal order for one time dose of IV benadryl 25 mg. Benadryl given at 0215. Patient now asleep. Will continue to monitor.

## 2016-04-09 NOTE — Evaluation (Signed)
Occupational Therapy Evaluation Patient Details Name: Holly Grimes MRN: [id number] DOB: 1927/06/19 Today's Date: 04/09/2016    History of Present Illness Holly Grimes is a 82 y.o. female admitted on 04/08/2016 with UTI. Pti s from Ind living - but has 24/7 A   Clinical Impression   Pt admitted with UTI. Pt currently with functional limitations due to the deficits listed below (see OT Problem List).  Pt will benefit from skilled OT to increase their safety and independence with ADL and functional mobility for ADL to facilitate discharge to venue listed below.      Follow Up Recommendations  SNF;Supervision/Assistance - 24 hour;Home health OT;Other (comment) (depending on progress)    Equipment Recommendations  None recommended by OT       Precautions / Restrictions Precautions Precautions: Fall      Mobility Bed Mobility Overal bed mobility: Needs Assistance Bed Mobility: Rolling;Supine to Sit;Sit to Supine Rolling: Max assist;+2 for physical assistance;+2 for safety/equipment   Supine to sit: Max assist;+2 for physical assistance;+2 for safety/equipment Sit to supine: Max assist;+2 for physical assistance;+2 for safety/equipment      Transfers                 General transfer comment: did not perform         ADL Overall ADL's : Needs assistance/impaired       Grooming Details (indicate cue type and reason): caregiver feeding pt upon OT arrival Upper Body Bathing: Bed level;Maximal assistance   Lower Body Bathing: Bed level;Total assistance   Upper Body Dressing : Maximal assistance;Bed level         Toilet Transfer Details (indicate cue type and reason): pt urinated in bed- max A/total A for bed mobility to change pt Toileting- Clothing Manipulation and Hygiene: Bed level;+2 for physical assistance;+2 for safety/equipment;Total assistance         General ADL Comments: Dghter and caregiver present for eval.  Discussed in details DC options  and  level of care.  SW made aware of conversation               Pertinent Vitals/Pain Pain Assessment: Faces Faces Pain Scale: Hurts whole lot Pain Location: with rolling for linen change Pain Descriptors / Indicators: Sore Pain Intervention(s): Monitored during session;Repositioned        Extremity/Trunk Assessment Upper Extremity Assessment Upper Extremity Assessment: Generalized weakness           Communication Communication Communication: HOH   Cognition Arousal/Alertness: Lethargic Behavior During Therapy: Anxious Overall Cognitive Status: Impaired/Different from baseline                     General Comments   dghter wants pt to return to ILF with 24/7 A but not sure pt will be able to be assisted by 1 person.  OT did share this concern with dghter. She is looking in to options for increased A in her apartment               Prior Functioning/Environment Level of Independence: Needs assistance  Gait / Transfers Assistance Needed: walked short distances with walker. used wheelchair for longer distances          OT Diagnosis: Generalized weakness;Acute pain   OT Problem List: Decreased strength;Decreased activity tolerance;Decreased safety awareness   OT Treatment/Interventions: Self-care/ADL training;Patient/family education;DME and/or AE instruction    OT Goals(Current goals can be found in the care plan section) Acute Rehab OT Goals Patient Stated Goal: did not  state OT Goal Formulation: With patient Time For Goal Achievement: 04/23/16 Potential to Achieve Goals: Fair  OT Frequency: Min 2X/week              End of Session Nurse Communication: Mobility status  Activity Tolerance: Patient limited by fatigue Patient left: in bed;with call bell/phone within reach;with nursing/sitter in room;with family/visitor present   Time: [id number] OT Time Calculation (min): 50 min Charges:  OT General Charges $OT Visit: 1 Procedure OT Evaluation $OT  Eval Moderate Complexity: 1 Procedure OT Treatments $Self Care/Home Management : 23-37 mins G-Codes: OT G-codes **NOT FOR INPATIENT CLASS** Functional Assessment Tool Used: clinical observation Functional Limitation: Self care Self Care Current Status (Y7829(G8987): At least 80 percent but less than 100 percent impaired, limited or restricted Self Care Goal Status (F6213(G8988): At least 1 percent but less than 20 percent impaired, limited or restricted  Shalayna Ornstein, Metro KungLorraine D 04/09/2016, 12:25 PM

## 2016-04-09 NOTE — Progress Notes (Signed)
PHARMACIST - PHYSICIAN ORDER COMMUNICATION  CONCERNING: P&T Medication Policy on Herbal Medications  DESCRIPTION:  This patient's order for:  melatonin  has been noted.  This product(s) is classified as an "herbal" or natural product. Due to a lack of definitive safety studies or FDA approval, nonstandard manufacturing practices, plus the potential risk of unknown drug-drug interactions while on inpatient medications, the Pharmacy and Therapeutics Committee does not permit the use of "herbal" or natural products of this type within [facility].   ACTION TAKEN: The pharmacy department is unable to verify this order at this time and your patient has been informed of this safety policy. Please reevaluate patient's clinical condition at discharge and address if the herbal or natural product(s) should be resumed at that time.   Thanks Lorenza EvangelistGreen, Hersh Minney R 04/09/2016  4:26 AM

## 2016-04-09 NOTE — H&P (Signed)
History and Physical    Holly SchwalbeJoan M Ransford ZOX:[id number] DOB: 1927/05/28 DOA: 04/08/2016  PCP: Bufford SpikesEED, TIFFANY, DO  Patient coming from: Independent living facility.  History obtained from patient's daughter.  Chief Complaint: Weakness and doing poorly.  HPI: Holly SchwalbeJoan M Grimes is a 82 y.o. female with advanced dementia, CHF, hypertension was brought to the ER after patient was found to be weak and doing poorly. As per patient's daughter last week patient was given an extra dose of Lasix for CHF. Patient also had complained of right upper quadrant pain 3 days ago, which resolved after patient had large bowel movement. Did not have any nausea vomiting or diarrhea. Since patient has been generally weak patient was brought to the ER. UA shows features concerning for UTI. Patient did not have any productive cough or chest pain. Patient has been admitted for further observation.   ED Course: Patient was started on vancomycin and cefepime in the ER following which patient had developed rash on the face after receiving cefepime. This has been discontinued and I have alerted pharmacy about cefepime allergy and IV Benadryl one dose was given.  Review of Systems: As per HPI, rest all negative.   Past Medical History  Diagnosis Date  . Hypertension   . High cholesterol   . Mitral valve prolapse   . Dementia   . Parkinson's disease, Lewy body (HCC)   . Unspecified constipation   . Onychia and paronychia of toe   . Spasm of muscle   . Abnormality of gait   . Orthostatic hypotension   . Major depressive disorder, single episode, unspecified (HCC)   . Diaphragmatic hernia without mention of obstruction or gangrene   . Alzheimer's disease   . Osteoarthrosis, unspecified whether generalized or localized, unspecified site   . Osteoporosis, unspecified   . Unspecified constipation   . Sprain of ribs   . Delirium due to conditions classified elsewhere   . Diarrhea   . Edema   . Pain in joint, pelvic region  and thigh   . Other malaise and fatigue   . Urinary frequency [id number]  . Personal history of fall   . Female stress incontinence   . Other and unspecified hyperlipidemia   . Obesity, unspecified   . Osteoarthrosis, unspecified whether generalized or localized, unspecified site   . Pain in joint, shoulder region   . Transient ischemic attack (TIA), and cerebral infarction without residual deficits   . Risk for falls     Past Surgical History  Procedure Laterality Date  . Joint replacement    . Tonsillectomy    . Ankle surgery Left 1998  . Total knee arthroplasty Right 2002  . Total knee arthroplasty Left 2005  . Hip arthroplasty Right 09/24/2013    Procedure: ARTHROPLASTY BIPOLAR HIP;  Surgeon: Loanne DrillingFrank V Aluisio, MD;  Location: [facility];  Service: Orthopedics;  Laterality: Right;     reports that she has never smoked. She has never used smokeless tobacco. She reports that she does not drink alcohol or use illicit drugs.  Allergies  Allergen Reactions  . Cefepime Itching    Redness to forehead  . Cephalexin Nausea And Vomiting  . Sulfa Antibiotics Other (See Comments)    Daughter unsure of reaction, but believes it is severe  . Sulfites     unknown    Family History  Problem Relation Age of Onset  . Adopted: Yes  . Hypertension Other     Prior to Admission medications  Medication Sig Start Date End Date Taking? Authorizing Provider  acetaminophen (TYLENOL) 650 MG CR tablet Take 1,300 mg by mouth 2 (two) times daily.    Yes Historical Provider, MD  albuterol (ACCUNEB) 1.25 MG/3ML nebulizer solution Take 3 mLs (1.25 mg total) by nebulization every 6 (six) hours as needed for wheezing. 01/08/15  Yes Tiffany L Reed, DO  Ascorbic Acid (VITAMIN C WITH ROSE HIPS) 1000 MG tablet Take 1,000 mg by mouth daily.     Yes Historical Provider, MD  CALCIUM CITRATE-VITAMIN D PO Take by mouth 2 (two) times daily.    Yes Historical Provider, MD  carvedilol (COREG) 3.125 MG tablet Take 1  tablet (3.125 mg total) by mouth at bedtime. 03/17/16  Yes Tiffany L Reed, DO  Cholecalciferol (VITAMIN D3) 2000 UNITS TABS Take 2,000 Units by mouth daily. 01/08/15  Yes Tiffany L Reed, DO  citalopram (CELEXA) 20 MG tablet TAKE 1 TABLET BY MOUTH ONCE A DAY. 12/31/15  Yes Tiffany L Reed, DO  clopidogrel (PLAVIX) 75 MG tablet TAKE 1 TABLET BY MOUTH ONCE A DAY TO HELP CIRCULATION. 12/31/15  Yes Tiffany L Reed, DO  docusate sodium (COLACE) 100 MG capsule Take 100 mg by mouth at bedtime.   Yes Historical Provider, MD  esomeprazole (NEXIUM) 40 MG capsule Take 1 capsule (40 mg total) by mouth daily at 12 noon. 03/17/16  Yes Tiffany L Reed, DO  EXELON 4.6 MG/24HR APPLY 1 PATCH TO SKIN DAILY.REMOVE USED PATCH. 12/31/15  Yes Tiffany L Reed, DO  feeding supplement (BOOST HIGH PROTEIN) LIQD Take 1 Container by mouth as needed.    Yes Historical Provider, MD  fluticasone (CUTIVATE) 0.05 % cream Apply 1 application topically 2 (two) times daily. To affected areas as needed for skin irritaion 01/08/15  Yes Tiffany L Reed, DO  furosemide (LASIX) 20 MG tablet TAKE 1 TAB BY MOUTH ONCE DAILY UNLESS WEIGHT GAIN OF 3 LBS IN 1 DAY OR 5 LBS IN 1 WEEK,TAKE 2 TABS BY MOUTH AS NEEDED FOR SWELLING. 12/31/15  Yes Tiffany L Reed, DO  Guaifenesin (MUCINEX MAXIMUM STRENGTH) 1200 MG TB12 Take 1,200 mg by mouth daily. Reported on 11/06/2015   Yes Historical Provider, MD  haloperidol (HALDOL) 0.5 MG tablet Take one tablet by mouth once daily as needed for agitation/psychosis 01/08/15  Yes Tiffany L Reed, DO  Melatonin 1 MG/4ML LIQD Take 1 mL (0.25 mg total) by mouth at bedtime as needed (insomnia). 02/01/15  Yes Tiffany L Reed, DO  miconazole (LOTRIMIN AF) 2 % powder Apply topically as needed for itching (use beneath skin folds as needed). 02/01/15  Yes Tiffany L Reed, DO  mirabegron ER (MYRBETRIQ) 50 MG TB24 tablet Take one tablet by mouth once daily for bladder 02/19/16  Yes Tiffany L Reed, DO  Multiple Vitamins-Minerals (CENTRUM SILVER ULTRA  WOMENS PO) Take 1 tablet by mouth every morning.     Yes Historical Provider, MD  NAMENDA XR 28 MG CP24 24 hr capsule TAKE 1 CAPSULE BY MOUTH EVERY DAY FOR MEMORY. 12/31/15  Yes Tiffany L Reed, DO  nystatin (MYCOSTATIN/NYSTOP) 100000 UNIT/GM POWD 2 times a day for 2 week to affected area Patient taking differently: Apply 1 g topically 2 (two) times daily as needed (for irration).  06/25/15  Yes Tiffany L Reed, DO  omega-3 acid ethyl esters (LOVAZA) 1 G capsule Take two capsules by mouth twice daily for cholesterol 05/30/15  Yes Tiffany L Reed, DO  Pramox-PE-Glycerin-Petrolatum (PREPARATION H) 1-0.25-14.4-15 % CREA Apply to hemorrhoids as needed for  pain or itching 02/01/15  Yes Tiffany L Reed, DO  Probiotic Product (ALIGN) 4 MG CAPS Take 1 capsule by mouth daily. 01/08/15  Yes Tiffany L Reed, DO  rosuvastatin (CRESTOR) 10 MG tablet TAKE (1) TABLET BY MOUTH AT BEDTIME. 12/31/15  Yes Tiffany L Reed, DO  Wheat Dextrin (BENEFIBER) POWD Take by mouth 2 (two) times daily.    Yes Historical Provider, MD    Physical Exam: Filed Vitals:   04/09/16 0040 04/09/16 0046 04/09/16 0100 04/09/16 0134  BP: 143/73 143/73 123/75 151/77  Pulse:  71 72 67  Temp:    98.6 F (37 C)  TempSrc:    Axillary  Resp: 16 14 16 16   SpO2:  98% 97% 99%      Constitutional: Not in distress. Filed Vitals:   04/09/16 0040 04/09/16 0046 04/09/16 0100 04/09/16 0134  BP: 143/73 143/73 123/75 151/77  Pulse:  71 72 67  Temp:    98.6 F (37 C)  TempSrc:    Axillary  Resp: 16 14 16 16   SpO2:  98% 97% 99%   Eyes: Anicteric no pallor. ENMT: No discharge from the ears eyes nose and mouth. Neck: No mass felt. No JVD appreciated. Respiratory: No rhonchi or crepitations. Cardiovascular: S1 and S2 heard. Abdomen: Soft nontender bowel sounds present. No guarding or rigidity. Musculoskeletal: No edema. Skin: No rash. Neurologic: Alert awake oriented to her name. Psychiatric: Patient has dementia.   Labs on Admission: I have  personally reviewed following labs and imaging studies  CBC:  Recent Labs Lab 04/08/16 2323 04/08/16 2332  WBC 5.4  --   NEUTROABS 2.8  --   HGB 12.0 11.9*  HCT 35.2* 35.0*  MCV 94.4  --   PLT 195  --    Basic Metabolic Panel:  Recent Labs Lab 04/08/16 2323 04/08/16 2332  NA 136 141  K 3.8 4.0  CL 102 104  CO2 28  --   GLUCOSE 89 86  BUN 12 14  CREATININE 0.66 0.80  CALCIUM 8.5*  --   MG 2.1  --    GFR: CrCl cannot be calculated (Unknown ideal weight.). Liver Function Tests:  Recent Labs Lab 04/08/16 2323  AST 32  ALT 14  ALKPHOS 42  BILITOT 1.5*  PROT 6.6  ALBUMIN 3.4*    Recent Labs Lab 04/08/16 2323  LIPASE 31   No results for input(s): AMMONIA in the last 168 hours. Coagulation Profile:  Recent Labs Lab 04/08/16 2323  INR 1.05   Cardiac Enzymes: No results for input(s): CKTOTAL, CKMB, CKMBINDEX, TROPONINI in the last 168 hours. BNP (last 3 results) No results for input(s): PROBNP in the last 8760 hours. HbA1C: No results for input(s): HGBA1C in the last 72 hours. CBG:  Recent Labs Lab 04/08/16 2228  GLUCAP 88   Lipid Profile: No results for input(s): CHOL, HDL, LDLCALC, TRIG, CHOLHDL, LDLDIRECT in the last 72 hours. Thyroid Function Tests: No results for input(s): TSH, T4TOTAL, FREET4, T3FREE, THYROIDAB in the last 72 hours. Anemia Panel: No results for input(s): VITAMINB12, FOLATE, FERRITIN, TIBC, IRON, RETICCTPCT in the last 72 hours. Urine analysis:    Component Value Date/Time   COLORURINE YELLOW 04/09/2016 0001   APPEARANCEUR CLOUDY* 04/09/2016 0001   APPEARANCEUR Clear 06/06/2014 1722   LABSPEC 1.022 04/09/2016 0001   PHURINE 6.5 04/09/2016 0001   GLUCOSEU NEGATIVE 04/09/2016 0001   HGBUR NEGATIVE 04/09/2016 0001   BILIRUBINUR NEGATIVE 04/09/2016 0001   BILIRUBINUR negative 05/15/2015 0814   BILIRUBINUR Negative 06/06/2014  1722   KETONESUR NEGATIVE 04/09/2016 0001   PROTEINUR NEGATIVE 04/09/2016 0001   PROTEINUR 30  05/15/2015 0814   PROTEINUR Negative 06/06/2014 1722   UROBILINOGEN 0.2 08/20/2015 1700   UROBILINOGEN 0.2 05/15/2015 0814   NITRITE NEGATIVE 04/09/2016 0001   NITRITE negative 05/15/2015 0814   NITRITE Negative 06/06/2014 1722   LEUKOCYTESUR LARGE* 04/09/2016 0001   LEUKOCYTESUR 2+* 06/06/2014 1722   Sepsis Labs: (procalcitonin:4,lacticidven:4) )No results found for this or any previous visit (from the past 240 hour(s)).   Radiological Exams on Admission: Dg Chest 2 View  04/08/2016  CLINICAL DATA:  Altered mental status. Generalized weakness and cough. Difficulty breathing for 5 days. EXAM: CHEST  2 VIEW COMPARISON:  08/20/2015 FINDINGS: Cardiomediastinal contours are unchanged with tortuosity of the thoracic aorta. Chronic bronchial thickening. No evidence of pulmonary edema. No confluent airspace disease, pleural effusion or pneumothorax. Calcified granuloma in both lung bases. No acute osseous abnormalities are seen. Degenerative change about both shoulders, left greater than right. IMPRESSION: Stable chronic bronchitic change. No acute abnormality seen radiographically. Electronically Signed   By: Rubye Oaks M.D.   On: 04/08/2016 23:24    Assessment/Plan Principal Problem:   Weakness Active Problems:   Parkinson's disease, Lewy body (HCC)   Chronic diastolic heart failure (HCC)   UTI (lower urinary tract infection)   Abdominal discomfort   Acute cystitis without hematuria    1. Generalized weakness probably from deconditioning and UTI - patient developed rash on the face after receiving cefepime for which I have alerted pharmacy. I have placed patient on Cipro for now. Follow urine cultures. One dose of IV Benadryl was given. 2. Right upper quadrant pain which has resolved at this time - since patients CT scan in November 2016 showed hepatic cyst patient's daughter is requesting MRI to further study on this which has been ordered. 3. CHF chronic diastolic appears  compensated last EF measured was in 2015 - 55% with grade 2 diastolic dysfunction - continue Lasix. 4. Hypertension - continue Coreg. 5. Advanced dementia - on Namenda and Exelon patch.   DVT prophylaxis: Lovenox. Code Status: DO NOT RESUSCITATE.  Family Communication: Patient's daughter.  Disposition Plan: Independent living facility.  Consults called: None.  Admission status: Observation.    Eduard Clos MD [facility] Pager [phone].  If 7PM-7AM, please contact night-coverage [email or web address] Password [email or web address]  04/09/2016, 4:19 AM

## 2016-04-10 DIAGNOSIS — N390 Urinary tract infection, site not specified: Secondary | ICD-10-CM

## 2016-04-10 DIAGNOSIS — G3183 Dementia with Lewy bodies: Secondary | ICD-10-CM

## 2016-04-10 DIAGNOSIS — I5032 Chronic diastolic (congestive) heart failure: Secondary | ICD-10-CM

## 2016-04-10 LAB — BASIC METABOLIC PANEL
Anion gap: 6 (ref 5–15)
BUN: 16 mg/dL (ref 6–20)
CHLORIDE: 106 mmol/L (ref 101–111)
CO2: 26 mmol/L (ref 22–32)
CREATININE: 0.96 mg/dL (ref 0.44–1.00)
Calcium: 8.4 mg/dL — ABNORMAL LOW (ref 8.9–10.3)
GFR calc Af Amer: >60 mL/min (ref >60)
GFR calc non Af Amer: 52 mL/min — ABNORMAL LOW (ref >60)
Glucose, Bld: 101 mg/dL — ABNORMAL HIGH (ref 65–99)
Potassium: 3.6 mmol/L (ref 3.5–5.1)
Sodium: 138 mmol/L (ref 135–145)

## 2016-04-10 LAB — CBC
HCT: 33.8 % — ABNORMAL LOW (ref 36.0–46.0)
Hemoglobin: 11.3 g/dL — ABNORMAL LOW (ref 12.0–15.0)
MCH: 32.7 pg (ref 26.0–34.0)
MCHC: 33.4 g/dL (ref 30.0–36.0)
MCV: 97.7 fL (ref 78.0–100.0)
PLATELETS: 179 10*3/uL (ref 150–400)
RBC: 3.46 MIL/uL — AB (ref 3.87–5.11)
RDW: 12.9 % (ref 11.5–15.5)
WBC: 5.9 10*3/uL (ref 4.0–10.5)

## 2016-04-10 LAB — URINE CULTURE: Culture: NO GROWTH

## 2016-04-10 MED ORDER — CIPROFLOXACIN HCL 500 MG PO TABS: 500.0000 mg | ORAL_TABLET | Freq: Two times a day (BID) | ORAL | Status: DC

## 2016-04-10 MED ORDER — CIPROFLOXACIN HCL 500 MG PO TABS: 500.0000 mg | ORAL_TABLET | Freq: Two times a day (BID) | ORAL | Status: AC

## 2016-04-10 NOTE — Progress Notes (Signed)
Patient given discharge instructions, and verbalized an understanding of all discharge instructions.  Patient agrees with discharge plan, and is being discharged in stable medical condition.  Patient given transportation via wheelchair. 

## 2016-04-10 NOTE — Discharge Summary (Signed)
Physician Discharge Summary  DUSTEE BOTTENFIELD WNU:[id number] DOB: 1927/01/25 DOA: 04/08/2016  PCP: Bufford Spikes, DO  Admit date: 04/08/2016 Discharge date: 04/10/2016  Admitted From: ILF Disposition:  SNF  Recommendations for Outpatient Follow-up:  1. Follow up with PCP in 2 weeks to arrange open MRI if family still wants to have done  Discharge Condition: Stable  CODE STATUS:DNR  Brief/Interim Summary: Holly Grimes is a 82 y.o. female with advanced dementia, CHF, hypertension was brought to the ER after patient was found to be weak and doing poorly. As per patient's daughter last week patient was given an extra dose of Lasix for CHF. Patient also had complained of right upper quadrant pain 3 days ago, which resolved after patient had large bowel movement. Did not have any nausea vomiting or diarrhea. Since patient has been generally weak patient was brought to the ER. UA shows features concerning for UTI. Patient did not have any productive cough or chest pain. Patient has been admitted for further observation.   1. Generalized weakness probably from deconditioning and UTI - Finish course of ciprofloxacin. PT recommended SNF.  Family wants to pursue Short-term SNF rehab.  2. Right upper quadrant pain which has resolved at this time - since patients CT scan in November 2016 showed hepatic cyst patient's daughter is requesting MRI to further study on this which has been ordered.  Pt unable to do MRI because of not able to cooperate and severe claustrophobia.  Recommend outpatient OPEN MRI study ordered through PCP.   3. CHF chronic diastolic appears compensated last EF measured was in 2015 - 55% with grade 2 diastolic dysfunction - continue home Lasix. 4. Hypertension - continue Coreg. 5. Advanced dementia - on Namenda and Exelon patch.  Discharge Diagnoses:  Principal Problem:   Weakness Active Problems:   Parkinson's disease, Lewy body (HCC)   Chronic diastolic heart failure (HCC)   UTI  (lower urinary tract infection)   Abdominal discomfort   Acute cystitis without hematuria  Discharge Instructions  Discharge Instructions    Increase activity slowly    Complete by:  As directed             Medication List    STOP taking these medications        fluticasone 0.05 % cream  Commonly known as:  CUTIVATE     miconazole 2 % powder  Commonly known as:  LOTRIMIN AF     nystatin 100000 UNIT/GM Powd      TAKE these medications        acetaminophen 650 MG CR tablet  Commonly known as:  TYLENOL  Take 1,300 mg by mouth 2 (two) times daily.     albuterol 1.25 MG/3ML nebulizer solution  Commonly known as:  ACCUNEB  Take 3 mLs (1.25 mg total) by nebulization every 6 (six) hours as needed for wheezing.     ALIGN 4 MG Caps  Take 1 capsule by mouth daily.     BENEFIBER Powd  Take by mouth 2 (two) times daily.     CALCIUM CITRATE-VITAMIN D PO  Take by mouth 2 (two) times daily.     carvedilol 3.125 MG tablet  Commonly known as:  COREG  Take 1 tablet (3.125 mg total) by mouth at bedtime.     CENTRUM SILVER ULTRA WOMENS PO  Take 1 tablet by mouth every morning.     ciprofloxacin 500 MG tablet  Commonly known as:  CIPRO  Take 1 tablet (500 mg total) by  mouth 2 (two) times daily.     citalopram 20 MG tablet  Commonly known as:  CELEXA  TAKE 1 TABLET BY MOUTH ONCE A DAY.     clopidogrel 75 MG tablet  Commonly known as:  PLAVIX  TAKE 1 TABLET BY MOUTH ONCE A DAY TO HELP CIRCULATION.     COLACE 100 MG capsule  Generic drug:  docusate sodium  Take 100 mg by mouth at bedtime.     esomeprazole 40 MG capsule  Commonly known as:  NEXIUM  Take 1 capsule (40 mg total) by mouth daily at 12 noon.     EXELON 4.6 mg/24hr  Generic drug:  rivastigmine  APPLY 1 PATCH TO SKIN DAILY.REMOVE USED PATCH.     feeding supplement Liqd  Take 1 Container by mouth as needed.     furosemide 20 MG tablet  Commonly known as:  LASIX  TAKE 1 TAB BY MOUTH ONCE DAILY UNLESS  WEIGHT GAIN OF 3 LBS IN 1 DAY OR 5 LBS IN 1 WEEK,TAKE 2 TABS BY MOUTH AS NEEDED FOR SWELLING.     haloperidol 0.5 MG tablet  Commonly known as:  HALDOL  Take one tablet by mouth once daily as needed for agitation/psychosis     Melatonin 1 MG/4ML Liqd  Take 1 mL (0.25 mg total) by mouth at bedtime as needed (insomnia).     mirabegron ER 50 MG Tb24 tablet  Commonly known as:  MYRBETRIQ  Take one tablet by mouth once daily for bladder     MUCINEX MAXIMUM STRENGTH 1200 MG Tb12  Generic drug:  Guaifenesin  Take 1,200 mg by mouth daily. Reported on 11/06/2015     NAMENDA XR 28 MG Cp24 24 hr capsule  Generic drug:  memantine  TAKE 1 CAPSULE BY MOUTH EVERY DAY FOR MEMORY.     omega-3 acid ethyl esters 1 g capsule  Commonly known as:  LOVAZA  Take two capsules by mouth twice daily for cholesterol     Pramox-PE-Glycerin-Petrolatum 1-0.25-14.4-15 % Crea  Commonly known as:  PREPARATION H  Apply to hemorrhoids as needed for pain or itching     rosuvastatin 10 MG tablet  Commonly known as:  CRESTOR  TAKE (1) TABLET BY MOUTH AT BEDTIME.     vitamin C with rose hips 1000 MG tablet  Take 1,000 mg by mouth daily.     Vitamin D3 2000 units Tabs  Take 2,000 Units by mouth daily.           Follow-up Information    Follow up with REED, TIFFANY, DO. Schedule an appointment as soon as possible for a visit in 2 weeks.   Specialty:  Geriatric Medicine   Why:  Hospital Follow Up   Contact information:   [address] [place] [address] [phone]      Allergies  Allergen Reactions  . Cefepime Itching    Redness to forehead  . Cephalexin Nausea And Vomiting  . Sulfa Antibiotics Other (See Comments)    Daughter unsure of reaction, but believes it is severe  . Sulfites     unknown    Consultations:  *PT/OT   Procedures/Studies: Dg Chest 2 View  04/08/2016  CLINICAL DATA:  Altered mental status. Generalized weakness and cough. Difficulty breathing for 5 days. EXAM: CHEST   2 VIEW COMPARISON:  08/20/2015 FINDINGS: Cardiomediastinal contours are unchanged with tortuosity of the thoracic aorta. Chronic bronchial thickening. No evidence of pulmonary edema. No confluent airspace disease, pleural effusion or pneumothorax.  Calcified granuloma in both lung bases. No acute osseous abnormalities are seen. Degenerative change about both shoulders, left greater than right. IMPRESSION: Stable chronic bronchitic change. No acute abnormality seen radiographically. Electronically Signed   By: Rubye OaksMelanie  Ehinger M.D.   On: 04/08/2016 23:24    Subjective: Pt much more alert.  No complaints.  No distress.  Appears at baseline.   Discharge Exam: Filed Vitals:   04/09/16 2113 04/10/16 0608  BP: 123/60 133/78  Pulse: 78 69  Temp: 99.1 F (37.3 C) 98.9 F (37.2 C)  Resp: 18 18   Filed Vitals:   04/09/16 1057 04/09/16 1601 04/09/16 2113 04/10/16 0608  BP:  132/60 123/60 133/78  Pulse:  72 78 69  Temp:  98.6 F (37 C) 99.1 F (37.3 C) 98.9 F (37.2 C)  TempSrc:  Axillary Oral Oral  Resp:  18 18 18   Weight: 192 lb 7.4 oz (87.3 kg)     SpO2:  96% 98% 97%    General: Pt is alert, awake, not in acute distress Cardiovascular: RRR, S1/S2 +, no rubs, no gallops Respiratory: CTA bilaterally, no wheezing, no rhonchi Abdominal: Soft, NT, ND, bowel sounds + Extremities: no clubbing, no cyanosis   The results of significant diagnostics from this hospitalization (including imaging, microbiology, ancillary and laboratory) are listed below for reference.     Microbiology: Recent Results (from the past 240 hour(s))  Urine culture     Status: None   Collection Time: 04/09/16 12:01 AM  Result Value Ref Range Status   Specimen Description URINE, CATHETERIZED  Final   Special Requests NONE  Final   Culture NO GROWTH [facility]formed [facility]   Final   Report Status 04/10/2016 FINAL  Final     Labs: BNP (last 3 results)  Recent Labs  08/20/15 1709  BNP 75.2   Basic  Metabolic Panel:  Recent Labs Lab 04/08/16 2323 04/08/16 2332 04/09/16 0501 04/10/16 0456  NA 136 141 140 138  K 3.8 4.0 3.5 3.6  CL 102 104 107 106  CO2 28  --  27 26  GLUCOSE 89 86 80 101*  BUN 12 14 12 16   CREATININE 0.66 0.80 0.68 0.96  CALCIUM 8.5*  --  8.4* 8.4*  MG 2.1  --   --   --    Liver Function Tests:  Recent Labs Lab 04/08/16 2323 04/09/16 0501  AST 32 23  ALT 14 13*  ALKPHOS 42 42  BILITOT 1.5* 1.0  PROT 6.6 6.2*  ALBUMIN 3.4* 3.1*    Recent Labs Lab 04/08/16 2323  LIPASE 31   No results for input(s): AMMONIA in the last 168 hours. CBC:  Recent Labs Lab 04/08/16 2323 04/08/16 2332 04/09/16 0501 04/10/16 0456  WBC 5.4  --  5.7 5.9  NEUTROABS 2.8  --  3.0  --   HGB 12.0 11.9* 11.9* 11.3*  HCT 35.2* 35.0* 36.0 33.8*  MCV 94.4  --  98.1 97.7  PLT 195  --  184 179   Cardiac Enzymes: No results for input(s): CKTOTAL, CKMB, CKMBINDEX, TROPONINI in the last 168 hours. BNP: Invalid input(s): POCBNP CBG:  Recent Labs Lab 04/08/16 2228  GLUCAP 88   D-Dimer No results for input(s): DDIMER in the last 72 hours. Hgb A1c No results for input(s): HGBA1C in the last 72 hours. Lipid Profile No results for input(s): CHOL, HDL, LDLCALC, TRIG, CHOLHDL, LDLDIRECT in the last 72 hours. Thyroid function studies No results for input(s): TSH, T4TOTAL, T3FREE, THYROIDAB  in the last 72 hours.  Invalid input(s): FREET3 Anemia work up No results for input(s): VITAMINB12, FOLATE, FERRITIN, TIBC, IRON, RETICCTPCT in the last 72 hours. Urinalysis    Component Value Date/Time   COLORURINE YELLOW 04/09/2016 0001   APPEARANCEUR CLOUDY* 04/09/2016 0001   APPEARANCEUR Clear 06/06/2014 1722   LABSPEC 1.022 04/09/2016 0001   PHURINE 6.5 04/09/2016 0001   GLUCOSEU NEGATIVE 04/09/2016 0001   HGBUR NEGATIVE 04/09/2016 0001   BILIRUBINUR NEGATIVE 04/09/2016 0001   BILIRUBINUR negative 05/15/2015 0814   BILIRUBINUR Negative 06/06/2014 1722   KETONESUR  NEGATIVE 04/09/2016 0001   PROTEINUR NEGATIVE 04/09/2016 0001   PROTEINUR 30 05/15/2015 0814   PROTEINUR Negative 06/06/2014 1722   UROBILINOGEN 0.2 08/20/2015 1700   UROBILINOGEN 0.2 05/15/2015 0814   NITRITE NEGATIVE 04/09/2016 0001   NITRITE negative 05/15/2015 0814   NITRITE Negative 06/06/2014 1722   LEUKOCYTESUR LARGE* 04/09/2016 0001   LEUKOCYTESUR 2+* 06/06/2014 1722   Sepsis Labs Invalid input(s): PROCALCITONIN,  WBC,  LACTICIDVEN Microbiology Recent Results (from the past 240 hour(s))  Urine culture     Status: None   Collection Time: 04/09/16 12:01 AM  Result Value Ref Range Status   Specimen Description URINE, CATHETERIZED  Final   Special Requests NONE  Final   Culture NO GROWTH Performed at [facility]   Final   Report Status 04/10/2016 FINAL  Final   Time coordinating discharge: 37 minutes  SIGNED:  Standley Dakinslanford Johnson, MD  [facility] 04/10/2016, 11:33 AM Pager   If 7PM-7AM, please contact night-coverage [email or web address] Password [email or web address]

## 2016-04-10 NOTE — Discharge Instructions (Signed)
Urinary Tract Infection Urinary tract infections (UTIs) can develop anywhere along your urinary tract. Your urinary tract is your body's drainage system for removing wastes and extra water. Your urinary tract includes two kidneys, two ureters, a bladder, and a urethra. Your kidneys are a pair of bean-shaped organs. Each kidney is about the size of your fist. They are located below your ribs, one on each side of your spine. CAUSES Infections are caused by microbes, which are microscopic organisms, including fungi, viruses, and bacteria. These organisms are so small that they can only be seen through a microscope. Bacteria are the microbes that most commonly cause UTIs. SYMPTOMS  Symptoms of UTIs may vary by age and gender of the patient and by the location of the infection. Symptoms in young women typically include a frequent and intense urge to urinate and a painful, burning feeling in the bladder or urethra during urination. Older women and men are more likely to be tired, shaky, and weak and have muscle aches and abdominal pain. A fever may mean the infection is in your kidneys. Other symptoms of a kidney infection include pain in your back or sides below the ribs, nausea, and vomiting. DIAGNOSIS To diagnose a UTI, your caregiver will ask you about your symptoms. Your caregiver will also ask you to provide a urine sample. The urine sample will be tested for bacteria and white blood cells. White blood cells are made by your body to help fight infection. TREATMENT  Typically, UTIs can be treated with medication. Because most UTIs are caused by a bacterial infection, they usually can be treated with the use of antibiotics. The choice of antibiotic and length of treatment depend on your symptoms and the type of bacteria causing your infection. HOME CARE INSTRUCTIONS  If you were prescribed antibiotics, take them exactly as your caregiver instructs you. Finish the medication even if you feel better after  you have only taken some of the medication.  Drink enough water and fluids to keep your urine clear or pale yellow.  Avoid caffeine, tea, and carbonated beverages. They tend to irritate your bladder.  Empty your bladder often. Avoid holding urine for long periods of time.  Empty your bladder before and after sexual intercourse.  After a bowel movement, women should cleanse from front to back. Use each tissue only once. SEEK MEDICAL CARE IF:   You have back pain.  You develop a fever.  Your symptoms do not begin to resolve within 3 days. SEEK IMMEDIATE MEDICAL CARE IF:   You have severe back pain or lower abdominal pain.  You develop chills.  You have nausea or vomiting.  You have continued burning or discomfort with urination. MAKE SURE YOU:   Understand these instructions.  Will watch your condition.  Will get help right away if you are not doing well or get worse.   This information is not intended to replace advice given to you by your health care provider. Make sure you discuss any questions you have with your health care provider.   Document Released: 07/02/2005 Document Revised: 06/13/2015 Document Reviewed: 10/31/2011 Elsevier Interactive Patient Education 2016 Elsevier Inc. Weakness Weakness is a lack of strength. You may feel weak all over your body or just in one part of your body. Weakness can be serious. In some cases, you may need more medical tests. HOME CARE  Rest.  Eat a well-balanced diet.  Try to exercise every day.  Only take medicines as told by your doctor. GET  HELP RIGHT AWAY IF:   You cannot do your normal daily activities.  You cannot walk up and down stairs, or you feel very tired when you do so.  You have shortness of breath or chest pain.  You have trouble moving parts of your body.  You have weakness in only one body part or on only one side of the body.  You have a fever.  You have trouble speaking or swallowing.  You  cannot control when you pee (urinate) or poop (bowel movement).  You have black or bloody throw up (vomit) or poop.  Your weakness gets worse or spreads to other body parts.  You have new aches or pains. MAKE SURE YOU:   Understand these instructions.  Will watch your condition.  Will get help right away if you are not doing well or get worse.   This information is not intended to replace advice given to you by your health care provider. Make sure you discuss any questions you have with your health care provider.   Document Released: 09/04/2008 Document Revised: 03/23/2012 Document Reviewed: 11/21/2011 Elsevier Interactive Patient Education [employer]2016 Elsevier Inc.

## 2016-04-10 NOTE — Progress Notes (Signed)
Date: April 11 2016/Rhonda Earlene Plateravis, RN, BSN, CCM   [phone]/orders for [facility] and face to face with dc summary faxed to [facility].

## 2016-04-10 NOTE — NC FL2 (Signed)
[place] MEDICAID FL2 LEVEL OF CARE SCREENING TOOL     IDENTIFICATION  Patient Name: Holly Grimes Birthdate: 05/20/1927 Sex: female Admission Date (Current Location): 04/08/2016  [facility] [place]   Facility and Address:  [facility],  [address], [place] [id number]      Provider Number: [id number]  Attending Physician Name and Address:  Cleora Fleetlanford L Johnson, MD  Relative Name and Phone Number:  Cala BradfordKimberly [phone]    Current Level of Care: Hospital Recommended Level of Care: Skilled Nursing Facility Prior Approval Number:    Date Approved/Denied:   PASRR Number: [id number]  Discharge Plan: SNF    Current Diagnoses: Patient Active Problem List   Diagnosis Date Noted  . UTI (lower urinary tract infection) 04/09/2016  . Abdominal discomfort 04/09/2016  . Weakness 04/09/2016  . Acute cystitis without hematuria   . UTI (urinary tract infection) 11/06/2015  . Hypotension 08/20/2015  . Acute diastolic heart failure (HCC) 06/10/2014  . Chronic diastolic heart failure (HCC) 06/10/2014  . Acute torticollis 06/09/2014  . Swollen joint 06/08/2014  . Generalized weakness 06/08/2014  . Confusion 06/08/2014  . Altered mental state 06/08/2014  . Obesity (BMI 30-39.9) 04/24/2014  . Slow transit constipation 04/24/2014  . History of fall 04/24/2014  . Bipolar I disorder, most recent episode (or current) manic (HCC) 04/24/2014  . Urge incontinence 01/19/2014  . Hip joint replacement status 01/19/2014  . Candidal skin infection 11/05/2013  . Pneumonitis 11/05/2013  . GERD (gastroesophageal reflux disease) 10/02/2013  . Constipation 10/02/2013  . Hyperlipemia 10/02/2013  . Depression 10/02/2013  . Right hip pain 10/02/2013  . Dementia 09/26/2013  . Hip fracture (HCC) 09/23/2013  . Hip fracture requiring operative repair (HCC) 09/23/2013  . Parkinson's disease, Lewy body (HCC)   . Hypertension   . Orthostatic hypotension   .  Urinary frequency   . Personal history of fall   . Osteoarthrosis, unspecified whether generalized or localized, unspecified site     Orientation RESPIRATION BLADDER Height & Weight     Self  O2 (2L) Incontinent Weight: 192 lb 7.4 oz (87.3 kg) Height:     BEHAVIORAL SYMPTOMS/MOOD NEUROLOGICAL BOWEL NUTRITION STATUS      Continent Diet  AMBULATORY STATUS COMMUNICATION OF NEEDS Skin   Limited Assist Verbally Normal                       Personal Care Assistance Level of Assistance  Bathing, Feeding, Dressing Bathing Assistance: Limited assistance Feeding assistance: Limited assistance Dressing Assistance: Limited assistance     Functional Limitations Info  Sight, Hearing, Speech Sight Info: Adequate Hearing Info: Adequate Speech Info: Adequate    SPECIAL CARE FACTORS FREQUENCY  PT (By licensed PT), OT (By licensed OT)     PT Frequency: 5 OT Frequency: 5            Contractures Contractures Info: Not present    Additional Factors Info  Allergies Code Status Info: DNR Allergies Info: Cefepime, Cephalexin, Sulfa Antibiotics, Sulfites           Current Medications (04/10/2016):  This is the current hospital active medication list Current Facility-Administered Medications  Medication Dose Route Frequency Provider Last Rate Last Dose  . acetaminophen (TYLENOL) tablet 650 mg  650 mg Oral Q6H PRN Eduard ClosArshad N Kakrakandy, MD       Or  . acetaminophen (TYLENOL) suppository 650 mg  650 mg Rectal Q6H PRN Eduard ClosArshad N Kakrakandy, MD      .  acidophilus (RISAQUAD) capsule 1 capsule  1 capsule Oral Daily Eduard ClosArshad N Kakrakandy, MD   1 capsule at 04/10/16 0931  . albuterol (PROVENTIL) (2.5 MG/3ML) 0.083% nebulizer solution 1.5 mL  1.5 mL Nebulization Q6H PRN Eduard ClosArshad N Kakrakandy, MD      . carvedilol (COREG) tablet 3.125 mg  3.125 mg Oral QHS Eduard ClosArshad N Kakrakandy, MD   3.125 mg at 04/09/16 2122  . cholecalciferol (VITAMIN D) tablet 2,000 Units  2,000 Units Oral Daily Eduard ClosArshad N  Kakrakandy, MD   2,000 Units at 04/10/16 [phone]  . ciprofloxacin (CIPRO) IVPB 400 mg  400 mg Intravenous Q12H Lorenza EvangelistElizabeth R Green, RPH   400 mg at 04/10/16 0932  . citalopram (CELEXA) tablet 20 mg  20 mg Oral Daily Eduard ClosArshad N Kakrakandy, MD   20 mg at 04/10/16 0930  . clopidogrel (PLAVIX) tablet 75 mg  75 mg Oral Daily Eduard ClosArshad N Kakrakandy, MD   75 mg at 04/10/16 0930  . docusate sodium (COLACE) capsule 100 mg  100 mg Oral QHS Eduard ClosArshad N Kakrakandy, MD   100 mg at 04/09/16 2122  . enoxaparin (LOVENOX) injection 40 mg  40 mg Subcutaneous Q24H Eduard ClosArshad N Kakrakandy, MD   40 mg at 04/10/16 0931  . fluticasone (CUTIVATE) 0.05 % cream 1 application  1 application Topical BID Eduard ClosArshad N Kakrakandy, MD   1 application at 04/09/16 1000  . furosemide (LASIX) tablet 20 mg  20 mg Oral Daily Eduard ClosArshad N Kakrakandy, MD   20 mg at 04/10/16 0930  . guaiFENesin (MUCINEX) 12 hr tablet 1,200 mg  1,200 mg Oral Daily Eduard ClosArshad N Kakrakandy, MD   1,200 mg at 04/10/16 0930  . haloperidol (HALDOL) tablet 0.5 mg  0.5 mg Oral Daily PRN Eduard ClosArshad N Kakrakandy, MD      . memantine (NAMENDA XR) 24 hr capsule 28 mg  28 mg Oral Daily Eduard ClosArshad N Kakrakandy, MD   28 mg at 04/10/16 0930  . mirabegron ER (MYRBETRIQ) tablet 50 mg  50 mg Oral Daily Eduard ClosArshad N Kakrakandy, MD   50 mg at 04/10/16 0931  . multivitamin with minerals tablet   Oral Scharlene GlossBH-q7a Arshad N Kakrakandy, MD   1 tablet at 04/10/16 0930  . omega-3 acid ethyl esters (LOVAZA) capsule 2 g  2 g Oral Daily Eduard ClosArshad N Kakrakandy, MD   2 g at 04/10/16 0930  . ondansetron (ZOFRAN) tablet 4 mg  4 mg Oral Q6H PRN Eduard ClosArshad N Kakrakandy, MD       Or  . ondansetron [facility](ZOFRAN) injection 4 mg  4 mg Intravenous Q6H PRN Eduard ClosArshad N Kakrakandy, MD      . pantoprazole (PROTONIX) EC tablet 40 mg  40 mg Oral Daily Eduard ClosArshad N Kakrakandy, MD   40 mg at 04/10/16 0931  . psyllium (HYDROCIL/METAMUCIL) packet   Oral BID Eduard ClosArshad N Kakrakandy, MD      . rivastigmine (EXELON) 4.6 mg/24hr 4.6 mg  4.6 mg Transdermal Daily Eduard ClosArshad N  Kakrakandy, MD   4.6 mg at 04/10/16 0931  . rosuvastatin (CRESTOR) tablet 10 mg  10 mg Oral q1800 Eduard ClosArshad N Kakrakandy, MD   10 mg at 04/09/16 1825  . vitamin C (ASCORBIC ACID) tablet 1,000 mg  1,000 mg Oral Daily Eduard ClosArshad N Kakrakandy, MD   1,000 mg at 04/10/16 [id number]     Discharge Medications: Please see discharge summary for a list of discharge medications.  Relevant Imaging Results:  Relevant Lab Results:   Additional Information EX#[id number]  Clearance CootsNicole A Austen Oyster, LCSW

## 2016-04-10 NOTE — Progress Notes (Signed)
PHARMACY NOTE -  ciprofloxacin  Pharmacy has been assisting with dosing of ciprofloxacin for UTI. Dosage remains stable at 400 mg IV q12h and need for further dosage adjustment appears unlikely at present.    Will sign off at this time.  Please reconsult if a change in clinical status warrants re-evaluation of dosage.  Dorna LeitzAnh Onetta Spainhower, PharmD, BCPS 04/10/2016 8:17 AM

## 2016-04-10 NOTE — Progress Notes (Addendum)
Patient will not meet the three night qualifying stay for medicare insurance to cover the cost for Skilled Nursing Facility Patient daughter is requesting Home Health PT as option. LCSWA informed RNCM.  LCSWA provided family with SNF list for private pay option.  LCSWA is signing off at this time.   Holly BarrackNicole Alekzander Grimes, Theresia MajorsLCSWA, MSW Clinical Social Worker 5E and Psychiatric Service Line [phone] 04/10/2016  11:43 AM

## 2016-04-10 NOTE — Progress Notes (Signed)
OT Cancellation Note  Patient Details Name: Holly Grimes MRN: [id number] DOB: December 12, 1926   Cancelled Treatment:    Reason Eval/Treat Not Completed: Other (comment). Attempted to see pt this am, but she does not want to do anything.  Visitor states she did not sleep well and this is not a good time. Will try to check back later.  Khaalid Lefkowitz 04/10/2016, 8:18 AM  Holly Grimes, OTR/L [phone] 04/10/2016

## 2016-04-10 NOTE — Progress Notes (Signed)
Date: April 11 2016/Rhonda Stark JockDavis, RN, BSN, [place]   [phone] tct-daughter-would like to continue using [facility]/tct-wanda will restart pt care will fax order and dc summary to [facility].

## 2016-04-11 DIAGNOSIS — F0281 Dementia in other diseases classified elsewhere with behavioral disturbance: Secondary | ICD-10-CM | POA: Diagnosis not present

## 2016-04-11 DIAGNOSIS — I110 Hypertensive heart disease with heart failure: Secondary | ICD-10-CM | POA: Diagnosis not present

## 2016-04-11 DIAGNOSIS — M6281 Muscle weakness (generalized): Secondary | ICD-10-CM | POA: Diagnosis not present

## 2016-04-11 DIAGNOSIS — G20 Parkinson's disease: Secondary | ICD-10-CM | POA: Diagnosis not present

## 2016-04-11 DIAGNOSIS — I5032 Chronic diastolic (congestive) heart failure: Secondary | ICD-10-CM | POA: Diagnosis not present

## 2016-04-11 DIAGNOSIS — R260 Ataxic gait: Secondary | ICD-10-CM | POA: Diagnosis not present

## 2016-04-14 LAB — CULTURE, BLOOD (ROUTINE X 2)
CULTURE: NO GROWTH
Culture: NO GROWTH

## 2016-04-15 ENCOUNTER — Encounter: Payer: Self-pay | Admitting: *Deleted

## 2016-04-15 ENCOUNTER — Telehealth: Payer: Self-pay | Admitting: *Deleted

## 2016-04-15 DIAGNOSIS — R260 Ataxic gait: Secondary | ICD-10-CM | POA: Diagnosis not present

## 2016-04-15 DIAGNOSIS — I5032 Chronic diastolic (congestive) heart failure: Secondary | ICD-10-CM | POA: Diagnosis not present

## 2016-04-15 DIAGNOSIS — F0281 Dementia in other diseases classified elsewhere with behavioral disturbance: Secondary | ICD-10-CM | POA: Diagnosis not present

## 2016-04-15 DIAGNOSIS — F03918 Unspecified dementia, unspecified severity, with other behavioral disturbance: Secondary | ICD-10-CM

## 2016-04-15 DIAGNOSIS — F0391 Unspecified dementia with behavioral disturbance: Secondary | ICD-10-CM

## 2016-04-15 DIAGNOSIS — I110 Hypertensive heart disease with heart failure: Secondary | ICD-10-CM | POA: Diagnosis not present

## 2016-04-15 DIAGNOSIS — I10 Essential (primary) hypertension: Secondary | ICD-10-CM

## 2016-04-15 DIAGNOSIS — M6281 Muscle weakness (generalized): Secondary | ICD-10-CM | POA: Diagnosis not present

## 2016-04-15 DIAGNOSIS — G20 Parkinson's disease: Secondary | ICD-10-CM | POA: Diagnosis not present

## 2016-04-15 MED ORDER — AMBULATORY NON FORMULARY MEDICATION
Status: DC
Start: 2016-04-15 — End: 2016-04-24

## 2016-04-15 NOTE — Telephone Encounter (Signed)
Patient daughter, Holly BattenKim called and stated that patient fell last night. Patient has a lot of weakness getting off of toilet. Patient is having swelling in right knee and Left Foot/ankle and would like an order for a mobile x-ray. Daughter states it is too hard to get patient out. Patient uses [facility]. BP has been running low at 85/54 this morning 82/54 this afternoon with pulse of 90. Would also like to know if she would qualify for palliative care. Please Advise.

## 2016-04-15 NOTE — Telephone Encounter (Signed)
Daughter is giving her mother a tablet once at bedtime once daily per Dr. Renato Gailseed. Can't cut in half. Please Advise.  Referral placed for Palliative Care and order faxed to [facility] Fax: [phone]

## 2016-04-15 NOTE — Telephone Encounter (Signed)
Yes due to dementia she would qualify for palliative care. Is patient at home? Okay for order for xray of the right knee and left ankle  Have daughter cut coreg tablet in half and give 1/2 tablet twice daily due to low blood pressure

## 2016-04-16 ENCOUNTER — Emergency Department (HOSPITAL_COMMUNITY)
Admission: EM | Admit: 2016-04-16 | Discharge: 2016-04-16 | Disposition: A | Discharge status: Home / Self Care | Payer: Medicare Other | Attending: Emergency Medicine | Admitting: Emergency Medicine

## 2016-04-16 ENCOUNTER — Encounter (HOSPITAL_COMMUNITY): Payer: Self-pay | Admitting: Neurology

## 2016-04-16 ENCOUNTER — Emergency Department (HOSPITAL_COMMUNITY): Payer: Medicare Other

## 2016-04-16 DIAGNOSIS — M79605 Pain in left leg: Secondary | ICD-10-CM | POA: Diagnosis not present

## 2016-04-16 DIAGNOSIS — R0789 Other chest pain: Secondary | ICD-10-CM

## 2016-04-16 DIAGNOSIS — Z96641 Presence of right artificial hip joint: Secondary | ICD-10-CM | POA: Diagnosis not present

## 2016-04-16 DIAGNOSIS — Z8673 Personal history of transient ischemic attack (TIA), and cerebral infarction without residual deficits: Secondary | ICD-10-CM | POA: Insufficient documentation

## 2016-04-16 DIAGNOSIS — M79672 Pain in left foot: Secondary | ICD-10-CM | POA: Diagnosis not present

## 2016-04-16 DIAGNOSIS — G20 Parkinson's disease: Secondary | ICD-10-CM | POA: Diagnosis not present

## 2016-04-16 DIAGNOSIS — F0281 Dementia in other diseases classified elsewhere with behavioral disturbance: Secondary | ICD-10-CM | POA: Insufficient documentation

## 2016-04-16 DIAGNOSIS — Z96653 Presence of artificial knee joint, bilateral: Secondary | ICD-10-CM | POA: Insufficient documentation

## 2016-04-16 DIAGNOSIS — R079 Chest pain, unspecified: Secondary | ICD-10-CM | POA: Diagnosis not present

## 2016-04-16 DIAGNOSIS — G309 Alzheimer's disease, unspecified: Secondary | ICD-10-CM | POA: Insufficient documentation

## 2016-04-16 DIAGNOSIS — T148 Other injury of unspecified body region: Secondary | ICD-10-CM | POA: Diagnosis not present

## 2016-04-16 DIAGNOSIS — M25572 Pain in left ankle and joints of left foot: Secondary | ICD-10-CM | POA: Diagnosis not present

## 2016-04-16 DIAGNOSIS — I10 Essential (primary) hypertension: Secondary | ICD-10-CM | POA: Insufficient documentation

## 2016-04-16 DIAGNOSIS — M79662 Pain in left lower leg: Secondary | ICD-10-CM | POA: Diagnosis not present

## 2016-04-16 DIAGNOSIS — M25561 Pain in right knee: Secondary | ICD-10-CM | POA: Diagnosis not present

## 2016-04-16 LAB — COMPREHENSIVE METABOLIC PANEL
ALT: 12 U/L — AB (ref 14–54)
AST: 26 U/L (ref 15–41)
Albumin: 3 g/dL — ABNORMAL LOW (ref 3.5–5.0)
Alkaline Phosphatase: 43 U/L (ref 38–126)
Anion gap: 8 (ref 5–15)
BUN: 10 mg/dL (ref 6–20)
CHLORIDE: 103 mmol/L (ref 101–111)
CO2: 28 mmol/L (ref 22–32)
CREATININE: 0.79 mg/dL (ref 0.44–1.00)
Calcium: 9.1 mg/dL (ref 8.9–10.3)
GFR calc non Af Amer: >60 mL/min (ref >60)
Glucose, Bld: 95 mg/dL (ref 65–99)
POTASSIUM: 3.5 mmol/L (ref 3.5–5.1)
SODIUM: 139 mmol/L (ref 135–145)
Total Bilirubin: 0.6 mg/dL (ref 0.3–1.2)
Total Protein: 6 g/dL — ABNORMAL LOW (ref 6.5–8.1)

## 2016-04-16 LAB — CBC
HCT: 35.9 % — ABNORMAL LOW (ref 36.0–46.0)
Hemoglobin: 11.6 g/dL — ABNORMAL LOW (ref 12.0–15.0)
MCH: 32 pg (ref 26.0–34.0)
MCHC: 32.3 g/dL (ref 30.0–36.0)
MCV: 99.2 fL (ref 78.0–100.0)
PLATELETS: 205 10*3/uL (ref 150–400)
RBC: 3.62 MIL/uL — AB (ref 3.87–5.11)
RDW: 12.8 % (ref 11.5–15.5)
WBC: 4.2 10*3/uL (ref 4.0–10.5)

## 2016-04-16 LAB — PROTIME-INR
INR: 1.05 (ref 0.00–1.49)
PROTHROMBIN TIME: 13.9 s (ref 11.6–15.2)

## 2016-04-16 LAB — TROPONIN I

## 2016-04-16 LAB — MAGNESIUM: MAGNESIUM: 1.8 mg/dL (ref 1.7–2.4)

## 2016-04-16 MED ORDER — ASPIRIN 81 MG PO CHEW
324.0000 mg | CHEWABLE_TABLET | Freq: Once | ORAL | Status: DC
Start: 2016-04-16 — End: 2016-04-16

## 2016-04-16 NOTE — ED Notes (Signed)
Patient undressed, in gown, on monitor, continuous pulse oximetry and blood pressure cuff 

## 2016-04-16 NOTE — Telephone Encounter (Signed)
Noted  

## 2016-04-16 NOTE — ED Provider Notes (Signed)
CSN: [id number]     Arrival date & time 04/16/16  0840 History   First MD Initiated Contact with Patient 04/16/16 [phone]     Chief Complaint  Patient presents with  . Chest Pain   HPI  Patient presents with reported complaint of chest pain. Patient has dementia, level V caveat. Patient lives in an assisted living facility. Per report the patient quickly has complaints of diffuse pain. Today the patient complained to someone of anterior chest pain. Currently she denies this, or pain anywhere else. Reportedly the patient also complained of pain in her right knee, left ankle earlier this week, unclear if there was a fall. Patient was scheduled for x-ray evaluation of these areas today. EMS reports the patient was awake and alert, pleasant in route, denying any complaints.   Past Medical History  Diagnosis Date  . Hypertension   . High cholesterol   . Mitral valve prolapse   . Dementia   . Parkinson's disease, Lewy body (HCC)   . Unspecified constipation   . Onychia and paronychia of toe   . Spasm of muscle   . Abnormality of gait   . Orthostatic hypotension   . Major depressive disorder, single episode, unspecified (HCC)   . Diaphragmatic hernia without mention of obstruction or gangrene   . Alzheimer's disease   . Osteoarthrosis, unspecified whether generalized or localized, unspecified site   . Osteoporosis, unspecified   . Unspecified constipation   . Sprain of ribs   . Delirium due to conditions classified elsewhere   . Diarrhea   . Edema   . Pain in joint, pelvic region and thigh   . Other malaise and fatigue   . Urinary frequency [id number]  . Personal history of fall   . Female stress incontinence   . Other and unspecified hyperlipidemia   . Obesity, unspecified   . Osteoarthrosis, unspecified whether generalized or localized, unspecified site   . Pain in joint, shoulder region   . Transient ischemic attack (TIA), and cerebral infarction without residual deficits     . Risk for falls    Past Surgical History  Procedure Laterality Date  . Joint replacement    . Tonsillectomy    . Ankle surgery Left 1998  . Total knee arthroplasty Right 2002  . Total knee arthroplasty Left 2005  . Hip arthroplasty Right 09/24/2013    Procedure: ARTHROPLASTY BIPOLAR HIP;  Surgeon: Loanne DrillingFrank V Aluisio, MD;  Location: [facility];  Service: Orthopedics;  Laterality: Right;   Family History  Problem Relation Age of Onset  . Adopted: Yes  . Hypertension Other    Social History  Substance Use Topics  . Smoking status: Never Smoker   . Smokeless tobacco: Never Used  . Alcohol Use: No   OB History    No data available     Review of Systems  Unable to perform ROS: Dementia      Allergies  Cefepime; Cephalexin; Sulfa antibiotics; and Sulfites  Home Medications   Prior to Admission medications   Medication Sig Start Date End Date Taking? Authorizing Provider  acetaminophen (TYLENOL) 650 MG CR tablet Take 1,300 mg by mouth 2 (two) times daily.     Historical Provider, MD  albuterol (ACCUNEB) 1.25 MG/3ML nebulizer solution Take 3 mLs (1.25 mg total) by nebulization every 6 (six) hours as needed for wheezing. 01/08/15   Tiffany L Reed, DO  AMBULATORY NON FORMULARY MEDICATION Xray of Right Knee, Left Foot, Left Ankle Dx: Hx of Fall E888.9  and swelling R60.9 04/15/16   Sharon Seller, NP  Ascorbic Acid (VITAMIN C WITH ROSE HIPS) 1000 MG tablet Take 1,000 mg by mouth daily.      Historical Provider, MD  CALCIUM CITRATE-VITAMIN D PO Take by mouth 2 (two) times daily.     Historical Provider, MD  Cholecalciferol (VITAMIN D3) 2000 UNITS TABS Take 2,000 Units by mouth daily. 01/08/15   Tiffany L Reed, DO  citalopram (CELEXA) 20 MG tablet TAKE 1 TABLET BY MOUTH ONCE A DAY. 12/31/15   Tiffany L Reed, DO  clopidogrel (PLAVIX) 75 MG tablet TAKE 1 TABLET BY MOUTH ONCE A DAY TO HELP CIRCULATION. 12/31/15   Tiffany L Reed, DO  docusate sodium (COLACE) 100 MG capsule Take 100 mg by mouth  at bedtime.    Historical Provider, MD  esomeprazole (NEXIUM) 40 MG capsule Take 1 capsule (40 mg total) by mouth daily at 12 noon. 03/17/16   Tiffany L Reed, DO  EXELON 4.6 MG/24HR APPLY 1 PATCH TO SKIN DAILY.REMOVE USED PATCH. 12/31/15   Tiffany L Reed, DO  feeding supplement (BOOST HIGH PROTEIN) LIQD Take 1 Container by mouth as needed.     Historical Provider, MD  furosemide (LASIX) 20 MG tablet TAKE 1 TAB BY MOUTH ONCE DAILY UNLESS WEIGHT GAIN OF 3 LBS IN 1 DAY OR 5 LBS IN 1 WEEK,TAKE 2 TABS BY MOUTH AS NEEDED FOR SWELLING. 12/31/15   Tiffany L Reed, DO  Guaifenesin (MUCINEX MAXIMUM STRENGTH) 1200 MG TB12 Take 1,200 mg by mouth daily. Reported on 11/06/2015    Historical Provider, MD  haloperidol (HALDOL) 0.5 MG tablet Take one tablet by mouth once daily as needed for agitation/psychosis 01/08/15   Kermit Balo, DO  Melatonin 1 MG/4ML LIQD Take 1 mL (0.25 mg total) by mouth at bedtime as needed (insomnia). 02/01/15   Tiffany L Reed, DO  mirabegron ER (MYRBETRIQ) 50 MG TB24 tablet Take one tablet by mouth once daily for bladder 02/19/16   Tiffany L Reed, DO  Multiple Vitamins-Minerals (CENTRUM SILVER ULTRA WOMENS PO) Take 1 tablet by mouth every morning.      Historical Provider, MD  NAMENDA XR 28 MG CP24 24 hr capsule TAKE 1 CAPSULE BY MOUTH EVERY DAY FOR MEMORY. 12/31/15   Tiffany L Reed, DO  omega-3 acid ethyl esters (LOVAZA) 1 G capsule Take two capsules by mouth twice daily for cholesterol 05/30/15   Tiffany L Reed, DO  Pramox-PE-Glycerin-Petrolatum (PREPARATION H) 1-0.25-14.4-15 % CREA Apply to hemorrhoids as needed for pain or itching 02/01/15   Tiffany L Reed, DO  Probiotic Product (ALIGN) 4 MG CAPS Take 1 capsule by mouth daily. 01/08/15   Tiffany L Reed, DO  rosuvastatin (CRESTOR) 10 MG tablet TAKE (1) TABLET BY MOUTH AT BEDTIME. 12/31/15   Tiffany L Reed, DO  Wheat Dextrin (BENEFIBER) POWD Take by mouth 2 (two) times daily.     Historical Provider, MD   BP 143/88 mmHg  Pulse 67  Temp(Src) 98.3  F (36.8 C) (Oral)  Resp 18  SpO2 96%  LMP 08/22/2011 Physical Exam  Constitutional: She appears well-developed and well-nourished. No distress.  HENT:  Head: Normocephalic and atraumatic.  Eyes: Conjunctivae and EOM are normal.  Cardiovascular: Normal rate and regular rhythm.   Pulmonary/Chest: Effort normal and breath sounds normal. No stridor. No respiratory distress.  Abdominal: She exhibits no distension.  Musculoskeletal: She exhibits no edema.       Left hip: Normal.       Left knee: Normal.  Right ankle: Normal.       Legs:      Feet:  Neurological: She is alert. She displays no tremor. No cranial nerve deficit. She exhibits normal muscle tone. She displays no seizure activity.  Skin: Skin is warm and dry.  Psychiatric: She has a normal mood and affect. Her speech is delayed. Cognition and memory are impaired.  Nursing note and vitals reviewed.   ED Course  Procedures (including critical care time)  EMS rhythm strip sinus rhythm, 66, T-wave changes, nonspecific, otherwise unremarkable   Labs Review Labs Reviewed  CBC - Abnormal; Notable for the following:    RBC 3.62 (*)    Hemoglobin 11.6 (*)    HCT 35.9 (*)    All other components within normal limits  COMPREHENSIVE METABOLIC PANEL - Abnormal; Notable for the following:    Total Protein 6.0 (*)    Albumin 3.0 (*)    ALT 12 (*)    All other components within normal limits  MAGNESIUM  PROTIME-INR  TROPONIN I  TROPONIN I    Imaging Review Dg Chest 2 View  04/16/2016  CLINICAL DATA:  Chest pain EXAM: CHEST  2 VIEW COMPARISON:  04/08/2016 FINDINGS: Cardiac shadow is mildly enlarged but stable. The thoracic aorta is tortuous with mild calcifications. The lungs are well aerated bilaterally. A small calcified granuloma is noted in the right lung base. No acute bony abnormality is seen. IMPRESSION: No acute abnormality noted. Prior granulomatous disease. Aortic atherosclerosis. Electronically Signed   By:  Alcide Clever M.D.   On: 04/16/2016 09:59   Dg Knee 2 Views Right  04/16/2016  CLINICAL DATA:  Right knee pain, no known injury, initial encounter EXAM: RIGHT KNEE - 1-2 VIEW COMPARISON:  None. FINDINGS: Right knee replacement is seen. No signs of loosening are noted. No acute fracture or dislocation is seen. No joint effusion is noted. IMPRESSION: No acute abnormality noted. Electronically Signed   By: Alcide Clever M.D.   On: 04/16/2016 10:00   Dg Ankle 2 Views Left  04/16/2016  CLINICAL DATA:  Left ankle pain, no known injury, initial encounter EXAM: LEFT ANKLE - 2 VIEW COMPARISON:  None. FINDINGS: Fixation sideplate is noted along the distal fibula with multiple fixation screws. No acute fracture or dislocation is noted. No soft tissue abnormality is seen. Calcaneal spurs are noted. Irregularity of the distal talus is seen consistent with prior injury. IMPRESSION: No acute abnormality noted. Electronically Signed   By: Alcide Clever M.D.   On: 04/16/2016 10:03   I have personally reviewed and evaluated these images and lab results as part of my medical decision-making.   EKG Interpretation   Date/Time:  Wednesday April 16 2016 08:46:38 EDT Ventricular Rate:  74 PR Interval:    QRS Duration: 89 QT Interval:  436 QTC Calculation: 484 R Axis:   4 Text Interpretation:  Sinus rhythm Atrial premature complex Low voltage,  precordial leads Minimal ST depression, lateral leads Abnormal ekg  Confirmed by Gerhard Munch  MD ([id number]) on 04/16/2016 9:03:26 AM      11:59 AM Following 2 negative troponin, I reevaluated the patient. No ongoing complaints. She, her daughter and I discussed all findings. We discussed need for primary care follow-up.   MDM   Final diagnoses:  Atypical chest pain  Pain of left lower extremity   Elderly female presents with chest pain. Patient has dementia, limiting history of present illness. However, here the patient is awake and alert, in no distress, with  no  further complaints of chest pain. Her evaluation here is reassuring, with low evidence for acute new pathology, including 2 negative troponins ischemic EKG. Patient also had several areas of musculoskeletal pain, no evidence for fracture. Patient discharged in stable condition with primary care follow-up.   Gerhard Munch, MD 04/16/16 1159

## 2016-04-16 NOTE — ED Notes (Signed)
Per ems- [place] independent [place]. Her daughters were visiting today and she c/o of CP. Fire dept reports irregular pulse initially. EMS arrived with reports of SR HR, given 324 aspirin. Denying CP. A couple of days ago was transitioning from wheelchair to chair and got her legs tangled. C/o right knee pan and left ankle pain, was scheduled for mobile xray today. BP 170/100, HR 70 SR, CBG 129, 97%. Pt has dementia, has DNR.

## 2016-04-16 NOTE — ED Notes (Signed)
[facility]

## 2016-04-16 NOTE — Discharge Instructions (Signed)
As discussed, your evaluation today has been largely reassuring.  But, it is important that you monitor your condition carefully, and do not hesitate to return to the ED if you develop new, or concerning changes in your condition. ? ?Otherwise, please follow-up with your physician for appropriate ongoing care. ? ?

## 2016-04-16 NOTE — Telephone Encounter (Signed)
Patient daughter, Selena BattenKim notified and they are at the ER now with BP going high at 183/94 and patient complaints of Chest Pains. Daughter had called EMS this morning.

## 2016-04-16 NOTE — Telephone Encounter (Signed)
Okay lets stop it for now, cont to monitor HR and BP Also may need to cut lasix in half if BP still low

## 2016-04-17 DIAGNOSIS — R260 Ataxic gait: Secondary | ICD-10-CM | POA: Diagnosis not present

## 2016-04-17 DIAGNOSIS — I110 Hypertensive heart disease with heart failure: Secondary | ICD-10-CM | POA: Diagnosis not present

## 2016-04-17 DIAGNOSIS — I5032 Chronic diastolic (congestive) heart failure: Secondary | ICD-10-CM | POA: Diagnosis not present

## 2016-04-17 DIAGNOSIS — G20 Parkinson's disease: Secondary | ICD-10-CM | POA: Diagnosis not present

## 2016-04-17 DIAGNOSIS — F0281 Dementia in other diseases classified elsewhere with behavioral disturbance: Secondary | ICD-10-CM | POA: Diagnosis not present

## 2016-04-17 DIAGNOSIS — M6281 Muscle weakness (generalized): Secondary | ICD-10-CM | POA: Diagnosis not present

## 2016-04-21 DIAGNOSIS — I5032 Chronic diastolic (congestive) heart failure: Secondary | ICD-10-CM | POA: Diagnosis not present

## 2016-04-21 DIAGNOSIS — R260 Ataxic gait: Secondary | ICD-10-CM | POA: Diagnosis not present

## 2016-04-21 DIAGNOSIS — F0281 Dementia in other diseases classified elsewhere with behavioral disturbance: Secondary | ICD-10-CM | POA: Diagnosis not present

## 2016-04-21 DIAGNOSIS — I110 Hypertensive heart disease with heart failure: Secondary | ICD-10-CM | POA: Diagnosis not present

## 2016-04-21 DIAGNOSIS — G20 Parkinson's disease: Secondary | ICD-10-CM | POA: Diagnosis not present

## 2016-04-21 DIAGNOSIS — M6281 Muscle weakness (generalized): Secondary | ICD-10-CM | POA: Diagnosis not present

## 2016-04-22 DIAGNOSIS — F0281 Dementia in other diseases classified elsewhere with behavioral disturbance: Secondary | ICD-10-CM | POA: Diagnosis not present

## 2016-04-22 DIAGNOSIS — I110 Hypertensive heart disease with heart failure: Secondary | ICD-10-CM | POA: Diagnosis not present

## 2016-04-22 DIAGNOSIS — R260 Ataxic gait: Secondary | ICD-10-CM | POA: Diagnosis not present

## 2016-04-22 DIAGNOSIS — G20 Parkinson's disease: Secondary | ICD-10-CM | POA: Diagnosis not present

## 2016-04-22 DIAGNOSIS — M6281 Muscle weakness (generalized): Secondary | ICD-10-CM | POA: Diagnosis not present

## 2016-04-22 DIAGNOSIS — I5032 Chronic diastolic (congestive) heart failure: Secondary | ICD-10-CM | POA: Diagnosis not present

## 2016-04-23 DIAGNOSIS — R260 Ataxic gait: Secondary | ICD-10-CM | POA: Diagnosis not present

## 2016-04-23 DIAGNOSIS — M6281 Muscle weakness (generalized): Secondary | ICD-10-CM | POA: Diagnosis not present

## 2016-04-23 DIAGNOSIS — I5032 Chronic diastolic (congestive) heart failure: Secondary | ICD-10-CM | POA: Diagnosis not present

## 2016-04-23 DIAGNOSIS — G20 Parkinson's disease: Secondary | ICD-10-CM | POA: Diagnosis not present

## 2016-04-23 DIAGNOSIS — F0281 Dementia in other diseases classified elsewhere with behavioral disturbance: Secondary | ICD-10-CM | POA: Diagnosis not present

## 2016-04-23 DIAGNOSIS — I110 Hypertensive heart disease with heart failure: Secondary | ICD-10-CM | POA: Diagnosis not present

## 2016-04-24 ENCOUNTER — Ambulatory Visit (INDEPENDENT_AMBULATORY_CARE_PROVIDER_SITE_OTHER): Payer: Medicare Other | Admitting: Internal Medicine

## 2016-04-24 ENCOUNTER — Encounter: Payer: Self-pay | Admitting: Internal Medicine

## 2016-04-24 VITALS — BP 118/70 | HR 78 | Temp 98.1°F | Wt 178.0 lb

## 2016-04-24 DIAGNOSIS — I959 Hypotension, unspecified: Secondary | ICD-10-CM | POA: Diagnosis not present

## 2016-04-24 DIAGNOSIS — K7689 Other specified diseases of liver: Secondary | ICD-10-CM

## 2016-04-24 DIAGNOSIS — M25561 Pain in right knee: Secondary | ICD-10-CM

## 2016-04-24 DIAGNOSIS — G3183 Dementia with Lewy bodies: Secondary | ICD-10-CM

## 2016-04-24 DIAGNOSIS — W19XXXD Unspecified fall, subsequent encounter: Secondary | ICD-10-CM | POA: Diagnosis not present

## 2016-04-24 DIAGNOSIS — S99912D Unspecified injury of left ankle, subsequent encounter: Secondary | ICD-10-CM | POA: Diagnosis not present

## 2016-04-24 DIAGNOSIS — F0280 Dementia in other diseases classified elsewhere without behavioral disturbance: Secondary | ICD-10-CM

## 2016-04-24 NOTE — Progress Notes (Signed)
Location:  [facility] clinic Provider: Spurgeon Gancarz L. Renato Gails, D.O., C.M.D.  Code Status: DNR Goals of Care:  Advanced Directives 04/24/2016  Does patient have an advance directive? Yes  Type of Estate agent of [place];Out of facility DNR (pink MOST or yellow form)  Copy of advanced directive(s) in chart? Yes  Pre-existing out of facility DNR order (yellow form or pink MOST form) Yellow form placed in chart (order not valid for inpatient use)   Chief Complaint  Patient presents with  . Hospitalization Follow-up    HPI: Patient is a 82 y.o. female seen today for hospital follow-up s/p admission from 7/4-7/6 with weakness and doing poorly.  Her daughter had given her one extra dose of lasix for chf.  She c/o RUQ abdominal pain three days before that resolved with a large bm.  UA of course was positive.  She was treated for a UTI with cipro.  Urine culture did not grow anything.  CT in 11/16 showed hepatic cyst and her daughter was requesting an MRI to f/u on that, but pt could not do due to agitation and claustrophobia.  CHF was well compensated.  She was discharged back home to her [place] apt with her daughter and increased caregivers (had been at ILF with caregivers, but was really in need of SNF care).  7/10-11, she fell.  Shanda Bumps d/c'd her coreg after that fall.  Foot had turned under her leg.  She says it does not hurt now.  Right knee is still swollen.  Some discomfort on exam at the knee itself.  She c/o chest pain for a while on 7/12.  Was felt to be noncardiac at ED.  It recurred the next day.  Her son in law gave her a reflux pill and it was better after that.    So, overall no true abnormal findings.  Seems her lethargy and decline was related to constipation only.    Further discussion about the hepatic lesions from November, we decided it did not make sense to look into it further b/c it was likely cysts.    Is agreeable to palliative care.  Getting her here is getting hard  all the time.  Using [employer] [facility] transport.    Past Medical History  Diagnosis Date  . Hypertension   . High cholesterol   . Mitral valve prolapse   . Dementia   . Parkinson's disease, Lewy body (HCC)   . Unspecified constipation   . Onychia and paronychia of toe   . Spasm of muscle   . Abnormality of gait   . Orthostatic hypotension   . Major depressive disorder, single episode, unspecified (HCC)   . Diaphragmatic hernia without mention of obstruction or gangrene   . Alzheimer's disease   . Osteoarthrosis, unspecified whether generalized or localized, unspecified site   . Osteoporosis, unspecified   . Unspecified constipation   . Sprain of ribs   . Delirium due to conditions classified elsewhere   . Diarrhea   . Edema   . Pain in joint, pelvic region and thigh   . Other malaise and fatigue   . Urinary frequency [id number]  . Personal history of fall   . Female stress incontinence   . Other and unspecified hyperlipidemia   . Obesity, unspecified   . Osteoarthrosis, unspecified whether generalized or localized, unspecified site   . Pain in joint, shoulder region   . Transient ischemic attack (TIA), and cerebral infarction without residual deficits   . Risk  for falls     Past Surgical History  Procedure Laterality Date  . Joint replacement    . Tonsillectomy    . Ankle surgery Left 1998  . Total knee arthroplasty Right 2002  . Total knee arthroplasty Left 2005  . Hip arthroplasty Right 09/24/2013    Procedure: ARTHROPLASTY BIPOLAR HIP;  Surgeon: Loanne Drilling, MD;  Location: [facility];  Service: Orthopedics;  Laterality: Right;    Allergies  Allergen Reactions  . Cefepime Itching    Redness to forehead  . Cephalexin Nausea And Vomiting  . Sulfa Antibiotics Other (See Comments)    Daughter unsure of reaction, but believes it is severe  . Sulfites     unknown  . Vancomycin Other (See Comments)    Welps, hives, itching, redness in skin      Medication List         This list is accurate as of: 04/24/16  3:22 PM.  Always use your most recent med list.               acetaminophen 650 MG CR tablet  Commonly known as:  TYLENOL  Take 1,300 mg by mouth 2 (two) times daily.     albuterol 1.25 MG/3ML nebulizer solution  Commonly known as:  ACCUNEB  Take 3 mLs (1.25 mg total) by nebulization every 6 (six) hours as needed for wheezing.     ALIGN 4 MG Caps  Take 1 capsule by mouth daily.     BENEFIBER Powd  Take by mouth 2 (two) times daily.     CALCIUM CITRATE-VITAMIN D PO  Take by mouth 2 (two) times daily.     CENTRUM SILVER ULTRA WOMENS PO  Take 1 tablet by mouth every morning.     citalopram 20 MG tablet  Commonly known as:  CELEXA  TAKE 1 TABLET BY MOUTH ONCE A DAY.     clopidogrel 75 MG tablet  Commonly known as:  PLAVIX  TAKE 1 TABLET BY MOUTH ONCE A DAY TO HELP CIRCULATION.     COLACE 100 MG capsule  Generic drug:  docusate sodium  Take 100 mg by mouth at bedtime.     esomeprazole 40 MG capsule  Commonly known as:  NEXIUM  Take 1 capsule (40 mg total) by mouth daily at 12 noon.     EXELON 4.6 mg/24hr  Generic drug:  rivastigmine  APPLY 1 PATCH TO SKIN DAILY.REMOVE USED PATCH.     feeding supplement Liqd  Take 1 Container by mouth as needed.     furosemide 20 MG tablet  Commonly known as:  LASIX  TAKE 1 TAB BY MOUTH ONCE DAILY UNLESS WEIGHT GAIN OF 3 LBS IN 1 DAY OR 5 LBS IN 1 WEEK,TAKE 2 TABS BY MOUTH AS NEEDED FOR SWELLING.     haloperidol 0.5 MG tablet  Commonly known as:  HALDOL  Take one tablet by mouth once daily as needed for agitation/psychosis     Melatonin 1 MG/4ML Liqd  Take 1 mL (0.25 mg total) by mouth at bedtime as needed (insomnia).     mirabegron ER 50 MG Tb24 tablet  Commonly known as:  MYRBETRIQ  Take one tablet by mouth once daily for bladder     MUCINEX MAXIMUM STRENGTH 1200 MG Tb12  Generic drug:  Guaifenesin  Take 1,200 mg by mouth daily. Reported on 11/06/2015     NAMENDA XR 28 MG Cp24 24  hr capsule  Generic drug:  memantine  TAKE 1 CAPSULE  BY MOUTH EVERY DAY FOR MEMORY.     omega-3 acid ethyl esters 1 g capsule  Commonly known as:  LOVAZA  Take two capsules by mouth twice daily for cholesterol     Pramox-PE-Glycerin-Petrolatum 1-0.25-14.4-15 % Crea  Commonly known as:  PREPARATION H  Apply to hemorrhoids as needed for pain or itching     rosuvastatin 10 MG tablet  Commonly known as:  CRESTOR  TAKE (1) TABLET BY MOUTH AT BEDTIME.     vitamin C with rose hips 1000 MG tablet  Take 1,000 mg by mouth daily.     Vitamin D3 2000 units Tabs  Take 2,000 Units by mouth daily.       Review of Systems:  Review of Systems  Constitutional: Negative for fever and chills.  HENT: Negative for congestion.   Eyes: Negative for blurred vision.  Respiratory: Negative for cough and shortness of breath.   Cardiovascular: Positive for leg swelling. Negative for chest pain and palpitations.       Compression hose  Gastrointestinal: Positive for constipation. Negative for abdominal pain, diarrhea, blood in stool and melena.  Genitourinary: Negative for dysuria.  Musculoskeletal: Positive for falls.  Neurological: Negative for dizziness.  Psychiatric/Behavioral: Positive for depression, hallucinations and memory loss.    Health Maintenance  Topic Date Due  . ZOSTAVAX  09/16/1989  . INFLUENZA VACCINE  05/06/2016  . TETANUS/TDAP  05/13/2025  . DEXA SCAN  Completed  . PNA vac Low Risk Adult  Completed   Physical Exam: Filed Vitals:   04/24/16 1503  BP: 118/70  Pulse: 78  Temp: 98.1 F (36.7 C)  TempSrc: Oral  Weight: 178 lb (80.74 kg)  SpO2: 95%   Body mass index is 29.62 kg/(m^2). Physical Exam  Constitutional: She appears well-developed and well-nourished. No distress.  HENT:  Head: Normocephalic and atraumatic.  Cardiovascular: Intact distal pulses.   irreg irreg  Pulmonary/Chest: Effort normal and breath sounds normal. No respiratory distress. She has no  wheezes. She has no rales.  Abdominal: Soft. Bowel sounds are normal. She exhibits no distension and no mass. There is no tenderness.  Musculoskeletal: Normal range of motion. She exhibits edema.  Right knee with medial tenderness and small effusion  Neurological:  Lethargic today, falling asleep during appointment  Skin: Skin is warm and dry. There is pallor.  Psychiatric: She has a normal mood and affect.  Very pleasant when I woke her up    Labs reviewed: Basic Metabolic Panel:  Recent Labs  16/10/96 2323  04/09/16 0501 04/10/16 0456 04/16/16 0850  NA 136  < > 140 138 139  K 3.8  < > 3.5 3.6 3.5  CL 102  < > 107 106 103  CO2 28  --  27 26 28   GLUCOSE 89  < > 80 101* 95  BUN 12  < > 12 16 10   CREATININE 0.66  < > 0.68 0.96 0.79  CALCIUM 8.5*  --  8.4* 8.4* 9.1  MG 2.1  --   --   --  1.8  < > = values in this interval not displayed. Liver Function Tests:  Recent Labs  04/08/16 2323 04/09/16 0501 04/16/16 0850  AST 32 23 26  ALT 14 13* 12*  ALKPHOS 42 42 43  BILITOT 1.5* 1.0 0.6  PROT 6.6 6.2* 6.0*  ALBUMIN 3.4* 3.1* 3.0*    Recent Labs  04/08/16 2323  LIPASE 31   No results for input(s): AMMONIA in the last 8760 hours. CBC:  Recent Labs  03/17/16 1118 04/08/16 2323  04/09/16 0501 04/10/16 0456 04/16/16 0850  WBC 5.6 5.4  --  5.7 5.9 4.2  NEUTROABS 3.2 2.8  --  3.0  --   --   HGB  --  12.0  < > 11.9* 11.3* 11.6*  HCT 37.0 35.2*  < > 36.0 33.8* 35.9*  MCV 93 94.4  --  98.1 97.7 99.2  PLT 230 195  --  184 179 205  < > = values in this interval not displayed. Lipid Panel: No results for input(s): CHOL, HDL, LDLCALC, TRIG, CHOLHDL, LDLDIRECT in the last 8760 hours. No results found for: HGBA1C  Procedures since last visit: Dg Chest 2 View  04/16/2016  CLINICAL DATA:  Chest pain EXAM: CHEST  2 VIEW COMPARISON:  04/08/2016 FINDINGS: Cardiac shadow is mildly enlarged but stable. The thoracic aorta is tortuous with mild calcifications. The lungs are  well aerated bilaterally. A small calcified granuloma is noted in the right lung base. No acute bony abnormality is seen. IMPRESSION: No acute abnormality noted. Prior granulomatous disease. Aortic atherosclerosis. Electronically Signed   By: Alcide Clever M.D.   On: 04/16/2016 09:59   Dg Knee 2 Views Right  04/16/2016  CLINICAL DATA:  Right knee pain, no known injury, initial encounter EXAM: RIGHT KNEE - 1-2 VIEW COMPARISON:  None. FINDINGS: Right knee replacement is seen. No signs of loosening are noted. No acute fracture or dislocation is seen. No joint effusion is noted. IMPRESSION: No acute abnormality noted. Electronically Signed   By: Alcide Clever M.D.   On: 04/16/2016 10:00   Dg Ankle 2 Views Left  04/16/2016  CLINICAL DATA:  Left ankle pain, no known injury, initial encounter EXAM: LEFT ANKLE - 2 VIEW COMPARISON:  None. FINDINGS: Fixation sideplate is noted along the distal fibula with multiple fixation screws. No acute fracture or dislocation is noted. No soft tissue abnormality is seen. Calcaneal spurs are noted. Irregularity of the distal talus is seen consistent with prior injury. IMPRESSION: No acute abnormality noted. Electronically Signed   By: Alcide Clever M.D.   On: 04/16/2016 10:03    Assessment/Plan 1. Fall, subsequent encounter - landed on right knee and left ankle bent beneath her -is getting to where she needs a lift for transfers and her daughter is unable to bring her to appts at all (using transport company now) -weakness is worsening as her dementia progresses  2. Right knee pain -remains mildly swollen and tender medially, but more intensive imaging not appropriate when xray was negative and she does not appear in pain except with palpation -use tylenol for pain bid  3. Left ankle injury, subsequent encounter -appears this has resolved and healed, no tenderness on exam or overt swelling  4. Lewy body dementia (HCC) -dementia portion worsening with more waxing and  waning of mentation--her daughter reports somewhat rapid cycling of her lethargy and highly alert periods which she likens to bipolar -her daughter is very observant and notes slight changes in her mom -these are leading to more hospitalizations and appts to address small concerns that are likely day to day fluctuations in her mentation related to her dementia -she agrees with a palliative care referral   5. Hepatic cyst -discussed that based on the radiologic reading and her normal transaminases and liver enzymes, lack of related symptoms, these are likely cysts are on her liver and putting her through an MRI would be just about impossible and probably just confirm this after all -  her daughter agrees to avoid further unnecessary tests in her mom With advanced dementia  6. Hypotension, unspecified hypotension type -has historically been orthostatic -bp meds now discontinued except prn lasix for weight gain  Labs/tests ordered:  No orders of the defined types were placed in this encounter.  Next appt:  06/19/2016  Daviana Haymaker L. Demere Dotzler, D.O. Geriatrics [facility] [address] [place], [place] [address] Cell Phone (Mon-Fri 8am-5pm):  [phone] On Call:  [phone] & follow prompts after 5pm & weekends Office Phone:  [phone] Office Fax:  [phone]

## 2016-04-25 DIAGNOSIS — M6281 Muscle weakness (generalized): Secondary | ICD-10-CM | POA: Diagnosis not present

## 2016-04-25 DIAGNOSIS — I5032 Chronic diastolic (congestive) heart failure: Secondary | ICD-10-CM | POA: Diagnosis not present

## 2016-04-25 DIAGNOSIS — I110 Hypertensive heart disease with heart failure: Secondary | ICD-10-CM | POA: Diagnosis not present

## 2016-04-25 DIAGNOSIS — G20 Parkinson's disease: Secondary | ICD-10-CM | POA: Diagnosis not present

## 2016-04-25 DIAGNOSIS — R260 Ataxic gait: Secondary | ICD-10-CM | POA: Diagnosis not present

## 2016-04-25 DIAGNOSIS — F0281 Dementia in other diseases classified elsewhere with behavioral disturbance: Secondary | ICD-10-CM | POA: Diagnosis not present

## 2016-04-28 DIAGNOSIS — M6281 Muscle weakness (generalized): Secondary | ICD-10-CM | POA: Diagnosis not present

## 2016-04-28 DIAGNOSIS — R260 Ataxic gait: Secondary | ICD-10-CM | POA: Diagnosis not present

## 2016-04-28 DIAGNOSIS — I5032 Chronic diastolic (congestive) heart failure: Secondary | ICD-10-CM | POA: Diagnosis not present

## 2016-04-28 DIAGNOSIS — G20 Parkinson's disease: Secondary | ICD-10-CM | POA: Diagnosis not present

## 2016-04-28 DIAGNOSIS — R531 Weakness: Secondary | ICD-10-CM | POA: Diagnosis not present

## 2016-04-28 DIAGNOSIS — F0281 Dementia in other diseases classified elsewhere with behavioral disturbance: Secondary | ICD-10-CM | POA: Diagnosis not present

## 2016-04-28 DIAGNOSIS — I110 Hypertensive heart disease with heart failure: Secondary | ICD-10-CM | POA: Diagnosis not present

## 2016-04-29 DIAGNOSIS — M6281 Muscle weakness (generalized): Secondary | ICD-10-CM | POA: Diagnosis not present

## 2016-04-29 DIAGNOSIS — I110 Hypertensive heart disease with heart failure: Secondary | ICD-10-CM | POA: Diagnosis not present

## 2016-04-29 DIAGNOSIS — R260 Ataxic gait: Secondary | ICD-10-CM | POA: Diagnosis not present

## 2016-04-29 DIAGNOSIS — G20 Parkinson's disease: Secondary | ICD-10-CM | POA: Diagnosis not present

## 2016-04-29 DIAGNOSIS — I5032 Chronic diastolic (congestive) heart failure: Secondary | ICD-10-CM | POA: Diagnosis not present

## 2016-04-29 DIAGNOSIS — F0281 Dementia in other diseases classified elsewhere with behavioral disturbance: Secondary | ICD-10-CM | POA: Diagnosis not present

## 2016-04-30 DIAGNOSIS — I110 Hypertensive heart disease with heart failure: Secondary | ICD-10-CM | POA: Diagnosis not present

## 2016-04-30 DIAGNOSIS — I5032 Chronic diastolic (congestive) heart failure: Secondary | ICD-10-CM | POA: Diagnosis not present

## 2016-04-30 DIAGNOSIS — F0281 Dementia in other diseases classified elsewhere with behavioral disturbance: Secondary | ICD-10-CM | POA: Diagnosis not present

## 2016-04-30 DIAGNOSIS — M6281 Muscle weakness (generalized): Secondary | ICD-10-CM | POA: Diagnosis not present

## 2016-04-30 DIAGNOSIS — R260 Ataxic gait: Secondary | ICD-10-CM | POA: Diagnosis not present

## 2016-04-30 DIAGNOSIS — G20 Parkinson's disease: Secondary | ICD-10-CM | POA: Diagnosis not present

## 2016-05-02 DIAGNOSIS — R260 Ataxic gait: Secondary | ICD-10-CM | POA: Diagnosis not present

## 2016-05-02 DIAGNOSIS — M6281 Muscle weakness (generalized): Secondary | ICD-10-CM | POA: Diagnosis not present

## 2016-05-02 DIAGNOSIS — Z96653 Presence of artificial knee joint, bilateral: Secondary | ICD-10-CM | POA: Diagnosis not present

## 2016-05-02 DIAGNOSIS — M810 Age-related osteoporosis without current pathological fracture: Secondary | ICD-10-CM | POA: Diagnosis not present

## 2016-05-02 DIAGNOSIS — F0281 Dementia in other diseases classified elsewhere with behavioral disturbance: Secondary | ICD-10-CM | POA: Diagnosis not present

## 2016-05-02 DIAGNOSIS — I5032 Chronic diastolic (congestive) heart failure: Secondary | ICD-10-CM | POA: Diagnosis not present

## 2016-05-02 DIAGNOSIS — Z7902 Long term (current) use of antithrombotics/antiplatelets: Secondary | ICD-10-CM | POA: Diagnosis not present

## 2016-05-02 DIAGNOSIS — G20 Parkinson's disease: Secondary | ICD-10-CM | POA: Diagnosis not present

## 2016-05-02 DIAGNOSIS — I110 Hypertensive heart disease with heart failure: Secondary | ICD-10-CM | POA: Diagnosis not present

## 2016-05-02 DIAGNOSIS — I950 Idiopathic hypotension: Secondary | ICD-10-CM | POA: Diagnosis not present

## 2016-05-02 DIAGNOSIS — Z96641 Presence of right artificial hip joint: Secondary | ICD-10-CM | POA: Diagnosis not present

## 2016-05-02 DIAGNOSIS — F319 Bipolar disorder, unspecified: Secondary | ICD-10-CM | POA: Diagnosis not present

## 2016-05-02 DIAGNOSIS — I2510 Atherosclerotic heart disease of native coronary artery without angina pectoris: Secondary | ICD-10-CM | POA: Diagnosis not present

## 2016-05-02 DIAGNOSIS — Z9181 History of falling: Secondary | ICD-10-CM | POA: Diagnosis not present

## 2016-05-05 DIAGNOSIS — F0281 Dementia in other diseases classified elsewhere with behavioral disturbance: Secondary | ICD-10-CM | POA: Diagnosis not present

## 2016-05-05 DIAGNOSIS — I5032 Chronic diastolic (congestive) heart failure: Secondary | ICD-10-CM | POA: Diagnosis not present

## 2016-05-05 DIAGNOSIS — R260 Ataxic gait: Secondary | ICD-10-CM | POA: Diagnosis not present

## 2016-05-05 DIAGNOSIS — G20 Parkinson's disease: Secondary | ICD-10-CM | POA: Diagnosis not present

## 2016-05-05 DIAGNOSIS — M6281 Muscle weakness (generalized): Secondary | ICD-10-CM | POA: Diagnosis not present

## 2016-05-05 DIAGNOSIS — I110 Hypertensive heart disease with heart failure: Secondary | ICD-10-CM | POA: Diagnosis not present

## 2016-05-06 DIAGNOSIS — I110 Hypertensive heart disease with heart failure: Secondary | ICD-10-CM | POA: Diagnosis not present

## 2016-05-06 DIAGNOSIS — G20 Parkinson's disease: Secondary | ICD-10-CM | POA: Diagnosis not present

## 2016-05-06 DIAGNOSIS — M6281 Muscle weakness (generalized): Secondary | ICD-10-CM | POA: Diagnosis not present

## 2016-05-06 DIAGNOSIS — F0281 Dementia in other diseases classified elsewhere with behavioral disturbance: Secondary | ICD-10-CM | POA: Diagnosis not present

## 2016-05-06 DIAGNOSIS — R260 Ataxic gait: Secondary | ICD-10-CM | POA: Diagnosis not present

## 2016-05-06 DIAGNOSIS — I5032 Chronic diastolic (congestive) heart failure: Secondary | ICD-10-CM | POA: Diagnosis not present

## 2016-05-08 DIAGNOSIS — I5032 Chronic diastolic (congestive) heart failure: Secondary | ICD-10-CM | POA: Diagnosis not present

## 2016-05-08 DIAGNOSIS — F0281 Dementia in other diseases classified elsewhere with behavioral disturbance: Secondary | ICD-10-CM | POA: Diagnosis not present

## 2016-05-08 DIAGNOSIS — R260 Ataxic gait: Secondary | ICD-10-CM | POA: Diagnosis not present

## 2016-05-08 DIAGNOSIS — I110 Hypertensive heart disease with heart failure: Secondary | ICD-10-CM | POA: Diagnosis not present

## 2016-05-08 DIAGNOSIS — G20 Parkinson's disease: Secondary | ICD-10-CM | POA: Diagnosis not present

## 2016-05-08 DIAGNOSIS — M6281 Muscle weakness (generalized): Secondary | ICD-10-CM | POA: Diagnosis not present

## 2016-05-09 DIAGNOSIS — F0281 Dementia in other diseases classified elsewhere with behavioral disturbance: Secondary | ICD-10-CM | POA: Diagnosis not present

## 2016-05-09 DIAGNOSIS — G20 Parkinson's disease: Secondary | ICD-10-CM | POA: Diagnosis not present

## 2016-05-09 DIAGNOSIS — R260 Ataxic gait: Secondary | ICD-10-CM | POA: Diagnosis not present

## 2016-05-09 DIAGNOSIS — I110 Hypertensive heart disease with heart failure: Secondary | ICD-10-CM | POA: Diagnosis not present

## 2016-05-09 DIAGNOSIS — M6281 Muscle weakness (generalized): Secondary | ICD-10-CM | POA: Diagnosis not present

## 2016-05-09 DIAGNOSIS — I5032 Chronic diastolic (congestive) heart failure: Secondary | ICD-10-CM | POA: Diagnosis not present

## 2016-05-13 DIAGNOSIS — G20 Parkinson's disease: Secondary | ICD-10-CM | POA: Diagnosis not present

## 2016-05-13 DIAGNOSIS — I5032 Chronic diastolic (congestive) heart failure: Secondary | ICD-10-CM | POA: Diagnosis not present

## 2016-05-13 DIAGNOSIS — R260 Ataxic gait: Secondary | ICD-10-CM | POA: Diagnosis not present

## 2016-05-13 DIAGNOSIS — F0281 Dementia in other diseases classified elsewhere with behavioral disturbance: Secondary | ICD-10-CM | POA: Diagnosis not present

## 2016-05-13 DIAGNOSIS — M6281 Muscle weakness (generalized): Secondary | ICD-10-CM | POA: Diagnosis not present

## 2016-05-13 DIAGNOSIS — I110 Hypertensive heart disease with heart failure: Secondary | ICD-10-CM | POA: Diagnosis not present

## 2016-05-14 DIAGNOSIS — M6281 Muscle weakness (generalized): Secondary | ICD-10-CM | POA: Diagnosis not present

## 2016-05-14 DIAGNOSIS — I110 Hypertensive heart disease with heart failure: Secondary | ICD-10-CM | POA: Diagnosis not present

## 2016-05-14 DIAGNOSIS — F0281 Dementia in other diseases classified elsewhere with behavioral disturbance: Secondary | ICD-10-CM | POA: Diagnosis not present

## 2016-05-14 DIAGNOSIS — I5032 Chronic diastolic (congestive) heart failure: Secondary | ICD-10-CM | POA: Diagnosis not present

## 2016-05-14 DIAGNOSIS — G20 Parkinson's disease: Secondary | ICD-10-CM | POA: Diagnosis not present

## 2016-05-14 DIAGNOSIS — R260 Ataxic gait: Secondary | ICD-10-CM | POA: Diagnosis not present

## 2016-05-15 DIAGNOSIS — G20 Parkinson's disease: Secondary | ICD-10-CM | POA: Diagnosis not present

## 2016-05-15 DIAGNOSIS — M6281 Muscle weakness (generalized): Secondary | ICD-10-CM | POA: Diagnosis not present

## 2016-05-15 DIAGNOSIS — F0281 Dementia in other diseases classified elsewhere with behavioral disturbance: Secondary | ICD-10-CM | POA: Diagnosis not present

## 2016-05-15 DIAGNOSIS — I110 Hypertensive heart disease with heart failure: Secondary | ICD-10-CM | POA: Diagnosis not present

## 2016-05-15 DIAGNOSIS — R260 Ataxic gait: Secondary | ICD-10-CM | POA: Diagnosis not present

## 2016-05-15 DIAGNOSIS — I5032 Chronic diastolic (congestive) heart failure: Secondary | ICD-10-CM | POA: Diagnosis not present

## 2016-05-16 DIAGNOSIS — M6281 Muscle weakness (generalized): Secondary | ICD-10-CM | POA: Diagnosis not present

## 2016-05-16 DIAGNOSIS — G20 Parkinson's disease: Secondary | ICD-10-CM | POA: Diagnosis not present

## 2016-05-16 DIAGNOSIS — R260 Ataxic gait: Secondary | ICD-10-CM | POA: Diagnosis not present

## 2016-05-16 DIAGNOSIS — I5032 Chronic diastolic (congestive) heart failure: Secondary | ICD-10-CM | POA: Diagnosis not present

## 2016-05-16 DIAGNOSIS — F0281 Dementia in other diseases classified elsewhere with behavioral disturbance: Secondary | ICD-10-CM | POA: Diagnosis not present

## 2016-05-16 DIAGNOSIS — I110 Hypertensive heart disease with heart failure: Secondary | ICD-10-CM | POA: Diagnosis not present

## 2016-05-19 ENCOUNTER — Other Ambulatory Visit: Payer: Self-pay | Admitting: Internal Medicine

## 2016-05-19 DIAGNOSIS — M6281 Muscle weakness (generalized): Secondary | ICD-10-CM | POA: Diagnosis not present

## 2016-05-19 DIAGNOSIS — R260 Ataxic gait: Secondary | ICD-10-CM | POA: Diagnosis not present

## 2016-05-19 DIAGNOSIS — G20 Parkinson's disease: Secondary | ICD-10-CM | POA: Diagnosis not present

## 2016-05-19 DIAGNOSIS — I110 Hypertensive heart disease with heart failure: Secondary | ICD-10-CM | POA: Diagnosis not present

## 2016-05-19 DIAGNOSIS — F0281 Dementia in other diseases classified elsewhere with behavioral disturbance: Secondary | ICD-10-CM | POA: Diagnosis not present

## 2016-05-19 DIAGNOSIS — I5032 Chronic diastolic (congestive) heart failure: Secondary | ICD-10-CM | POA: Diagnosis not present

## 2016-05-21 DIAGNOSIS — M6281 Muscle weakness (generalized): Secondary | ICD-10-CM | POA: Diagnosis not present

## 2016-05-21 DIAGNOSIS — I110 Hypertensive heart disease with heart failure: Secondary | ICD-10-CM | POA: Diagnosis not present

## 2016-05-21 DIAGNOSIS — F0281 Dementia in other diseases classified elsewhere with behavioral disturbance: Secondary | ICD-10-CM | POA: Diagnosis not present

## 2016-05-21 DIAGNOSIS — R260 Ataxic gait: Secondary | ICD-10-CM | POA: Diagnosis not present

## 2016-05-21 DIAGNOSIS — G20 Parkinson's disease: Secondary | ICD-10-CM | POA: Diagnosis not present

## 2016-05-21 DIAGNOSIS — I5032 Chronic diastolic (congestive) heart failure: Secondary | ICD-10-CM | POA: Diagnosis not present

## 2016-05-22 DIAGNOSIS — I110 Hypertensive heart disease with heart failure: Secondary | ICD-10-CM | POA: Diagnosis not present

## 2016-05-22 DIAGNOSIS — R260 Ataxic gait: Secondary | ICD-10-CM | POA: Diagnosis not present

## 2016-05-22 DIAGNOSIS — G20 Parkinson's disease: Secondary | ICD-10-CM | POA: Diagnosis not present

## 2016-05-22 DIAGNOSIS — F0281 Dementia in other diseases classified elsewhere with behavioral disturbance: Secondary | ICD-10-CM | POA: Diagnosis not present

## 2016-05-22 DIAGNOSIS — I5032 Chronic diastolic (congestive) heart failure: Secondary | ICD-10-CM | POA: Diagnosis not present

## 2016-05-22 DIAGNOSIS — M6281 Muscle weakness (generalized): Secondary | ICD-10-CM | POA: Diagnosis not present

## 2016-05-23 DIAGNOSIS — I110 Hypertensive heart disease with heart failure: Secondary | ICD-10-CM | POA: Diagnosis not present

## 2016-05-23 DIAGNOSIS — F0281 Dementia in other diseases classified elsewhere with behavioral disturbance: Secondary | ICD-10-CM | POA: Diagnosis not present

## 2016-05-23 DIAGNOSIS — R260 Ataxic gait: Secondary | ICD-10-CM | POA: Diagnosis not present

## 2016-05-23 DIAGNOSIS — M6281 Muscle weakness (generalized): Secondary | ICD-10-CM | POA: Diagnosis not present

## 2016-05-23 DIAGNOSIS — G20 Parkinson's disease: Secondary | ICD-10-CM | POA: Diagnosis not present

## 2016-05-23 DIAGNOSIS — I5032 Chronic diastolic (congestive) heart failure: Secondary | ICD-10-CM | POA: Diagnosis not present

## 2016-05-27 DIAGNOSIS — I5032 Chronic diastolic (congestive) heart failure: Secondary | ICD-10-CM | POA: Diagnosis not present

## 2016-05-27 DIAGNOSIS — M6281 Muscle weakness (generalized): Secondary | ICD-10-CM | POA: Diagnosis not present

## 2016-05-27 DIAGNOSIS — R260 Ataxic gait: Secondary | ICD-10-CM | POA: Diagnosis not present

## 2016-05-27 DIAGNOSIS — F0281 Dementia in other diseases classified elsewhere with behavioral disturbance: Secondary | ICD-10-CM | POA: Diagnosis not present

## 2016-05-27 DIAGNOSIS — G20 Parkinson's disease: Secondary | ICD-10-CM | POA: Diagnosis not present

## 2016-05-27 DIAGNOSIS — I110 Hypertensive heart disease with heart failure: Secondary | ICD-10-CM | POA: Diagnosis not present

## 2016-05-28 DIAGNOSIS — R260 Ataxic gait: Secondary | ICD-10-CM | POA: Diagnosis not present

## 2016-05-28 DIAGNOSIS — M6281 Muscle weakness (generalized): Secondary | ICD-10-CM | POA: Diagnosis not present

## 2016-05-28 DIAGNOSIS — I110 Hypertensive heart disease with heart failure: Secondary | ICD-10-CM | POA: Diagnosis not present

## 2016-05-28 DIAGNOSIS — G20 Parkinson's disease: Secondary | ICD-10-CM | POA: Diagnosis not present

## 2016-05-28 DIAGNOSIS — F0281 Dementia in other diseases classified elsewhere with behavioral disturbance: Secondary | ICD-10-CM | POA: Diagnosis not present

## 2016-05-28 DIAGNOSIS — I5032 Chronic diastolic (congestive) heart failure: Secondary | ICD-10-CM | POA: Diagnosis not present

## 2016-05-30 DIAGNOSIS — G20 Parkinson's disease: Secondary | ICD-10-CM | POA: Diagnosis not present

## 2016-05-30 DIAGNOSIS — M6281 Muscle weakness (generalized): Secondary | ICD-10-CM | POA: Diagnosis not present

## 2016-05-30 DIAGNOSIS — I5032 Chronic diastolic (congestive) heart failure: Secondary | ICD-10-CM | POA: Diagnosis not present

## 2016-05-30 DIAGNOSIS — F0281 Dementia in other diseases classified elsewhere with behavioral disturbance: Secondary | ICD-10-CM | POA: Diagnosis not present

## 2016-05-30 DIAGNOSIS — I110 Hypertensive heart disease with heart failure: Secondary | ICD-10-CM | POA: Diagnosis not present

## 2016-05-30 DIAGNOSIS — R260 Ataxic gait: Secondary | ICD-10-CM | POA: Diagnosis not present

## 2016-06-02 ENCOUNTER — Telehealth: Payer: Self-pay | Admitting: *Deleted

## 2016-06-02 DIAGNOSIS — M6281 Muscle weakness (generalized): Secondary | ICD-10-CM | POA: Diagnosis not present

## 2016-06-02 DIAGNOSIS — G20 Parkinson's disease: Secondary | ICD-10-CM | POA: Diagnosis not present

## 2016-06-02 DIAGNOSIS — I5032 Chronic diastolic (congestive) heart failure: Secondary | ICD-10-CM | POA: Diagnosis not present

## 2016-06-02 DIAGNOSIS — R260 Ataxic gait: Secondary | ICD-10-CM | POA: Diagnosis not present

## 2016-06-02 DIAGNOSIS — F0281 Dementia in other diseases classified elsewhere with behavioral disturbance: Secondary | ICD-10-CM | POA: Diagnosis not present

## 2016-06-02 DIAGNOSIS — I110 Hypertensive heart disease with heart failure: Secondary | ICD-10-CM | POA: Diagnosis not present

## 2016-06-02 NOTE — Telephone Encounter (Signed)
Noted. Thanks.

## 2016-06-02 NOTE — Telephone Encounter (Signed)
Pete with [facility] called and stated that patient fell yesterday. Sleeps most of the time. Therapist doesn't think she needs a x-ray and daughter doesn't want to take her to the ER. In one of her Manic stages and sleeping all day today. Going to assess her more tomorrow and call back. He just wanted to let you know.

## 2016-06-03 ENCOUNTER — Telehealth: Payer: Self-pay | Admitting: *Deleted

## 2016-06-03 NOTE — Telephone Encounter (Signed)
Selena BattenKim, daughter called and stated that patient is not doing well since Saturday. She is getting worse, not eating, not drinking, not taking medications and has been in the bed for several days. Daughter is concerned about dehydration and urine is dark. Patient wants to know if Home Health can evaluate her and see if she is in need of IV fluids. Please Advise.   Selena BattenKim stated that she spoke with Dicky DoeNiki Morton at [facility] and she told her that they can do IV in home. Kim stated that Solmon Iceiki will be calling the office to get order to do home health assessment.

## 2016-06-04 ENCOUNTER — Telehealth: Payer: Self-pay

## 2016-06-04 DIAGNOSIS — G20 Parkinson's disease: Secondary | ICD-10-CM | POA: Diagnosis not present

## 2016-06-04 DIAGNOSIS — M6281 Muscle weakness (generalized): Secondary | ICD-10-CM | POA: Diagnosis not present

## 2016-06-04 DIAGNOSIS — R260 Ataxic gait: Secondary | ICD-10-CM | POA: Diagnosis not present

## 2016-06-04 DIAGNOSIS — I5032 Chronic diastolic (congestive) heart failure: Secondary | ICD-10-CM | POA: Diagnosis not present

## 2016-06-04 DIAGNOSIS — F0281 Dementia in other diseases classified elsewhere with behavioral disturbance: Secondary | ICD-10-CM | POA: Diagnosis not present

## 2016-06-04 DIAGNOSIS — I110 Hypertensive heart disease with heart failure: Secondary | ICD-10-CM | POA: Diagnosis not present

## 2016-06-04 DIAGNOSIS — R6889 Other general symptoms and signs: Secondary | ICD-10-CM | POA: Diagnosis not present

## 2016-06-04 NOTE — Telephone Encounter (Signed)
Ok, fyi on the copay thing--Carlos from hospice has said that pts will be billed for the palliative care visits, but they are writing them off so they do not have to pay.

## 2016-06-04 NOTE — Telephone Encounter (Signed)
Is palliative care following her?  I thought they were.  This would be a good time for them to be involved.  If Holly Grimes does not want palliative care at this time, it's ok to do the home health referral for [facility] to evaluate for possible dehydration.  When I saw Holly Grimes last, Holly Grimes was talking about palliative care for her.

## 2016-06-04 NOTE — Telephone Encounter (Signed)
Spoke with daughter and Palliative Care came out and evaluated patient but stated that they would have to charge a copay to come out. Everything was already being done for the patient.  Spoke with Britt BoozerNicky with [facility] and gave verbal for nurse evaluation.

## 2016-06-04 NOTE — Telephone Encounter (Signed)
Verbal [facility] (also see telephone message from 06/03/2016). They will evaluate patient and order the needed labwork.  I spoke with daughter and she agreed.

## 2016-06-04 NOTE — Telephone Encounter (Signed)
Nicky with [facility] was calling to request additional orders for nurse evaluation and any labs (if needed). Patient is weak, fatigue, low grade temp (no exact reading) and drinking about 30 mL of fluid daily (possible dehydrated). Patient fell over the weekend and has not recovered.   Please advise

## 2016-06-05 ENCOUNTER — Telehealth: Payer: Self-pay

## 2016-06-05 LAB — CBC AND DIFFERENTIAL
HEMATOCRIT: 34 % — AB (ref 36–46)
Hemoglobin: 11.6 g/dL — AB (ref 12.0–16.0)
NEUTROS ABS: 6 /uL
PLATELETS: 252 10*3/uL (ref 150–399)
WBC: 8.6 10*3/mL

## 2016-06-05 LAB — BASIC METABOLIC PANEL
BUN: 14 mg/dL (ref 4–21)
Creatinine: 0.7 mg/dL (ref 0.5–1.1)
GLUCOSE: 82 mg/dL
Potassium: 3.5 mmol/L (ref 3.4–5.3)
Sodium: 141 mmol/L (ref 137–147)

## 2016-06-05 NOTE — Telephone Encounter (Signed)
Message left on triage voicemail: Patient's daughter is requesting lab results.   I tried to call Selena BattenKim back to inform her that I will send a message to Shanda BumpsJessica (covering for Dr.Reed) to address labs. Kim's mailbox was full and I was unable to leave a message

## 2016-06-06 NOTE — Telephone Encounter (Signed)
Spoke with patient's daughter and advised results she did not want a copy nor did she have any questions.  Per Dr. Renato Gailseed " all labs normal" ( results will be scanned in)

## 2016-06-12 DIAGNOSIS — F0281 Dementia in other diseases classified elsewhere with behavioral disturbance: Secondary | ICD-10-CM | POA: Diagnosis not present

## 2016-06-12 DIAGNOSIS — I110 Hypertensive heart disease with heart failure: Secondary | ICD-10-CM | POA: Diagnosis not present

## 2016-06-12 DIAGNOSIS — R260 Ataxic gait: Secondary | ICD-10-CM | POA: Diagnosis not present

## 2016-06-12 DIAGNOSIS — G20 Parkinson's disease: Secondary | ICD-10-CM | POA: Diagnosis not present

## 2016-06-12 DIAGNOSIS — M6281 Muscle weakness (generalized): Secondary | ICD-10-CM | POA: Diagnosis not present

## 2016-06-12 DIAGNOSIS — I5032 Chronic diastolic (congestive) heart failure: Secondary | ICD-10-CM | POA: Diagnosis not present

## 2016-06-13 DIAGNOSIS — M6281 Muscle weakness (generalized): Secondary | ICD-10-CM | POA: Diagnosis not present

## 2016-06-13 DIAGNOSIS — R260 Ataxic gait: Secondary | ICD-10-CM | POA: Diagnosis not present

## 2016-06-13 DIAGNOSIS — F0281 Dementia in other diseases classified elsewhere with behavioral disturbance: Secondary | ICD-10-CM | POA: Diagnosis not present

## 2016-06-13 DIAGNOSIS — G20 Parkinson's disease: Secondary | ICD-10-CM | POA: Diagnosis not present

## 2016-06-13 DIAGNOSIS — I5032 Chronic diastolic (congestive) heart failure: Secondary | ICD-10-CM | POA: Diagnosis not present

## 2016-06-13 DIAGNOSIS — I110 Hypertensive heart disease with heart failure: Secondary | ICD-10-CM | POA: Diagnosis not present

## 2016-06-18 ENCOUNTER — Telehealth: Payer: Self-pay | Admitting: *Deleted

## 2016-06-18 NOTE — Telephone Encounter (Signed)
Selena BattenKim, daughter called and stated that she had to cancel patient's appointment for tomorrow. Stated that she can't get her out of bed for 2 weeks. Patient has not had PT since she can't participate. Continues to have alternating low grade fevers vs no temp. Patient acts like she does not feel good. Has tried to get her up but ends up to near falls. It has been a week since she has had a shower and it took 3 people. Is drinking and eating some and taking most of her medications. Very combative when they change her.  Daughter is not sure what to do and would like some advise.

## 2016-06-18 NOTE — Telephone Encounter (Signed)
Is chewing pills, not eating well, weak and spending a lot of time in the bed.  Her daughter is concerned she will develop pressure heels and she ordered pressure cushions but they're not going to arrive for another week.   She had a palliative care eval before and she was doing too well then to meet criteria for hospice. Has been having a low grade temp off and on tylenol.  She was feverish and her lips were swollen last night.  Intake poor yesterday and doesn't eat more than a child's portion anymore.  She cannot even sit up on the side of the bed due to weakness so coming in for an appointment is not an option.    Selena BattenKim is going to try to get back in touch with the nurse who did the palliative care eval before and see if Mrs. Holly Grimes now qualifies for hospice or regular palliative visits.  (It sounds like in her current state she'd qualify for hospice to me.) Selena BattenKim will let us know what happens.

## 2016-06-19 ENCOUNTER — Ambulatory Visit: Payer: Medicare Other | Admitting: Internal Medicine

## 2016-06-20 DIAGNOSIS — R531 Weakness: Secondary | ICD-10-CM | POA: Diagnosis not present

## 2016-06-20 NOTE — Telephone Encounter (Signed)
Synetta FailAnita with pallative care calling asking for order for hospice today, per Dr. Montez Moritaarter ok to fax order for hospice, dx: failure to thrive

## 2016-06-21 DIAGNOSIS — N3289 Other specified disorders of bladder: Secondary | ICD-10-CM | POA: Diagnosis not present

## 2016-06-21 DIAGNOSIS — E785 Hyperlipidemia, unspecified: Secondary | ICD-10-CM | POA: Diagnosis not present

## 2016-06-21 DIAGNOSIS — R630 Anorexia: Secondary | ICD-10-CM | POA: Diagnosis not present

## 2016-06-21 DIAGNOSIS — G20 Parkinson's disease: Secondary | ICD-10-CM | POA: Diagnosis not present

## 2016-06-21 DIAGNOSIS — I5030 Unspecified diastolic (congestive) heart failure: Secondary | ICD-10-CM | POA: Diagnosis not present

## 2016-06-21 DIAGNOSIS — I672 Cerebral atherosclerosis: Secondary | ICD-10-CM | POA: Diagnosis not present

## 2016-06-21 DIAGNOSIS — F0281 Dementia in other diseases classified elsewhere with behavioral disturbance: Secondary | ICD-10-CM | POA: Diagnosis not present

## 2016-06-21 DIAGNOSIS — G3183 Dementia with Lewy bodies: Secondary | ICD-10-CM | POA: Diagnosis not present

## 2016-06-21 DIAGNOSIS — K219 Gastro-esophageal reflux disease without esophagitis: Secondary | ICD-10-CM | POA: Diagnosis not present

## 2016-06-21 DIAGNOSIS — F339 Major depressive disorder, recurrent, unspecified: Secondary | ICD-10-CM | POA: Diagnosis not present

## 2016-06-21 DIAGNOSIS — I10 Essential (primary) hypertension: Secondary | ICD-10-CM | POA: Diagnosis not present

## 2016-06-24 DIAGNOSIS — I10 Essential (primary) hypertension: Secondary | ICD-10-CM | POA: Diagnosis not present

## 2016-06-24 DIAGNOSIS — R630 Anorexia: Secondary | ICD-10-CM | POA: Diagnosis not present

## 2016-06-24 DIAGNOSIS — G3183 Dementia with Lewy bodies: Secondary | ICD-10-CM | POA: Diagnosis not present

## 2016-06-24 DIAGNOSIS — F0281 Dementia in other diseases classified elsewhere with behavioral disturbance: Secondary | ICD-10-CM | POA: Diagnosis not present

## 2016-06-24 DIAGNOSIS — K219 Gastro-esophageal reflux disease without esophagitis: Secondary | ICD-10-CM | POA: Diagnosis not present

## 2016-06-24 DIAGNOSIS — I5030 Unspecified diastolic (congestive) heart failure: Secondary | ICD-10-CM | POA: Diagnosis not present

## 2016-06-26 DIAGNOSIS — I10 Essential (primary) hypertension: Secondary | ICD-10-CM | POA: Diagnosis not present

## 2016-06-26 DIAGNOSIS — I5030 Unspecified diastolic (congestive) heart failure: Secondary | ICD-10-CM | POA: Diagnosis not present

## 2016-06-26 DIAGNOSIS — F0281 Dementia in other diseases classified elsewhere with behavioral disturbance: Secondary | ICD-10-CM | POA: Diagnosis not present

## 2016-06-26 DIAGNOSIS — G3183 Dementia with Lewy bodies: Secondary | ICD-10-CM | POA: Diagnosis not present

## 2016-06-26 DIAGNOSIS — K219 Gastro-esophageal reflux disease without esophagitis: Secondary | ICD-10-CM | POA: Diagnosis not present

## 2016-06-26 DIAGNOSIS — R630 Anorexia: Secondary | ICD-10-CM | POA: Diagnosis not present

## 2016-06-27 DIAGNOSIS — K219 Gastro-esophageal reflux disease without esophagitis: Secondary | ICD-10-CM | POA: Diagnosis not present

## 2016-06-27 DIAGNOSIS — I5030 Unspecified diastolic (congestive) heart failure: Secondary | ICD-10-CM | POA: Diagnosis not present

## 2016-06-27 DIAGNOSIS — R630 Anorexia: Secondary | ICD-10-CM | POA: Diagnosis not present

## 2016-06-27 DIAGNOSIS — I10 Essential (primary) hypertension: Secondary | ICD-10-CM | POA: Diagnosis not present

## 2016-06-27 DIAGNOSIS — G3183 Dementia with Lewy bodies: Secondary | ICD-10-CM | POA: Diagnosis not present

## 2016-06-27 DIAGNOSIS — F0281 Dementia in other diseases classified elsewhere with behavioral disturbance: Secondary | ICD-10-CM | POA: Diagnosis not present

## 2016-06-30 DIAGNOSIS — F0281 Dementia in other diseases classified elsewhere with behavioral disturbance: Secondary | ICD-10-CM | POA: Diagnosis not present

## 2016-06-30 DIAGNOSIS — K219 Gastro-esophageal reflux disease without esophagitis: Secondary | ICD-10-CM | POA: Diagnosis not present

## 2016-06-30 DIAGNOSIS — I5030 Unspecified diastolic (congestive) heart failure: Secondary | ICD-10-CM | POA: Diagnosis not present

## 2016-06-30 DIAGNOSIS — R630 Anorexia: Secondary | ICD-10-CM | POA: Diagnosis not present

## 2016-06-30 DIAGNOSIS — I10 Essential (primary) hypertension: Secondary | ICD-10-CM | POA: Diagnosis not present

## 2016-06-30 DIAGNOSIS — G3183 Dementia with Lewy bodies: Secondary | ICD-10-CM | POA: Diagnosis not present

## 2016-07-01 ENCOUNTER — Other Ambulatory Visit: Payer: Self-pay | Admitting: Internal Medicine

## 2016-07-01 ENCOUNTER — Telehealth: Payer: Self-pay | Admitting: *Deleted

## 2016-07-01 DIAGNOSIS — F0281 Dementia in other diseases classified elsewhere with behavioral disturbance: Secondary | ICD-10-CM | POA: Diagnosis not present

## 2016-07-01 DIAGNOSIS — I5030 Unspecified diastolic (congestive) heart failure: Secondary | ICD-10-CM | POA: Diagnosis not present

## 2016-07-01 DIAGNOSIS — I10 Essential (primary) hypertension: Secondary | ICD-10-CM | POA: Diagnosis not present

## 2016-07-01 DIAGNOSIS — G3183 Dementia with Lewy bodies: Secondary | ICD-10-CM | POA: Diagnosis not present

## 2016-07-01 DIAGNOSIS — K219 Gastro-esophageal reflux disease without esophagitis: Secondary | ICD-10-CM | POA: Diagnosis not present

## 2016-07-01 DIAGNOSIS — R630 Anorexia: Secondary | ICD-10-CM | POA: Diagnosis not present

## 2016-07-01 NOTE — Telephone Encounter (Signed)
Received fax from [employer] program #[phone] Fax: [phone] Prior Authorization Request has been APPROVED for Nexium 04/28/16-06/27/17 Member ID#: [id number]

## 2016-07-03 DIAGNOSIS — K219 Gastro-esophageal reflux disease without esophagitis: Secondary | ICD-10-CM | POA: Diagnosis not present

## 2016-07-03 DIAGNOSIS — I5030 Unspecified diastolic (congestive) heart failure: Secondary | ICD-10-CM | POA: Diagnosis not present

## 2016-07-03 DIAGNOSIS — G3183 Dementia with Lewy bodies: Secondary | ICD-10-CM | POA: Diagnosis not present

## 2016-07-03 DIAGNOSIS — I10 Essential (primary) hypertension: Secondary | ICD-10-CM | POA: Diagnosis not present

## 2016-07-03 DIAGNOSIS — F0281 Dementia in other diseases classified elsewhere with behavioral disturbance: Secondary | ICD-10-CM | POA: Diagnosis not present

## 2016-07-03 DIAGNOSIS — R630 Anorexia: Secondary | ICD-10-CM | POA: Diagnosis not present

## 2016-07-04 DIAGNOSIS — I10 Essential (primary) hypertension: Secondary | ICD-10-CM | POA: Diagnosis not present

## 2016-07-04 DIAGNOSIS — I5030 Unspecified diastolic (congestive) heart failure: Secondary | ICD-10-CM | POA: Diagnosis not present

## 2016-07-04 DIAGNOSIS — R630 Anorexia: Secondary | ICD-10-CM | POA: Diagnosis not present

## 2016-07-04 DIAGNOSIS — F0281 Dementia in other diseases classified elsewhere with behavioral disturbance: Secondary | ICD-10-CM | POA: Diagnosis not present

## 2016-07-04 DIAGNOSIS — G3183 Dementia with Lewy bodies: Secondary | ICD-10-CM | POA: Diagnosis not present

## 2016-07-04 DIAGNOSIS — K219 Gastro-esophageal reflux disease without esophagitis: Secondary | ICD-10-CM | POA: Diagnosis not present

## 2016-07-06 DIAGNOSIS — F339 Major depressive disorder, recurrent, unspecified: Secondary | ICD-10-CM | POA: Diagnosis not present

## 2016-07-06 DIAGNOSIS — G3183 Dementia with Lewy bodies: Secondary | ICD-10-CM | POA: Diagnosis not present

## 2016-07-06 DIAGNOSIS — N3289 Other specified disorders of bladder: Secondary | ICD-10-CM | POA: Diagnosis not present

## 2016-07-06 DIAGNOSIS — E785 Hyperlipidemia, unspecified: Secondary | ICD-10-CM | POA: Diagnosis not present

## 2016-07-06 DIAGNOSIS — K219 Gastro-esophageal reflux disease without esophagitis: Secondary | ICD-10-CM | POA: Diagnosis not present

## 2016-07-06 DIAGNOSIS — F0281 Dementia in other diseases classified elsewhere with behavioral disturbance: Secondary | ICD-10-CM | POA: Diagnosis not present

## 2016-07-06 DIAGNOSIS — I10 Essential (primary) hypertension: Secondary | ICD-10-CM | POA: Diagnosis not present

## 2016-07-06 DIAGNOSIS — R630 Anorexia: Secondary | ICD-10-CM | POA: Diagnosis not present

## 2016-07-06 DIAGNOSIS — I5030 Unspecified diastolic (congestive) heart failure: Secondary | ICD-10-CM | POA: Diagnosis not present

## 2016-07-06 DIAGNOSIS — I672 Cerebral atherosclerosis: Secondary | ICD-10-CM | POA: Diagnosis not present

## 2016-07-06 DIAGNOSIS — G20 Parkinson's disease: Secondary | ICD-10-CM | POA: Diagnosis not present

## 2016-07-10 ENCOUNTER — Other Ambulatory Visit: Payer: Self-pay | Admitting: *Deleted

## 2016-07-10 ENCOUNTER — Other Ambulatory Visit: Payer: Self-pay | Admitting: Internal Medicine

## 2016-07-10 MED ORDER — OMEGA-3-ACID ETHYL ESTERS 1 G PO CAPS: ORAL_CAPSULE | ORAL | 3 refills | Status: AC

## 2016-07-10 NOTE — Telephone Encounter (Signed)
[facility] °

## 2016-08-06 DIAGNOSIS — G3183 Dementia with Lewy bodies: Secondary | ICD-10-CM | POA: Diagnosis not present

## 2016-08-06 DIAGNOSIS — F0281 Dementia in other diseases classified elsewhere with behavioral disturbance: Secondary | ICD-10-CM | POA: Diagnosis not present

## 2016-08-06 DIAGNOSIS — I5030 Unspecified diastolic (congestive) heart failure: Secondary | ICD-10-CM | POA: Diagnosis not present

## 2016-08-06 DIAGNOSIS — I10 Essential (primary) hypertension: Secondary | ICD-10-CM | POA: Diagnosis not present

## 2016-08-06 DIAGNOSIS — I672 Cerebral atherosclerosis: Secondary | ICD-10-CM | POA: Diagnosis not present

## 2016-08-06 DIAGNOSIS — K219 Gastro-esophageal reflux disease without esophagitis: Secondary | ICD-10-CM | POA: Diagnosis not present

## 2016-08-06 DIAGNOSIS — N3289 Other specified disorders of bladder: Secondary | ICD-10-CM | POA: Diagnosis not present

## 2016-08-06 DIAGNOSIS — G20 Parkinson's disease: Secondary | ICD-10-CM | POA: Diagnosis not present

## 2016-08-06 DIAGNOSIS — R630 Anorexia: Secondary | ICD-10-CM | POA: Diagnosis not present

## 2016-08-06 DIAGNOSIS — F339 Major depressive disorder, recurrent, unspecified: Secondary | ICD-10-CM | POA: Diagnosis not present

## 2016-08-06 DIAGNOSIS — E785 Hyperlipidemia, unspecified: Secondary | ICD-10-CM | POA: Diagnosis not present

## 2016-09-05 DIAGNOSIS — G3183 Dementia with Lewy bodies: Secondary | ICD-10-CM | POA: Diagnosis not present

## 2016-09-05 DIAGNOSIS — I5030 Unspecified diastolic (congestive) heart failure: Secondary | ICD-10-CM | POA: Diagnosis not present

## 2016-09-05 DIAGNOSIS — R630 Anorexia: Secondary | ICD-10-CM | POA: Diagnosis not present

## 2016-09-05 DIAGNOSIS — G20 Parkinson's disease: Secondary | ICD-10-CM | POA: Diagnosis not present

## 2016-09-05 DIAGNOSIS — I672 Cerebral atherosclerosis: Secondary | ICD-10-CM | POA: Diagnosis not present

## 2016-09-05 DIAGNOSIS — E785 Hyperlipidemia, unspecified: Secondary | ICD-10-CM | POA: Diagnosis not present

## 2016-09-05 DIAGNOSIS — I10 Essential (primary) hypertension: Secondary | ICD-10-CM | POA: Diagnosis not present

## 2016-09-05 DIAGNOSIS — N3289 Other specified disorders of bladder: Secondary | ICD-10-CM | POA: Diagnosis not present

## 2016-09-05 DIAGNOSIS — F0281 Dementia in other diseases classified elsewhere with behavioral disturbance: Secondary | ICD-10-CM | POA: Diagnosis not present

## 2016-09-05 DIAGNOSIS — F339 Major depressive disorder, recurrent, unspecified: Secondary | ICD-10-CM | POA: Diagnosis not present

## 2016-09-05 DIAGNOSIS — K219 Gastro-esophageal reflux disease without esophagitis: Secondary | ICD-10-CM | POA: Diagnosis not present

## 2016-09-19 ENCOUNTER — Other Ambulatory Visit: Payer: Self-pay | Admitting: Internal Medicine

## 2016-09-19 DIAGNOSIS — K219 Gastro-esophageal reflux disease without esophagitis: Secondary | ICD-10-CM

## 2016-09-25 ENCOUNTER — Other Ambulatory Visit: Payer: Self-pay | Admitting: *Deleted

## 2016-09-25 ENCOUNTER — Other Ambulatory Visit: Payer: Self-pay | Admitting: Internal Medicine

## 2016-09-25 MED ORDER — EXELON 4.6 MG/24HR TD PT24: MEDICATED_PATCH | TRANSDERMAL | 3 refills | Status: AC

## 2016-09-25 NOTE — Telephone Encounter (Signed)
[facility]--states [facility]. Needs Rx to say DAW

## 2016-09-26 ENCOUNTER — Telehealth: Payer: Self-pay | Admitting: Internal Medicine

## 2016-09-26 NOTE — Telephone Encounter (Signed)
left msg asking pt to call and schedule AWV that's due now. VDM (DD)

## 2016-10-06 DIAGNOSIS — G3183 Dementia with Lewy bodies: Secondary | ICD-10-CM | POA: Diagnosis not present

## 2016-10-06 DIAGNOSIS — I10 Essential (primary) hypertension: Secondary | ICD-10-CM | POA: Diagnosis not present

## 2016-10-06 DIAGNOSIS — K219 Gastro-esophageal reflux disease without esophagitis: Secondary | ICD-10-CM | POA: Diagnosis not present

## 2016-10-06 DIAGNOSIS — G20 Parkinson's disease: Secondary | ICD-10-CM | POA: Diagnosis not present

## 2016-10-06 DIAGNOSIS — R630 Anorexia: Secondary | ICD-10-CM | POA: Diagnosis not present

## 2016-10-06 DIAGNOSIS — F339 Major depressive disorder, recurrent, unspecified: Secondary | ICD-10-CM | POA: Diagnosis not present

## 2016-10-06 DIAGNOSIS — E785 Hyperlipidemia, unspecified: Secondary | ICD-10-CM | POA: Diagnosis not present

## 2016-10-06 DIAGNOSIS — I5030 Unspecified diastolic (congestive) heart failure: Secondary | ICD-10-CM | POA: Diagnosis not present

## 2016-10-06 DIAGNOSIS — N3289 Other specified disorders of bladder: Secondary | ICD-10-CM | POA: Diagnosis not present

## 2016-10-06 DIAGNOSIS — I672 Cerebral atherosclerosis: Secondary | ICD-10-CM | POA: Diagnosis not present

## 2016-10-06 DIAGNOSIS — F0281 Dementia in other diseases classified elsewhere with behavioral disturbance: Secondary | ICD-10-CM | POA: Diagnosis not present

## 2016-10-08 DIAGNOSIS — I5030 Unspecified diastolic (congestive) heart failure: Secondary | ICD-10-CM | POA: Diagnosis not present

## 2016-10-08 DIAGNOSIS — I10 Essential (primary) hypertension: Secondary | ICD-10-CM | POA: Diagnosis not present

## 2016-10-08 DIAGNOSIS — F0281 Dementia in other diseases classified elsewhere with behavioral disturbance: Secondary | ICD-10-CM | POA: Diagnosis not present

## 2016-10-08 DIAGNOSIS — G3183 Dementia with Lewy bodies: Secondary | ICD-10-CM | POA: Diagnosis not present

## 2016-10-08 DIAGNOSIS — R630 Anorexia: Secondary | ICD-10-CM | POA: Diagnosis not present

## 2016-10-08 DIAGNOSIS — K219 Gastro-esophageal reflux disease without esophagitis: Secondary | ICD-10-CM | POA: Diagnosis not present

## 2016-10-09 DIAGNOSIS — I10 Essential (primary) hypertension: Secondary | ICD-10-CM | POA: Diagnosis not present

## 2016-10-09 DIAGNOSIS — R630 Anorexia: Secondary | ICD-10-CM | POA: Diagnosis not present

## 2016-10-09 DIAGNOSIS — K219 Gastro-esophageal reflux disease without esophagitis: Secondary | ICD-10-CM | POA: Diagnosis not present

## 2016-10-09 DIAGNOSIS — I5030 Unspecified diastolic (congestive) heart failure: Secondary | ICD-10-CM | POA: Diagnosis not present

## 2016-10-09 DIAGNOSIS — G3183 Dementia with Lewy bodies: Secondary | ICD-10-CM | POA: Diagnosis not present

## 2016-10-09 DIAGNOSIS — F0281 Dementia in other diseases classified elsewhere with behavioral disturbance: Secondary | ICD-10-CM | POA: Diagnosis not present

## 2016-10-10 DIAGNOSIS — G3183 Dementia with Lewy bodies: Secondary | ICD-10-CM | POA: Diagnosis not present

## 2016-10-10 DIAGNOSIS — R630 Anorexia: Secondary | ICD-10-CM | POA: Diagnosis not present

## 2016-10-10 DIAGNOSIS — K219 Gastro-esophageal reflux disease without esophagitis: Secondary | ICD-10-CM | POA: Diagnosis not present

## 2016-10-10 DIAGNOSIS — F0281 Dementia in other diseases classified elsewhere with behavioral disturbance: Secondary | ICD-10-CM | POA: Diagnosis not present

## 2016-10-10 DIAGNOSIS — I10 Essential (primary) hypertension: Secondary | ICD-10-CM | POA: Diagnosis not present

## 2016-10-10 DIAGNOSIS — I5030 Unspecified diastolic (congestive) heart failure: Secondary | ICD-10-CM | POA: Diagnosis not present

## 2016-10-11 ENCOUNTER — Other Ambulatory Visit: Payer: Self-pay | Admitting: Internal Medicine

## 2016-10-12 DIAGNOSIS — F0281 Dementia in other diseases classified elsewhere with behavioral disturbance: Secondary | ICD-10-CM | POA: Diagnosis not present

## 2016-10-12 DIAGNOSIS — I5030 Unspecified diastolic (congestive) heart failure: Secondary | ICD-10-CM | POA: Diagnosis not present

## 2016-10-12 DIAGNOSIS — R630 Anorexia: Secondary | ICD-10-CM | POA: Diagnosis not present

## 2016-10-12 DIAGNOSIS — G3183 Dementia with Lewy bodies: Secondary | ICD-10-CM | POA: Diagnosis not present

## 2016-10-12 DIAGNOSIS — I10 Essential (primary) hypertension: Secondary | ICD-10-CM | POA: Diagnosis not present

## 2016-10-12 DIAGNOSIS — K219 Gastro-esophageal reflux disease without esophagitis: Secondary | ICD-10-CM | POA: Diagnosis not present

## 2016-10-13 ENCOUNTER — Other Ambulatory Visit: Payer: Self-pay | Admitting: Internal Medicine

## 2016-10-13 ENCOUNTER — Telehealth: Payer: Self-pay | Admitting: *Deleted

## 2016-10-13 DIAGNOSIS — R630 Anorexia: Secondary | ICD-10-CM | POA: Diagnosis not present

## 2016-10-13 DIAGNOSIS — G3183 Dementia with Lewy bodies: Secondary | ICD-10-CM | POA: Diagnosis not present

## 2016-10-13 DIAGNOSIS — J690 Pneumonitis due to inhalation of food and vomit: Secondary | ICD-10-CM

## 2016-10-13 DIAGNOSIS — K219 Gastro-esophageal reflux disease without esophagitis: Secondary | ICD-10-CM | POA: Diagnosis not present

## 2016-10-13 DIAGNOSIS — I10 Essential (primary) hypertension: Secondary | ICD-10-CM | POA: Diagnosis not present

## 2016-10-13 DIAGNOSIS — I5030 Unspecified diastolic (congestive) heart failure: Secondary | ICD-10-CM | POA: Diagnosis not present

## 2016-10-13 DIAGNOSIS — F0281 Dementia in other diseases classified elsewhere with behavioral disturbance: Secondary | ICD-10-CM | POA: Diagnosis not present

## 2016-10-13 MED ORDER — AMOXICILLIN-POT CLAVULANATE 875-125 MG PO TABS: 1.0000 | ORAL_TABLET | Freq: Two times a day (BID) | ORAL | 0 refills | Status: AC

## 2016-10-13 MED ORDER — LORAZEPAM 2 MG/ML PO CONC: 1.0000 mg | Freq: Four times a day (QID) | ORAL | 3 refills | Status: AC | PRN

## 2016-10-13 MED ORDER — INFLUENZA VAC SPLIT QUAD 0.5 ML IM SUSP: 0.5000 mL | Freq: Once | INTRAMUSCULAR | 0 refills | Status: AC

## 2016-10-13 MED ORDER — MIRABEGRON ER 50 MG PO TB24: ORAL_TABLET | ORAL | 3 refills | Status: DC

## 2016-10-13 MED ORDER — FOOD THICKENER (THICKENUP CLEAR): 1.0000 g | ORAL | 11 refills | Status: AC | PRN

## 2016-10-13 NOTE — Telephone Encounter (Signed)
Enid SkeensLois Bore with Hospice called and stated that patient has wheezing throughout the lung field and thinking it is Aspiration Pneumonia and would like something called in for it. Also stated that patient may need something for her breathing/anxiety like liquid Morphine or Ativan.   Daughter, Selena BattenKim called and stated that she would like the Aspiration Pneumonia treated but she wasn't sure about the Morphine or Ativan due to patient's sensitivity to medications. She does not want to "knock her out" but may would like something on hand just in case.   2. Was wondering if patient would benefit from slightly thickening her liquids. Wonders if Rx could be called in for Nectar to thicken.   3. Wants a Rx faxed to pharmacy for flu vaccine for when patient is better the Hospice nurse can give it to her. Due to people being in and out of her home.   4. Would like a Rx for Myrbetriq faxed to pharmacy.    Selena BattenKim (daughter) [phone] or [phone] cell

## 2016-10-13 NOTE — Telephone Encounter (Signed)
Ativan concentrate ordered Thickener ordered Flu vaccine ordered mybetriq signed and sent augmentin sent I believe all that were sent electronically went to [facility]; printed the ativan and thickener to be [facility]ed by Tuesday [facility] depending on Kim's preference.    Holly Grimes L. Ameerah Huffstetler, D.O. Geriatrics [facility] [address] [place], [place] [address] Cell Phone (Mon-Fri 8am-5pm):  [phone] On Call:  [phone] & follow prompts after 5pm & weekends Office Phone:  [phone] Office Fax:  [phone]

## 2016-10-13 NOTE — Telephone Encounter (Signed)
Rxs were addressed through hospice nurse today.  It is very hard to get her in here and she's currently having aspiration pneumonia.

## 2016-10-14 ENCOUNTER — Other Ambulatory Visit: Payer: Self-pay | Admitting: *Deleted

## 2016-10-15 DIAGNOSIS — G3183 Dementia with Lewy bodies: Secondary | ICD-10-CM | POA: Diagnosis not present

## 2016-10-15 DIAGNOSIS — K219 Gastro-esophageal reflux disease without esophagitis: Secondary | ICD-10-CM | POA: Diagnosis not present

## 2016-10-15 DIAGNOSIS — R630 Anorexia: Secondary | ICD-10-CM | POA: Diagnosis not present

## 2016-10-15 DIAGNOSIS — F0281 Dementia in other diseases classified elsewhere with behavioral disturbance: Secondary | ICD-10-CM | POA: Diagnosis not present

## 2016-10-15 DIAGNOSIS — I5030 Unspecified diastolic (congestive) heart failure: Secondary | ICD-10-CM | POA: Diagnosis not present

## 2016-10-15 DIAGNOSIS — I10 Essential (primary) hypertension: Secondary | ICD-10-CM | POA: Diagnosis not present

## 2016-10-16 DIAGNOSIS — I5030 Unspecified diastolic (congestive) heart failure: Secondary | ICD-10-CM | POA: Diagnosis not present

## 2016-10-16 DIAGNOSIS — I10 Essential (primary) hypertension: Secondary | ICD-10-CM | POA: Diagnosis not present

## 2016-10-16 DIAGNOSIS — G3183 Dementia with Lewy bodies: Secondary | ICD-10-CM | POA: Diagnosis not present

## 2016-10-16 DIAGNOSIS — F0281 Dementia in other diseases classified elsewhere with behavioral disturbance: Secondary | ICD-10-CM | POA: Diagnosis not present

## 2016-10-16 DIAGNOSIS — K219 Gastro-esophageal reflux disease without esophagitis: Secondary | ICD-10-CM | POA: Diagnosis not present

## 2016-10-16 DIAGNOSIS — R630 Anorexia: Secondary | ICD-10-CM | POA: Diagnosis not present

## 2016-10-17 DIAGNOSIS — G3183 Dementia with Lewy bodies: Secondary | ICD-10-CM | POA: Diagnosis not present

## 2016-10-17 DIAGNOSIS — I10 Essential (primary) hypertension: Secondary | ICD-10-CM | POA: Diagnosis not present

## 2016-10-17 DIAGNOSIS — I5030 Unspecified diastolic (congestive) heart failure: Secondary | ICD-10-CM | POA: Diagnosis not present

## 2016-10-17 DIAGNOSIS — K219 Gastro-esophageal reflux disease without esophagitis: Secondary | ICD-10-CM | POA: Diagnosis not present

## 2016-10-17 DIAGNOSIS — F0281 Dementia in other diseases classified elsewhere with behavioral disturbance: Secondary | ICD-10-CM | POA: Diagnosis not present

## 2016-10-17 DIAGNOSIS — R630 Anorexia: Secondary | ICD-10-CM | POA: Diagnosis not present

## 2016-10-20 DIAGNOSIS — I5030 Unspecified diastolic (congestive) heart failure: Secondary | ICD-10-CM | POA: Diagnosis not present

## 2016-10-20 DIAGNOSIS — F0281 Dementia in other diseases classified elsewhere with behavioral disturbance: Secondary | ICD-10-CM | POA: Diagnosis not present

## 2016-10-20 DIAGNOSIS — I10 Essential (primary) hypertension: Secondary | ICD-10-CM | POA: Diagnosis not present

## 2016-10-20 DIAGNOSIS — R630 Anorexia: Secondary | ICD-10-CM | POA: Diagnosis not present

## 2016-10-20 DIAGNOSIS — K219 Gastro-esophageal reflux disease without esophagitis: Secondary | ICD-10-CM | POA: Diagnosis not present

## 2016-10-20 DIAGNOSIS — G3183 Dementia with Lewy bodies: Secondary | ICD-10-CM | POA: Diagnosis not present

## 2016-10-21 DIAGNOSIS — R630 Anorexia: Secondary | ICD-10-CM | POA: Diagnosis not present

## 2016-10-21 DIAGNOSIS — G3183 Dementia with Lewy bodies: Secondary | ICD-10-CM | POA: Diagnosis not present

## 2016-10-21 DIAGNOSIS — I10 Essential (primary) hypertension: Secondary | ICD-10-CM | POA: Diagnosis not present

## 2016-10-21 DIAGNOSIS — F0281 Dementia in other diseases classified elsewhere with behavioral disturbance: Secondary | ICD-10-CM | POA: Diagnosis not present

## 2016-10-21 DIAGNOSIS — I5030 Unspecified diastolic (congestive) heart failure: Secondary | ICD-10-CM | POA: Diagnosis not present

## 2016-10-21 DIAGNOSIS — K219 Gastro-esophageal reflux disease without esophagitis: Secondary | ICD-10-CM | POA: Diagnosis not present

## 2016-10-23 DIAGNOSIS — I5030 Unspecified diastolic (congestive) heart failure: Secondary | ICD-10-CM | POA: Diagnosis not present

## 2016-10-23 DIAGNOSIS — I10 Essential (primary) hypertension: Secondary | ICD-10-CM | POA: Diagnosis not present

## 2016-10-23 DIAGNOSIS — R630 Anorexia: Secondary | ICD-10-CM | POA: Diagnosis not present

## 2016-10-23 DIAGNOSIS — F0281 Dementia in other diseases classified elsewhere with behavioral disturbance: Secondary | ICD-10-CM | POA: Diagnosis not present

## 2016-10-23 DIAGNOSIS — K219 Gastro-esophageal reflux disease without esophagitis: Secondary | ICD-10-CM | POA: Diagnosis not present

## 2016-10-23 DIAGNOSIS — G3183 Dementia with Lewy bodies: Secondary | ICD-10-CM | POA: Diagnosis not present

## 2016-10-24 DIAGNOSIS — I5030 Unspecified diastolic (congestive) heart failure: Secondary | ICD-10-CM | POA: Diagnosis not present

## 2016-10-24 DIAGNOSIS — F0281 Dementia in other diseases classified elsewhere with behavioral disturbance: Secondary | ICD-10-CM | POA: Diagnosis not present

## 2016-10-24 DIAGNOSIS — K219 Gastro-esophageal reflux disease without esophagitis: Secondary | ICD-10-CM | POA: Diagnosis not present

## 2016-10-24 DIAGNOSIS — R630 Anorexia: Secondary | ICD-10-CM | POA: Diagnosis not present

## 2016-10-24 DIAGNOSIS — G3183 Dementia with Lewy bodies: Secondary | ICD-10-CM | POA: Diagnosis not present

## 2016-10-24 DIAGNOSIS — I10 Essential (primary) hypertension: Secondary | ICD-10-CM | POA: Diagnosis not present

## 2016-10-27 DIAGNOSIS — F0281 Dementia in other diseases classified elsewhere with behavioral disturbance: Secondary | ICD-10-CM | POA: Diagnosis not present

## 2016-10-27 DIAGNOSIS — R630 Anorexia: Secondary | ICD-10-CM | POA: Diagnosis not present

## 2016-10-27 DIAGNOSIS — I10 Essential (primary) hypertension: Secondary | ICD-10-CM | POA: Diagnosis not present

## 2016-10-27 DIAGNOSIS — I5030 Unspecified diastolic (congestive) heart failure: Secondary | ICD-10-CM | POA: Diagnosis not present

## 2016-10-27 DIAGNOSIS — G3183 Dementia with Lewy bodies: Secondary | ICD-10-CM | POA: Diagnosis not present

## 2016-10-27 DIAGNOSIS — K219 Gastro-esophageal reflux disease without esophagitis: Secondary | ICD-10-CM | POA: Diagnosis not present

## 2016-10-29 DIAGNOSIS — K219 Gastro-esophageal reflux disease without esophagitis: Secondary | ICD-10-CM | POA: Diagnosis not present

## 2016-10-29 DIAGNOSIS — G3183 Dementia with Lewy bodies: Secondary | ICD-10-CM | POA: Diagnosis not present

## 2016-10-29 DIAGNOSIS — R630 Anorexia: Secondary | ICD-10-CM | POA: Diagnosis not present

## 2016-10-29 DIAGNOSIS — I10 Essential (primary) hypertension: Secondary | ICD-10-CM | POA: Diagnosis not present

## 2016-10-29 DIAGNOSIS — F0281 Dementia in other diseases classified elsewhere with behavioral disturbance: Secondary | ICD-10-CM | POA: Diagnosis not present

## 2016-10-29 DIAGNOSIS — I5030 Unspecified diastolic (congestive) heart failure: Secondary | ICD-10-CM | POA: Diagnosis not present

## 2016-10-30 DIAGNOSIS — R630 Anorexia: Secondary | ICD-10-CM | POA: Diagnosis not present

## 2016-10-30 DIAGNOSIS — G3183 Dementia with Lewy bodies: Secondary | ICD-10-CM | POA: Diagnosis not present

## 2016-10-30 DIAGNOSIS — F0281 Dementia in other diseases classified elsewhere with behavioral disturbance: Secondary | ICD-10-CM | POA: Diagnosis not present

## 2016-10-30 DIAGNOSIS — I10 Essential (primary) hypertension: Secondary | ICD-10-CM | POA: Diagnosis not present

## 2016-10-30 DIAGNOSIS — I5030 Unspecified diastolic (congestive) heart failure: Secondary | ICD-10-CM | POA: Diagnosis not present

## 2016-10-30 DIAGNOSIS — K219 Gastro-esophageal reflux disease without esophagitis: Secondary | ICD-10-CM | POA: Diagnosis not present

## 2016-10-31 DIAGNOSIS — I5030 Unspecified diastolic (congestive) heart failure: Secondary | ICD-10-CM | POA: Diagnosis not present

## 2016-10-31 DIAGNOSIS — F0281 Dementia in other diseases classified elsewhere with behavioral disturbance: Secondary | ICD-10-CM | POA: Diagnosis not present

## 2016-10-31 DIAGNOSIS — G3183 Dementia with Lewy bodies: Secondary | ICD-10-CM | POA: Diagnosis not present

## 2016-10-31 DIAGNOSIS — I10 Essential (primary) hypertension: Secondary | ICD-10-CM | POA: Diagnosis not present

## 2016-10-31 DIAGNOSIS — R630 Anorexia: Secondary | ICD-10-CM | POA: Diagnosis not present

## 2016-10-31 DIAGNOSIS — K219 Gastro-esophageal reflux disease without esophagitis: Secondary | ICD-10-CM | POA: Diagnosis not present

## 2016-11-03 DIAGNOSIS — F0281 Dementia in other diseases classified elsewhere with behavioral disturbance: Secondary | ICD-10-CM | POA: Diagnosis not present

## 2016-11-03 DIAGNOSIS — K219 Gastro-esophageal reflux disease without esophagitis: Secondary | ICD-10-CM | POA: Diagnosis not present

## 2016-11-03 DIAGNOSIS — I5030 Unspecified diastolic (congestive) heart failure: Secondary | ICD-10-CM | POA: Diagnosis not present

## 2016-11-03 DIAGNOSIS — I10 Essential (primary) hypertension: Secondary | ICD-10-CM | POA: Diagnosis not present

## 2016-11-03 DIAGNOSIS — G3183 Dementia with Lewy bodies: Secondary | ICD-10-CM | POA: Diagnosis not present

## 2016-11-03 DIAGNOSIS — R630 Anorexia: Secondary | ICD-10-CM | POA: Diagnosis not present

## 2016-11-05 DIAGNOSIS — R630 Anorexia: Secondary | ICD-10-CM | POA: Diagnosis not present

## 2016-11-05 DIAGNOSIS — I5030 Unspecified diastolic (congestive) heart failure: Secondary | ICD-10-CM | POA: Diagnosis not present

## 2016-11-05 DIAGNOSIS — G3183 Dementia with Lewy bodies: Secondary | ICD-10-CM | POA: Diagnosis not present

## 2016-11-05 DIAGNOSIS — I10 Essential (primary) hypertension: Secondary | ICD-10-CM | POA: Diagnosis not present

## 2016-11-05 DIAGNOSIS — K219 Gastro-esophageal reflux disease without esophagitis: Secondary | ICD-10-CM | POA: Diagnosis not present

## 2016-11-05 DIAGNOSIS — F0281 Dementia in other diseases classified elsewhere with behavioral disturbance: Secondary | ICD-10-CM | POA: Diagnosis not present

## 2016-11-06 DIAGNOSIS — G20 Parkinson's disease: Secondary | ICD-10-CM | POA: Diagnosis not present

## 2016-11-06 DIAGNOSIS — F0281 Dementia in other diseases classified elsewhere with behavioral disturbance: Secondary | ICD-10-CM | POA: Diagnosis not present

## 2016-11-06 DIAGNOSIS — F339 Major depressive disorder, recurrent, unspecified: Secondary | ICD-10-CM | POA: Diagnosis not present

## 2016-11-06 DIAGNOSIS — I672 Cerebral atherosclerosis: Secondary | ICD-10-CM | POA: Diagnosis not present

## 2016-11-06 DIAGNOSIS — G3183 Dementia with Lewy bodies: Secondary | ICD-10-CM | POA: Diagnosis not present

## 2016-11-06 DIAGNOSIS — K219 Gastro-esophageal reflux disease without esophagitis: Secondary | ICD-10-CM | POA: Diagnosis not present

## 2016-11-06 DIAGNOSIS — N3289 Other specified disorders of bladder: Secondary | ICD-10-CM | POA: Diagnosis not present

## 2016-11-06 DIAGNOSIS — I10 Essential (primary) hypertension: Secondary | ICD-10-CM | POA: Diagnosis not present

## 2016-11-06 DIAGNOSIS — R630 Anorexia: Secondary | ICD-10-CM | POA: Diagnosis not present

## 2016-11-06 DIAGNOSIS — I5030 Unspecified diastolic (congestive) heart failure: Secondary | ICD-10-CM | POA: Diagnosis not present

## 2016-11-06 DIAGNOSIS — E785 Hyperlipidemia, unspecified: Secondary | ICD-10-CM | POA: Diagnosis not present

## 2016-11-07 DIAGNOSIS — F0281 Dementia in other diseases classified elsewhere with behavioral disturbance: Secondary | ICD-10-CM | POA: Diagnosis not present

## 2016-11-07 DIAGNOSIS — R630 Anorexia: Secondary | ICD-10-CM | POA: Diagnosis not present

## 2016-11-07 DIAGNOSIS — I10 Essential (primary) hypertension: Secondary | ICD-10-CM | POA: Diagnosis not present

## 2016-11-07 DIAGNOSIS — G3183 Dementia with Lewy bodies: Secondary | ICD-10-CM | POA: Diagnosis not present

## 2016-11-07 DIAGNOSIS — K219 Gastro-esophageal reflux disease without esophagitis: Secondary | ICD-10-CM | POA: Diagnosis not present

## 2016-11-07 DIAGNOSIS — I5030 Unspecified diastolic (congestive) heart failure: Secondary | ICD-10-CM | POA: Diagnosis not present

## 2016-11-10 DIAGNOSIS — I10 Essential (primary) hypertension: Secondary | ICD-10-CM | POA: Diagnosis not present

## 2016-11-10 DIAGNOSIS — F0281 Dementia in other diseases classified elsewhere with behavioral disturbance: Secondary | ICD-10-CM | POA: Diagnosis not present

## 2016-11-10 DIAGNOSIS — K219 Gastro-esophageal reflux disease without esophagitis: Secondary | ICD-10-CM | POA: Diagnosis not present

## 2016-11-10 DIAGNOSIS — I5030 Unspecified diastolic (congestive) heart failure: Secondary | ICD-10-CM | POA: Diagnosis not present

## 2016-11-10 DIAGNOSIS — R630 Anorexia: Secondary | ICD-10-CM | POA: Diagnosis not present

## 2016-11-10 DIAGNOSIS — G3183 Dementia with Lewy bodies: Secondary | ICD-10-CM | POA: Diagnosis not present

## 2016-11-12 DIAGNOSIS — F0281 Dementia in other diseases classified elsewhere with behavioral disturbance: Secondary | ICD-10-CM | POA: Diagnosis not present

## 2016-11-12 DIAGNOSIS — R630 Anorexia: Secondary | ICD-10-CM | POA: Diagnosis not present

## 2016-11-12 DIAGNOSIS — G3183 Dementia with Lewy bodies: Secondary | ICD-10-CM | POA: Diagnosis not present

## 2016-11-12 DIAGNOSIS — I10 Essential (primary) hypertension: Secondary | ICD-10-CM | POA: Diagnosis not present

## 2016-11-12 DIAGNOSIS — I5030 Unspecified diastolic (congestive) heart failure: Secondary | ICD-10-CM | POA: Diagnosis not present

## 2016-11-12 DIAGNOSIS — K219 Gastro-esophageal reflux disease without esophagitis: Secondary | ICD-10-CM | POA: Diagnosis not present

## 2016-11-13 DIAGNOSIS — G3183 Dementia with Lewy bodies: Secondary | ICD-10-CM | POA: Diagnosis not present

## 2016-11-13 DIAGNOSIS — K219 Gastro-esophageal reflux disease without esophagitis: Secondary | ICD-10-CM | POA: Diagnosis not present

## 2016-11-13 DIAGNOSIS — F0281 Dementia in other diseases classified elsewhere with behavioral disturbance: Secondary | ICD-10-CM | POA: Diagnosis not present

## 2016-11-13 DIAGNOSIS — I10 Essential (primary) hypertension: Secondary | ICD-10-CM | POA: Diagnosis not present

## 2016-11-13 DIAGNOSIS — R630 Anorexia: Secondary | ICD-10-CM | POA: Diagnosis not present

## 2016-11-13 DIAGNOSIS — I5030 Unspecified diastolic (congestive) heart failure: Secondary | ICD-10-CM | POA: Diagnosis not present

## 2016-11-14 DIAGNOSIS — G3183 Dementia with Lewy bodies: Secondary | ICD-10-CM | POA: Diagnosis not present

## 2016-11-14 DIAGNOSIS — K219 Gastro-esophageal reflux disease without esophagitis: Secondary | ICD-10-CM | POA: Diagnosis not present

## 2016-11-14 DIAGNOSIS — I10 Essential (primary) hypertension: Secondary | ICD-10-CM | POA: Diagnosis not present

## 2016-11-14 DIAGNOSIS — I5030 Unspecified diastolic (congestive) heart failure: Secondary | ICD-10-CM | POA: Diagnosis not present

## 2016-11-14 DIAGNOSIS — F0281 Dementia in other diseases classified elsewhere with behavioral disturbance: Secondary | ICD-10-CM | POA: Diagnosis not present

## 2016-11-14 DIAGNOSIS — R630 Anorexia: Secondary | ICD-10-CM | POA: Diagnosis not present

## 2016-11-17 DIAGNOSIS — R630 Anorexia: Secondary | ICD-10-CM | POA: Diagnosis not present

## 2016-11-17 DIAGNOSIS — F0281 Dementia in other diseases classified elsewhere with behavioral disturbance: Secondary | ICD-10-CM | POA: Diagnosis not present

## 2016-11-17 DIAGNOSIS — I10 Essential (primary) hypertension: Secondary | ICD-10-CM | POA: Diagnosis not present

## 2016-11-17 DIAGNOSIS — K219 Gastro-esophageal reflux disease without esophagitis: Secondary | ICD-10-CM | POA: Diagnosis not present

## 2016-11-17 DIAGNOSIS — G3183 Dementia with Lewy bodies: Secondary | ICD-10-CM | POA: Diagnosis not present

## 2016-11-17 DIAGNOSIS — I5030 Unspecified diastolic (congestive) heart failure: Secondary | ICD-10-CM | POA: Diagnosis not present

## 2016-11-19 DIAGNOSIS — I5030 Unspecified diastolic (congestive) heart failure: Secondary | ICD-10-CM | POA: Diagnosis not present

## 2016-11-19 DIAGNOSIS — F0281 Dementia in other diseases classified elsewhere with behavioral disturbance: Secondary | ICD-10-CM | POA: Diagnosis not present

## 2016-11-19 DIAGNOSIS — G3183 Dementia with Lewy bodies: Secondary | ICD-10-CM | POA: Diagnosis not present

## 2016-11-19 DIAGNOSIS — K219 Gastro-esophageal reflux disease without esophagitis: Secondary | ICD-10-CM | POA: Diagnosis not present

## 2016-11-19 DIAGNOSIS — I10 Essential (primary) hypertension: Secondary | ICD-10-CM | POA: Diagnosis not present

## 2016-11-19 DIAGNOSIS — R630 Anorexia: Secondary | ICD-10-CM | POA: Diagnosis not present

## 2016-11-20 DIAGNOSIS — I5030 Unspecified diastolic (congestive) heart failure: Secondary | ICD-10-CM | POA: Diagnosis not present

## 2016-11-20 DIAGNOSIS — F0281 Dementia in other diseases classified elsewhere with behavioral disturbance: Secondary | ICD-10-CM | POA: Diagnosis not present

## 2016-11-20 DIAGNOSIS — G3183 Dementia with Lewy bodies: Secondary | ICD-10-CM | POA: Diagnosis not present

## 2016-11-20 DIAGNOSIS — K219 Gastro-esophageal reflux disease without esophagitis: Secondary | ICD-10-CM | POA: Diagnosis not present

## 2016-11-20 DIAGNOSIS — I10 Essential (primary) hypertension: Secondary | ICD-10-CM | POA: Diagnosis not present

## 2016-11-20 DIAGNOSIS — R630 Anorexia: Secondary | ICD-10-CM | POA: Diagnosis not present

## 2016-11-21 DIAGNOSIS — I5030 Unspecified diastolic (congestive) heart failure: Secondary | ICD-10-CM | POA: Diagnosis not present

## 2016-11-21 DIAGNOSIS — G3183 Dementia with Lewy bodies: Secondary | ICD-10-CM | POA: Diagnosis not present

## 2016-11-21 DIAGNOSIS — R630 Anorexia: Secondary | ICD-10-CM | POA: Diagnosis not present

## 2016-11-21 DIAGNOSIS — F0281 Dementia in other diseases classified elsewhere with behavioral disturbance: Secondary | ICD-10-CM | POA: Diagnosis not present

## 2016-11-21 DIAGNOSIS — K219 Gastro-esophageal reflux disease without esophagitis: Secondary | ICD-10-CM | POA: Diagnosis not present

## 2016-11-21 DIAGNOSIS — I10 Essential (primary) hypertension: Secondary | ICD-10-CM | POA: Diagnosis not present

## 2016-11-23 DIAGNOSIS — F0281 Dementia in other diseases classified elsewhere with behavioral disturbance: Secondary | ICD-10-CM | POA: Diagnosis not present

## 2016-11-23 DIAGNOSIS — I10 Essential (primary) hypertension: Secondary | ICD-10-CM | POA: Diagnosis not present

## 2016-11-23 DIAGNOSIS — K219 Gastro-esophageal reflux disease without esophagitis: Secondary | ICD-10-CM | POA: Diagnosis not present

## 2016-11-23 DIAGNOSIS — I5030 Unspecified diastolic (congestive) heart failure: Secondary | ICD-10-CM | POA: Diagnosis not present

## 2016-11-23 DIAGNOSIS — G3183 Dementia with Lewy bodies: Secondary | ICD-10-CM | POA: Diagnosis not present

## 2016-11-23 DIAGNOSIS — R630 Anorexia: Secondary | ICD-10-CM | POA: Diagnosis not present

## 2016-11-25 DIAGNOSIS — I5030 Unspecified diastolic (congestive) heart failure: Secondary | ICD-10-CM | POA: Diagnosis not present

## 2016-11-25 DIAGNOSIS — G3183 Dementia with Lewy bodies: Secondary | ICD-10-CM | POA: Diagnosis not present

## 2016-11-25 DIAGNOSIS — F0281 Dementia in other diseases classified elsewhere with behavioral disturbance: Secondary | ICD-10-CM | POA: Diagnosis not present

## 2016-11-25 DIAGNOSIS — I10 Essential (primary) hypertension: Secondary | ICD-10-CM | POA: Diagnosis not present

## 2016-11-25 DIAGNOSIS — K219 Gastro-esophageal reflux disease without esophagitis: Secondary | ICD-10-CM | POA: Diagnosis not present

## 2016-11-25 DIAGNOSIS — R630 Anorexia: Secondary | ICD-10-CM | POA: Diagnosis not present

## 2016-11-27 DIAGNOSIS — I10 Essential (primary) hypertension: Secondary | ICD-10-CM | POA: Diagnosis not present

## 2016-11-27 DIAGNOSIS — F0281 Dementia in other diseases classified elsewhere with behavioral disturbance: Secondary | ICD-10-CM | POA: Diagnosis not present

## 2016-11-27 DIAGNOSIS — R630 Anorexia: Secondary | ICD-10-CM | POA: Diagnosis not present

## 2016-11-27 DIAGNOSIS — G3183 Dementia with Lewy bodies: Secondary | ICD-10-CM | POA: Diagnosis not present

## 2016-11-27 DIAGNOSIS — K219 Gastro-esophageal reflux disease without esophagitis: Secondary | ICD-10-CM | POA: Diagnosis not present

## 2016-11-27 DIAGNOSIS — I5030 Unspecified diastolic (congestive) heart failure: Secondary | ICD-10-CM | POA: Diagnosis not present

## 2016-11-28 DIAGNOSIS — G3183 Dementia with Lewy bodies: Secondary | ICD-10-CM | POA: Diagnosis not present

## 2016-11-28 DIAGNOSIS — R630 Anorexia: Secondary | ICD-10-CM | POA: Diagnosis not present

## 2016-11-28 DIAGNOSIS — I5030 Unspecified diastolic (congestive) heart failure: Secondary | ICD-10-CM | POA: Diagnosis not present

## 2016-11-28 DIAGNOSIS — F0281 Dementia in other diseases classified elsewhere with behavioral disturbance: Secondary | ICD-10-CM | POA: Diagnosis not present

## 2016-11-28 DIAGNOSIS — I10 Essential (primary) hypertension: Secondary | ICD-10-CM | POA: Diagnosis not present

## 2016-11-28 DIAGNOSIS — K219 Gastro-esophageal reflux disease without esophagitis: Secondary | ICD-10-CM | POA: Diagnosis not present

## 2016-12-01 DIAGNOSIS — I5030 Unspecified diastolic (congestive) heart failure: Secondary | ICD-10-CM | POA: Diagnosis not present

## 2016-12-01 DIAGNOSIS — K219 Gastro-esophageal reflux disease without esophagitis: Secondary | ICD-10-CM | POA: Diagnosis not present

## 2016-12-01 DIAGNOSIS — I10 Essential (primary) hypertension: Secondary | ICD-10-CM | POA: Diagnosis not present

## 2016-12-01 DIAGNOSIS — F0281 Dementia in other diseases classified elsewhere with behavioral disturbance: Secondary | ICD-10-CM | POA: Diagnosis not present

## 2016-12-01 DIAGNOSIS — R630 Anorexia: Secondary | ICD-10-CM | POA: Diagnosis not present

## 2016-12-01 DIAGNOSIS — G3183 Dementia with Lewy bodies: Secondary | ICD-10-CM | POA: Diagnosis not present

## 2016-12-03 DIAGNOSIS — I5030 Unspecified diastolic (congestive) heart failure: Secondary | ICD-10-CM | POA: Diagnosis not present

## 2016-12-03 DIAGNOSIS — G3183 Dementia with Lewy bodies: Secondary | ICD-10-CM | POA: Diagnosis not present

## 2016-12-03 DIAGNOSIS — F0281 Dementia in other diseases classified elsewhere with behavioral disturbance: Secondary | ICD-10-CM | POA: Diagnosis not present

## 2016-12-03 DIAGNOSIS — K219 Gastro-esophageal reflux disease without esophagitis: Secondary | ICD-10-CM | POA: Diagnosis not present

## 2016-12-03 DIAGNOSIS — I10 Essential (primary) hypertension: Secondary | ICD-10-CM | POA: Diagnosis not present

## 2016-12-03 DIAGNOSIS — R630 Anorexia: Secondary | ICD-10-CM | POA: Diagnosis not present

## 2016-12-04 DIAGNOSIS — G3183 Dementia with Lewy bodies: Secondary | ICD-10-CM | POA: Diagnosis not present

## 2016-12-04 DIAGNOSIS — E785 Hyperlipidemia, unspecified: Secondary | ICD-10-CM | POA: Diagnosis not present

## 2016-12-04 DIAGNOSIS — I10 Essential (primary) hypertension: Secondary | ICD-10-CM | POA: Diagnosis not present

## 2016-12-04 DIAGNOSIS — I5030 Unspecified diastolic (congestive) heart failure: Secondary | ICD-10-CM | POA: Diagnosis not present

## 2016-12-04 DIAGNOSIS — G20 Parkinson's disease: Secondary | ICD-10-CM | POA: Diagnosis not present

## 2016-12-04 DIAGNOSIS — F0281 Dementia in other diseases classified elsewhere with behavioral disturbance: Secondary | ICD-10-CM | POA: Diagnosis not present

## 2016-12-04 DIAGNOSIS — R630 Anorexia: Secondary | ICD-10-CM | POA: Diagnosis not present

## 2016-12-04 DIAGNOSIS — F339 Major depressive disorder, recurrent, unspecified: Secondary | ICD-10-CM | POA: Diagnosis not present

## 2016-12-04 DIAGNOSIS — K219 Gastro-esophageal reflux disease without esophagitis: Secondary | ICD-10-CM | POA: Diagnosis not present

## 2016-12-04 DIAGNOSIS — I672 Cerebral atherosclerosis: Secondary | ICD-10-CM | POA: Diagnosis not present

## 2016-12-04 DIAGNOSIS — N3289 Other specified disorders of bladder: Secondary | ICD-10-CM | POA: Diagnosis not present

## 2016-12-05 DIAGNOSIS — F0281 Dementia in other diseases classified elsewhere with behavioral disturbance: Secondary | ICD-10-CM | POA: Diagnosis not present

## 2016-12-05 DIAGNOSIS — I10 Essential (primary) hypertension: Secondary | ICD-10-CM | POA: Diagnosis not present

## 2016-12-05 DIAGNOSIS — G3183 Dementia with Lewy bodies: Secondary | ICD-10-CM | POA: Diagnosis not present

## 2016-12-05 DIAGNOSIS — K219 Gastro-esophageal reflux disease without esophagitis: Secondary | ICD-10-CM | POA: Diagnosis not present

## 2016-12-05 DIAGNOSIS — I5030 Unspecified diastolic (congestive) heart failure: Secondary | ICD-10-CM | POA: Diagnosis not present

## 2016-12-05 DIAGNOSIS — R630 Anorexia: Secondary | ICD-10-CM | POA: Diagnosis not present

## 2016-12-08 DIAGNOSIS — F0281 Dementia in other diseases classified elsewhere with behavioral disturbance: Secondary | ICD-10-CM | POA: Diagnosis not present

## 2016-12-08 DIAGNOSIS — K219 Gastro-esophageal reflux disease without esophagitis: Secondary | ICD-10-CM | POA: Diagnosis not present

## 2016-12-08 DIAGNOSIS — I10 Essential (primary) hypertension: Secondary | ICD-10-CM | POA: Diagnosis not present

## 2016-12-08 DIAGNOSIS — G3183 Dementia with Lewy bodies: Secondary | ICD-10-CM | POA: Diagnosis not present

## 2016-12-08 DIAGNOSIS — I5030 Unspecified diastolic (congestive) heart failure: Secondary | ICD-10-CM | POA: Diagnosis not present

## 2016-12-08 DIAGNOSIS — R630 Anorexia: Secondary | ICD-10-CM | POA: Diagnosis not present

## 2016-12-10 DIAGNOSIS — I5030 Unspecified diastolic (congestive) heart failure: Secondary | ICD-10-CM | POA: Diagnosis not present

## 2016-12-10 DIAGNOSIS — G3183 Dementia with Lewy bodies: Secondary | ICD-10-CM | POA: Diagnosis not present

## 2016-12-10 DIAGNOSIS — F0281 Dementia in other diseases classified elsewhere with behavioral disturbance: Secondary | ICD-10-CM | POA: Diagnosis not present

## 2016-12-10 DIAGNOSIS — R630 Anorexia: Secondary | ICD-10-CM | POA: Diagnosis not present

## 2016-12-10 DIAGNOSIS — K219 Gastro-esophageal reflux disease without esophagitis: Secondary | ICD-10-CM | POA: Diagnosis not present

## 2016-12-10 DIAGNOSIS — I10 Essential (primary) hypertension: Secondary | ICD-10-CM | POA: Diagnosis not present

## 2016-12-11 ENCOUNTER — Telehealth: Payer: Self-pay | Admitting: *Deleted

## 2016-12-11 DIAGNOSIS — F0281 Dementia in other diseases classified elsewhere with behavioral disturbance: Secondary | ICD-10-CM | POA: Diagnosis not present

## 2016-12-11 DIAGNOSIS — K219 Gastro-esophageal reflux disease without esophagitis: Secondary | ICD-10-CM | POA: Diagnosis not present

## 2016-12-11 DIAGNOSIS — I10 Essential (primary) hypertension: Secondary | ICD-10-CM | POA: Diagnosis not present

## 2016-12-11 DIAGNOSIS — R630 Anorexia: Secondary | ICD-10-CM | POA: Diagnosis not present

## 2016-12-11 DIAGNOSIS — G3183 Dementia with Lewy bodies: Secondary | ICD-10-CM | POA: Diagnosis not present

## 2016-12-11 DIAGNOSIS — I5030 Unspecified diastolic (congestive) heart failure: Secondary | ICD-10-CM | POA: Diagnosis not present

## 2016-12-11 NOTE — Telephone Encounter (Signed)
Selena BattenKim, daughter called and stated that her mother was unable to get the flu shot this year. Patient has around the clock care and the Caregiver's child has been diagnosed with the Flu with a fever of 101.3. Child was prescribed Tamiflu. Child is planning on returning back to school tomorrow. Daughter is wondering when is it safe for the Caregiver to return to patient's home. Caregiver states she is not running a fever and feels fine. Do you think this weekend it ok for her to return. Please Advise.

## 2016-12-12 ENCOUNTER — Other Ambulatory Visit: Payer: Self-pay | Admitting: Internal Medicine

## 2016-12-12 DIAGNOSIS — G3183 Dementia with Lewy bodies: Secondary | ICD-10-CM | POA: Diagnosis not present

## 2016-12-12 DIAGNOSIS — K219 Gastro-esophageal reflux disease without esophagitis: Secondary | ICD-10-CM | POA: Diagnosis not present

## 2016-12-12 DIAGNOSIS — I10 Essential (primary) hypertension: Secondary | ICD-10-CM | POA: Diagnosis not present

## 2016-12-12 DIAGNOSIS — R630 Anorexia: Secondary | ICD-10-CM | POA: Diagnosis not present

## 2016-12-12 DIAGNOSIS — F0281 Dementia in other diseases classified elsewhere with behavioral disturbance: Secondary | ICD-10-CM | POA: Diagnosis not present

## 2016-12-12 DIAGNOSIS — I5030 Unspecified diastolic (congestive) heart failure: Secondary | ICD-10-CM | POA: Diagnosis not present

## 2016-12-12 NOTE — Telephone Encounter (Signed)
Continue alternative coverage through the weekend.  If her regular caregiver does not develop symptoms by Monday, hopefully, she is in the clear.

## 2016-12-12 NOTE — Telephone Encounter (Signed)
Daughter notified and agreed.  

## 2016-12-16 DIAGNOSIS — G3183 Dementia with Lewy bodies: Secondary | ICD-10-CM | POA: Diagnosis not present

## 2016-12-16 DIAGNOSIS — F0281 Dementia in other diseases classified elsewhere with behavioral disturbance: Secondary | ICD-10-CM | POA: Diagnosis not present

## 2016-12-16 DIAGNOSIS — I10 Essential (primary) hypertension: Secondary | ICD-10-CM | POA: Diagnosis not present

## 2016-12-16 DIAGNOSIS — R630 Anorexia: Secondary | ICD-10-CM | POA: Diagnosis not present

## 2016-12-16 DIAGNOSIS — K219 Gastro-esophageal reflux disease without esophagitis: Secondary | ICD-10-CM | POA: Diagnosis not present

## 2016-12-16 DIAGNOSIS — I5030 Unspecified diastolic (congestive) heart failure: Secondary | ICD-10-CM | POA: Diagnosis not present

## 2016-12-17 DIAGNOSIS — I10 Essential (primary) hypertension: Secondary | ICD-10-CM | POA: Diagnosis not present

## 2016-12-17 DIAGNOSIS — K219 Gastro-esophageal reflux disease without esophagitis: Secondary | ICD-10-CM | POA: Diagnosis not present

## 2016-12-17 DIAGNOSIS — R630 Anorexia: Secondary | ICD-10-CM | POA: Diagnosis not present

## 2016-12-17 DIAGNOSIS — G3183 Dementia with Lewy bodies: Secondary | ICD-10-CM | POA: Diagnosis not present

## 2016-12-17 DIAGNOSIS — F0281 Dementia in other diseases classified elsewhere with behavioral disturbance: Secondary | ICD-10-CM | POA: Diagnosis not present

## 2016-12-17 DIAGNOSIS — I5030 Unspecified diastolic (congestive) heart failure: Secondary | ICD-10-CM | POA: Diagnosis not present

## 2016-12-18 DIAGNOSIS — K219 Gastro-esophageal reflux disease without esophagitis: Secondary | ICD-10-CM | POA: Diagnosis not present

## 2016-12-18 DIAGNOSIS — G3183 Dementia with Lewy bodies: Secondary | ICD-10-CM | POA: Diagnosis not present

## 2016-12-18 DIAGNOSIS — R630 Anorexia: Secondary | ICD-10-CM | POA: Diagnosis not present

## 2016-12-18 DIAGNOSIS — F0281 Dementia in other diseases classified elsewhere with behavioral disturbance: Secondary | ICD-10-CM | POA: Diagnosis not present

## 2016-12-18 DIAGNOSIS — I5030 Unspecified diastolic (congestive) heart failure: Secondary | ICD-10-CM | POA: Diagnosis not present

## 2016-12-18 DIAGNOSIS — I10 Essential (primary) hypertension: Secondary | ICD-10-CM | POA: Diagnosis not present

## 2016-12-19 DIAGNOSIS — I5030 Unspecified diastolic (congestive) heart failure: Secondary | ICD-10-CM | POA: Diagnosis not present

## 2016-12-19 DIAGNOSIS — G3183 Dementia with Lewy bodies: Secondary | ICD-10-CM | POA: Diagnosis not present

## 2016-12-19 DIAGNOSIS — I10 Essential (primary) hypertension: Secondary | ICD-10-CM | POA: Diagnosis not present

## 2016-12-19 DIAGNOSIS — F0281 Dementia in other diseases classified elsewhere with behavioral disturbance: Secondary | ICD-10-CM | POA: Diagnosis not present

## 2016-12-19 DIAGNOSIS — R630 Anorexia: Secondary | ICD-10-CM | POA: Diagnosis not present

## 2016-12-19 DIAGNOSIS — K219 Gastro-esophageal reflux disease without esophagitis: Secondary | ICD-10-CM | POA: Diagnosis not present

## 2016-12-21 DIAGNOSIS — R630 Anorexia: Secondary | ICD-10-CM | POA: Diagnosis not present

## 2016-12-21 DIAGNOSIS — F0281 Dementia in other diseases classified elsewhere with behavioral disturbance: Secondary | ICD-10-CM | POA: Diagnosis not present

## 2016-12-21 DIAGNOSIS — G3183 Dementia with Lewy bodies: Secondary | ICD-10-CM | POA: Diagnosis not present

## 2016-12-21 DIAGNOSIS — K219 Gastro-esophageal reflux disease without esophagitis: Secondary | ICD-10-CM | POA: Diagnosis not present

## 2016-12-21 DIAGNOSIS — I5030 Unspecified diastolic (congestive) heart failure: Secondary | ICD-10-CM | POA: Diagnosis not present

## 2016-12-21 DIAGNOSIS — I10 Essential (primary) hypertension: Secondary | ICD-10-CM | POA: Diagnosis not present

## 2016-12-22 DIAGNOSIS — G3183 Dementia with Lewy bodies: Secondary | ICD-10-CM | POA: Diagnosis not present

## 2016-12-22 DIAGNOSIS — I10 Essential (primary) hypertension: Secondary | ICD-10-CM | POA: Diagnosis not present

## 2016-12-22 DIAGNOSIS — K219 Gastro-esophageal reflux disease without esophagitis: Secondary | ICD-10-CM | POA: Diagnosis not present

## 2016-12-22 DIAGNOSIS — R630 Anorexia: Secondary | ICD-10-CM | POA: Diagnosis not present

## 2016-12-22 DIAGNOSIS — F0281 Dementia in other diseases classified elsewhere with behavioral disturbance: Secondary | ICD-10-CM | POA: Diagnosis not present

## 2016-12-22 DIAGNOSIS — I5030 Unspecified diastolic (congestive) heart failure: Secondary | ICD-10-CM | POA: Diagnosis not present

## 2016-12-24 DIAGNOSIS — K219 Gastro-esophageal reflux disease without esophagitis: Secondary | ICD-10-CM | POA: Diagnosis not present

## 2016-12-24 DIAGNOSIS — F0281 Dementia in other diseases classified elsewhere with behavioral disturbance: Secondary | ICD-10-CM | POA: Diagnosis not present

## 2016-12-24 DIAGNOSIS — R630 Anorexia: Secondary | ICD-10-CM | POA: Diagnosis not present

## 2016-12-24 DIAGNOSIS — I5030 Unspecified diastolic (congestive) heart failure: Secondary | ICD-10-CM | POA: Diagnosis not present

## 2016-12-24 DIAGNOSIS — I10 Essential (primary) hypertension: Secondary | ICD-10-CM | POA: Diagnosis not present

## 2016-12-24 DIAGNOSIS — G3183 Dementia with Lewy bodies: Secondary | ICD-10-CM | POA: Diagnosis not present

## 2016-12-25 DIAGNOSIS — G3183 Dementia with Lewy bodies: Secondary | ICD-10-CM | POA: Diagnosis not present

## 2016-12-25 DIAGNOSIS — K219 Gastro-esophageal reflux disease without esophagitis: Secondary | ICD-10-CM | POA: Diagnosis not present

## 2016-12-25 DIAGNOSIS — R630 Anorexia: Secondary | ICD-10-CM | POA: Diagnosis not present

## 2016-12-25 DIAGNOSIS — I10 Essential (primary) hypertension: Secondary | ICD-10-CM | POA: Diagnosis not present

## 2016-12-25 DIAGNOSIS — I5030 Unspecified diastolic (congestive) heart failure: Secondary | ICD-10-CM | POA: Diagnosis not present

## 2016-12-25 DIAGNOSIS — F0281 Dementia in other diseases classified elsewhere with behavioral disturbance: Secondary | ICD-10-CM | POA: Diagnosis not present

## 2016-12-26 DIAGNOSIS — R630 Anorexia: Secondary | ICD-10-CM | POA: Diagnosis not present

## 2016-12-26 DIAGNOSIS — K219 Gastro-esophageal reflux disease without esophagitis: Secondary | ICD-10-CM | POA: Diagnosis not present

## 2016-12-26 DIAGNOSIS — I10 Essential (primary) hypertension: Secondary | ICD-10-CM | POA: Diagnosis not present

## 2016-12-26 DIAGNOSIS — G3183 Dementia with Lewy bodies: Secondary | ICD-10-CM | POA: Diagnosis not present

## 2016-12-26 DIAGNOSIS — I5030 Unspecified diastolic (congestive) heart failure: Secondary | ICD-10-CM | POA: Diagnosis not present

## 2016-12-26 DIAGNOSIS — F0281 Dementia in other diseases classified elsewhere with behavioral disturbance: Secondary | ICD-10-CM | POA: Diagnosis not present

## 2017-01-04 DEATH — deceased

## 2017-02-01 IMAGING — CR DG CHEST 2V
2 series · 2 of 2 positions shown · non-contrast
Comparison: 08/20/2015

CLINICAL DATA: Altered mental status. Generalized weakness and
cough. Difficulty breathing for 5 days.

EXAM:
CHEST  2 VIEW

[w chest lat]
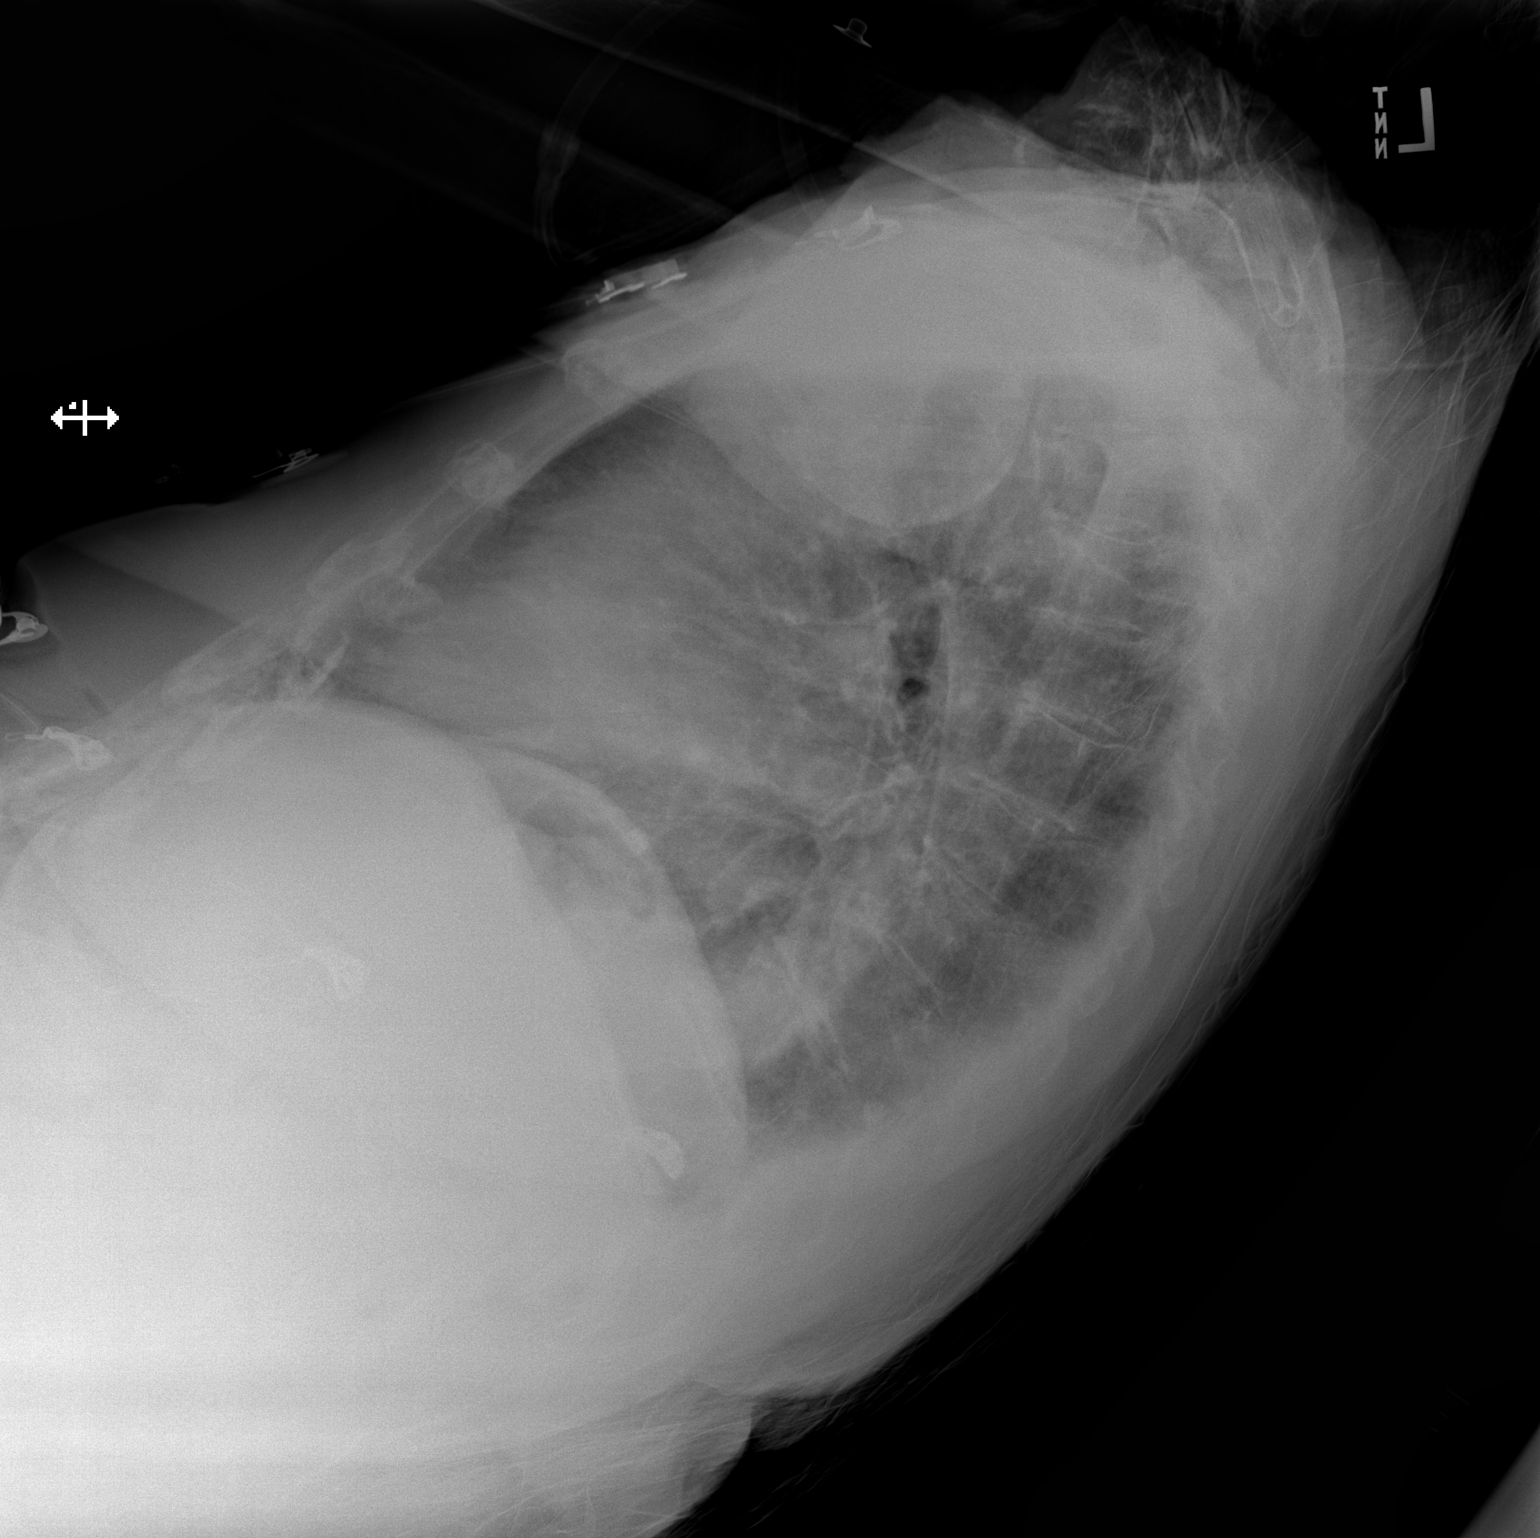

[x chest ap]
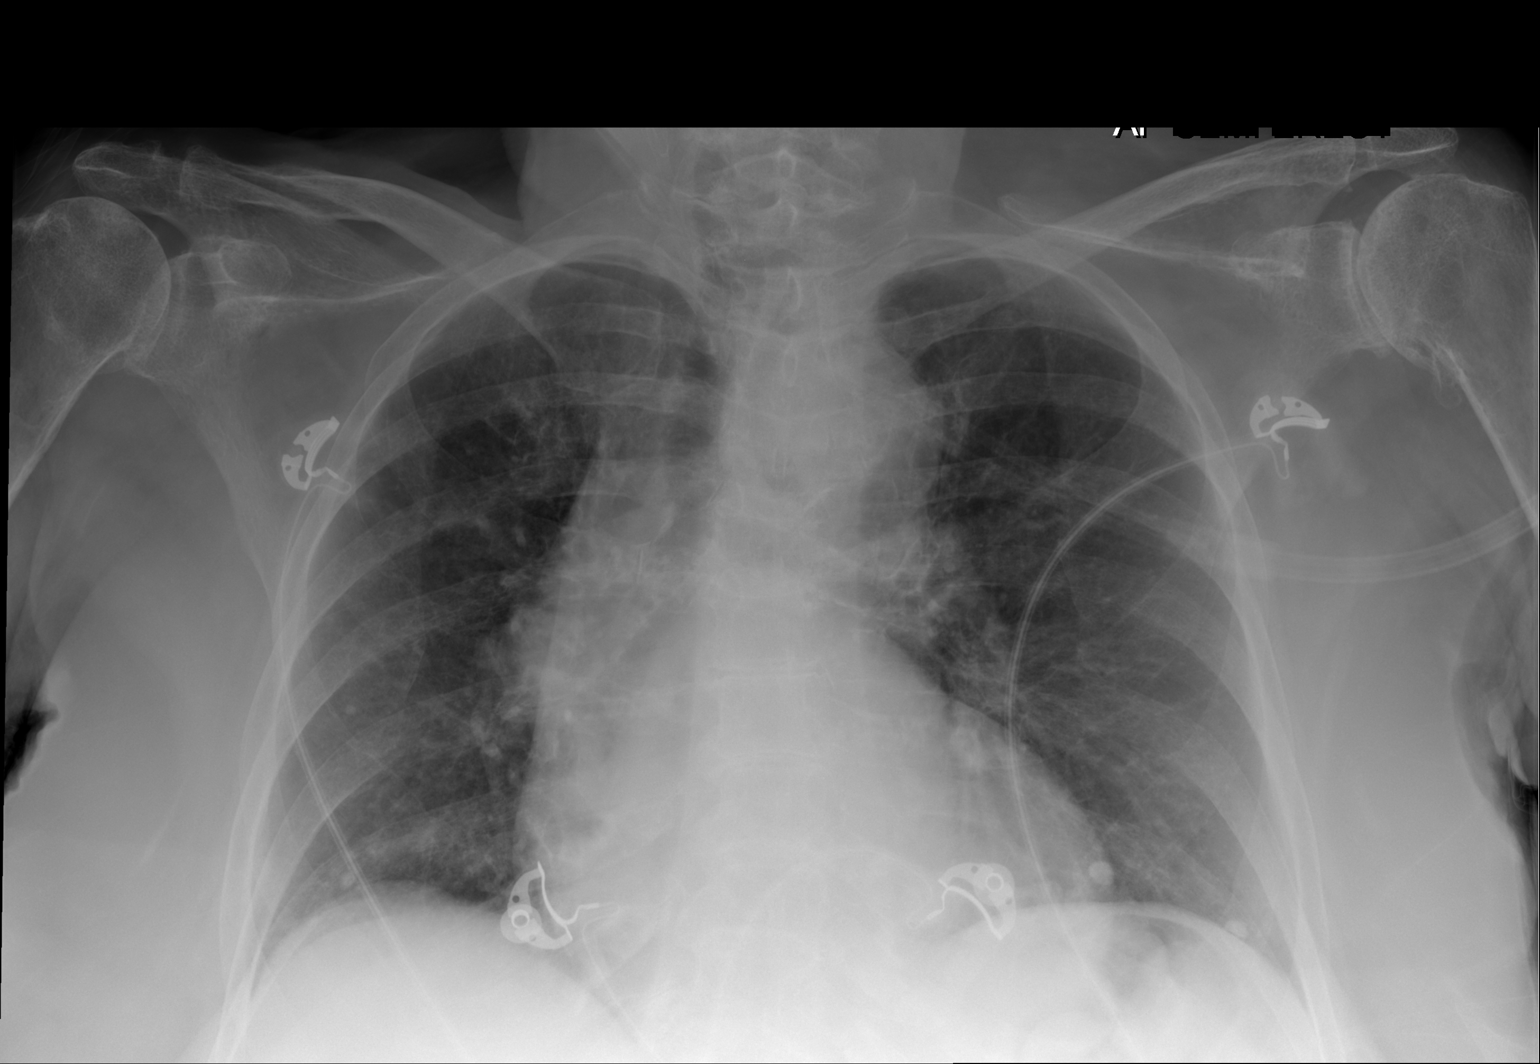

[2 of 2 positions shown; findings below may reference images not displayed]

FINDINGS: Cardiomediastinal contours are unchanged with tortuosity of the
thoracic aorta. Chronic bronchial thickening. No evidence of
pulmonary edema. No confluent airspace disease, pleural effusion or
pneumothorax. Calcified granuloma in both lung bases. No acute
osseous abnormalities are seen. Degenerative change about both
shoulders, left greater than right.
IMPRESSION: Stable chronic bronchitic change. No acute abnormality seen
radiographically.
